# Patient Record
Sex: Male | Born: 1945
Health system: Southern US, Community
[De-identification: ages and names within clinical notes are randomized; demographics above are authoritative.]

## PROBLEM LIST (undated history)

## (undated) DIAGNOSIS — Z9889 Other specified postprocedural states: Secondary | ICD-10-CM

## (undated) DIAGNOSIS — Z860100 Personal history of colon polyps, unspecified: Secondary | ICD-10-CM

## (undated) DIAGNOSIS — I10 Essential (primary) hypertension: Secondary | ICD-10-CM

## (undated) DIAGNOSIS — N400 Enlarged prostate without lower urinary tract symptoms: Secondary | ICD-10-CM

## (undated) DIAGNOSIS — E039 Hypothyroidism, unspecified: Secondary | ICD-10-CM

## (undated) DIAGNOSIS — F419 Anxiety disorder, unspecified: Secondary | ICD-10-CM

## (undated) DIAGNOSIS — E786 Lipoprotein deficiency: Secondary | ICD-10-CM

## (undated) DIAGNOSIS — I4891 Unspecified atrial fibrillation: Secondary | ICD-10-CM

## (undated) DIAGNOSIS — K645 Perianal venous thrombosis: Secondary | ICD-10-CM

## (undated) DIAGNOSIS — N200 Calculus of kidney: Secondary | ICD-10-CM

## (undated) HISTORY — DX: Perianal venous thrombosis: K64.5

## (undated) HISTORY — DX: Unspecified atrial fibrillation: I48.91

## (undated) HISTORY — DX: Anxiety disorder, unspecified: F41.9

## (undated) HISTORY — DX: Benign prostatic hyperplasia without lower urinary tract symptoms: N40.0

## (undated) HISTORY — DX: Personal history of colonic polyps: Z86.010

## (undated) HISTORY — DX: Hypothyroidism, unspecified: E03.9

## (undated) HISTORY — DX: Lipoprotein deficiency: E78.6

## (undated) HISTORY — DX: Calculus of kidney: N20.0

## (undated) HISTORY — DX: Other specified postprocedural states: Z98.890

## (undated) HISTORY — DX: Personal history of colon polyps, unspecified: Z86.0100

## (undated) HISTORY — DX: Essential (primary) hypertension: I10

## (undated) HISTORY — PX: APPENDECTOMY: SHX54

---
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
## 1988-09-07 HISTORY — PX: OTHER SURGICAL HISTORY: SHX169

## 1988-09-07 HISTORY — PX: THYROIDECTOMY, PARTIAL: SHX18

## 1988-09-07 HISTORY — PX: INGUINAL HERNIA REPAIR: SHX194

## 2008-02-03 ENCOUNTER — Encounter: Payer: Self-pay | Admitting: Cardiology

## 2008-02-24 ENCOUNTER — Encounter: Payer: Self-pay | Admitting: Cardiology

## 2008-03-02 ENCOUNTER — Encounter: Payer: Self-pay | Admitting: Cardiology

## 2008-03-08 ENCOUNTER — Encounter: Payer: Self-pay | Admitting: Cardiology

## 2009-05-17 ENCOUNTER — Ambulatory Visit: Payer: Self-pay | Admitting: Internal Medicine

## 2009-05-17 DIAGNOSIS — Z860100 Personal history of colon polyps, unspecified: Secondary | ICD-10-CM | POA: Insufficient documentation

## 2009-05-17 DIAGNOSIS — E039 Hypothyroidism, unspecified: Secondary | ICD-10-CM | POA: Insufficient documentation

## 2009-05-17 DIAGNOSIS — I10 Essential (primary) hypertension: Secondary | ICD-10-CM | POA: Insufficient documentation

## 2009-05-17 DIAGNOSIS — N400 Enlarged prostate without lower urinary tract symptoms: Secondary | ICD-10-CM | POA: Insufficient documentation

## 2009-05-17 DIAGNOSIS — F40243 Fear of flying: Secondary | ICD-10-CM | POA: Insufficient documentation

## 2009-05-17 LAB — CONVERTED CEMR LAB
ALT: 18 units/L (ref 0–53)
Albumin: 3.9 g/dL (ref 3.5–5.2)
Alkaline Phosphatase: 62 units/L (ref 39–117)
BUN: 17 mg/dL (ref 6–23)
Basophils Relative: 0.5 % (ref 0.0–3.0)
CO2: 32 meq/L (ref 19–32)
Calcium: 9.5 mg/dL (ref 8.4–10.5)
GFR calc non Af Amer: 79.96 mL/min (ref >60)
HDL: 29.6 mg/dL — ABNORMAL LOW (ref >39.00)
Hemoglobin: 16.9 g/dL (ref 13.0–17.0)
Lymphocytes Relative: 24.2 % (ref 12.0–46.0)
Monocytes Relative: 10.1 % (ref 3.0–12.0)
Neutrophils Relative %: 61.5 % (ref 43.0–77.0)
PSA: 1.9 ng/mL (ref 0.10–4.00)
RDW: 11.8 % (ref 11.5–14.6)
Specific Gravity, Urine: 1.015 (ref 1.000–1.030)
TSH: 1.33 microintl units/mL (ref 0.35–5.50)
Total CHOL/HDL Ratio: 5
Total Protein: 7.1 g/dL (ref 6.0–8.3)
Triglycerides: 135 mg/dL (ref 0.0–149.0)
pH: 7 (ref 5.0–8.0)

## 2009-05-20 ENCOUNTER — Encounter: Payer: Self-pay | Admitting: Internal Medicine

## 2009-05-27 ENCOUNTER — Ambulatory Visit: Payer: Self-pay | Admitting: Internal Medicine

## 2009-07-27 ENCOUNTER — Encounter: Payer: Self-pay | Admitting: Internal Medicine

## 2009-08-06 ENCOUNTER — Ambulatory Visit: Payer: Self-pay | Admitting: Internal Medicine

## 2009-08-06 DIAGNOSIS — K645 Perianal venous thrombosis: Secondary | ICD-10-CM | POA: Insufficient documentation

## 2009-09-12 ENCOUNTER — Encounter (INDEPENDENT_AMBULATORY_CARE_PROVIDER_SITE_OTHER): Payer: Self-pay | Admitting: *Deleted

## 2009-09-13 ENCOUNTER — Ambulatory Visit: Payer: Self-pay | Admitting: Gastroenterology

## 2009-10-10 ENCOUNTER — Telehealth: Payer: Self-pay | Admitting: Gastroenterology

## 2009-10-22 ENCOUNTER — Ambulatory Visit: Payer: Self-pay | Admitting: Internal Medicine

## 2009-10-25 ENCOUNTER — Ambulatory Visit: Payer: Self-pay

## 2009-10-25 ENCOUNTER — Encounter: Payer: Self-pay | Admitting: Internal Medicine

## 2009-11-04 ENCOUNTER — Telehealth (INDEPENDENT_AMBULATORY_CARE_PROVIDER_SITE_OTHER): Payer: Self-pay | Admitting: *Deleted

## 2009-11-05 ENCOUNTER — Ambulatory Visit: Payer: Self-pay | Admitting: Gastroenterology

## 2009-11-05 LAB — HM COLONOSCOPY

## 2009-11-08 ENCOUNTER — Encounter: Payer: Self-pay | Admitting: Gastroenterology

## 2009-12-02 ENCOUNTER — Telehealth (INDEPENDENT_AMBULATORY_CARE_PROVIDER_SITE_OTHER): Payer: Self-pay | Admitting: *Deleted

## 2009-12-03 ENCOUNTER — Ambulatory Visit: Payer: Self-pay | Admitting: Internal Medicine

## 2009-12-03 DIAGNOSIS — I480 Paroxysmal atrial fibrillation: Secondary | ICD-10-CM | POA: Insufficient documentation

## 2009-12-03 LAB — CONVERTED CEMR LAB
Specific Gravity, Urine: 1.02 (ref 1.000–1.030)
TSH: 1.56 microintl units/mL (ref 0.35–5.50)
Urobilinogen, UA: 0.2 (ref 0.0–1.0)
pH: 7 (ref 5.0–8.0)

## 2009-12-31 ENCOUNTER — Ambulatory Visit: Payer: Self-pay | Admitting: Cardiology

## 2009-12-31 ENCOUNTER — Telehealth (INDEPENDENT_AMBULATORY_CARE_PROVIDER_SITE_OTHER): Payer: Self-pay | Admitting: *Deleted

## 2009-12-31 DIAGNOSIS — F17200 Nicotine dependence, unspecified, uncomplicated: Secondary | ICD-10-CM | POA: Insufficient documentation

## 2010-01-01 ENCOUNTER — Telehealth (INDEPENDENT_AMBULATORY_CARE_PROVIDER_SITE_OTHER): Payer: Self-pay | Admitting: *Deleted

## 2010-01-09 ENCOUNTER — Telehealth (INDEPENDENT_AMBULATORY_CARE_PROVIDER_SITE_OTHER): Payer: Self-pay | Admitting: *Deleted

## 2010-01-13 ENCOUNTER — Telehealth: Payer: Self-pay | Admitting: Cardiology

## 2010-01-16 ENCOUNTER — Ambulatory Visit: Payer: Self-pay

## 2010-01-16 ENCOUNTER — Ambulatory Visit (HOSPITAL_COMMUNITY): Admission: RE | Admit: 2010-01-16 | Discharge: 2010-01-16 | Payer: Self-pay | Admitting: Cardiology

## 2010-01-16 ENCOUNTER — Encounter: Payer: Self-pay | Admitting: Cardiology

## 2010-01-16 ENCOUNTER — Ambulatory Visit: Payer: Self-pay | Admitting: Internal Medicine

## 2010-01-17 ENCOUNTER — Ambulatory Visit: Payer: Self-pay | Admitting: Cardiology

## 2010-02-06 ENCOUNTER — Ambulatory Visit: Payer: Self-pay | Admitting: Internal Medicine

## 2010-02-06 LAB — CONVERTED CEMR LAB
Calcium: 9.1 mg/dL (ref 8.4–10.5)
Chloride: 107 meq/L (ref 96–112)
Eosinophils Relative: 4.9 % (ref 0.0–5.0)
GFR calc non Af Amer: 93.69 mL/min (ref >60)
Lymphs Abs: 1.9 10*3/uL (ref 0.7–4.0)
MCHC: 35.2 g/dL (ref 30.0–36.0)
Monocytes Relative: 8.6 % (ref 3.0–12.0)
Neutro Abs: 4 10*3/uL (ref 1.4–7.7)
Neutrophils Relative %: 58.4 % (ref 43.0–77.0)
Platelets: 195 10*3/uL (ref 150.0–400.0)
Potassium: 3.9 meq/L (ref 3.5–5.1)
TSH: 1.51 microintl units/mL (ref 0.35–5.50)
WBC: 6.9 10*3/uL (ref 4.5–10.5)

## 2010-02-12 ENCOUNTER — Ambulatory Visit (HOSPITAL_COMMUNITY): Admission: RE | Admit: 2010-02-12 | Discharge: 2010-02-12 | Payer: Self-pay | Admitting: Cardiology

## 2010-02-12 ENCOUNTER — Ambulatory Visit: Payer: Self-pay | Admitting: Cardiovascular Disease

## 2010-02-12 ENCOUNTER — Ambulatory Visit: Payer: Self-pay

## 2010-02-12 ENCOUNTER — Encounter: Payer: Self-pay | Admitting: Cardiology

## 2010-02-25 ENCOUNTER — Telehealth: Payer: Self-pay | Admitting: Cardiology

## 2010-04-14 ENCOUNTER — Ambulatory Visit: Payer: Self-pay | Admitting: Cardiology

## 2010-04-14 DIAGNOSIS — I498 Other specified cardiac arrhythmias: Secondary | ICD-10-CM | POA: Insufficient documentation

## 2010-04-21 ENCOUNTER — Ambulatory Visit: Payer: Self-pay | Admitting: Cardiology

## 2010-04-22 LAB — CONVERTED CEMR LAB
BUN: 17 mg/dL (ref 6–23)
Chloride: 103 meq/L (ref 96–112)
Creatinine, Ser: 1 mg/dL (ref 0.4–1.5)
Potassium: 3.8 meq/L (ref 3.5–5.1)

## 2010-06-24 ENCOUNTER — Telehealth: Payer: Self-pay | Admitting: Internal Medicine

## 2010-07-11 ENCOUNTER — Ambulatory Visit: Payer: Self-pay | Admitting: Internal Medicine

## 2010-07-11 DIAGNOSIS — R7303 Prediabetes: Secondary | ICD-10-CM | POA: Insufficient documentation

## 2010-07-11 LAB — CONVERTED CEMR LAB
ALT: 20 units/L (ref 0–53)
AST: 23 units/L (ref 0–37)
Alkaline Phosphatase: 67 units/L (ref 39–117)
Basophils Absolute: 0 10*3/uL (ref 0.0–0.1)
Bilirubin, Direct: 0.1 mg/dL (ref 0.0–0.3)
CO2: 31 meq/L (ref 19–32)
Calcium: 9.2 mg/dL (ref 8.4–10.5)
Glucose, Bld: 123 mg/dL — ABNORMAL HIGH (ref 70–99)
HCT: 50.5 % (ref 39.0–52.0)
Hemoglobin: 17.6 g/dL — ABNORMAL HIGH (ref 13.0–17.0)
Lymphocytes Relative: 21.7 % (ref 12.0–46.0)
Lymphs Abs: 1.8 10*3/uL (ref 0.7–4.0)
MCV: 91.9 fL (ref 78.0–100.0)
Monocytes Relative: 10.2 % (ref 3.0–12.0)
Neutro Abs: 5.4 10*3/uL (ref 1.4–7.7)
RBC: 5.49 M/uL (ref 4.22–5.81)
Sodium: 140 meq/L (ref 135–145)
Total Bilirubin: 1.3 mg/dL — ABNORMAL HIGH (ref 0.3–1.2)

## 2010-07-14 ENCOUNTER — Telehealth: Payer: Self-pay | Admitting: Internal Medicine

## 2010-07-14 ENCOUNTER — Ambulatory Visit: Payer: Self-pay | Admitting: Internal Medicine

## 2010-07-16 ENCOUNTER — Ambulatory Visit: Payer: Self-pay | Admitting: Internal Medicine

## 2010-08-11 ENCOUNTER — Ambulatory Visit: Payer: Self-pay | Admitting: Internal Medicine

## 2010-08-22 ENCOUNTER — Encounter: Payer: Self-pay | Admitting: Internal Medicine

## 2010-08-22 LAB — CONVERTED CEMR LAB
BUN: 18 mg/dL (ref 6–23)
Chloride: 107 meq/L (ref 96–112)
Cholesterol: 146 mg/dL (ref 0–200)
LDL Cholesterol: 93 mg/dL (ref 0–99)
Potassium: 4.6 meq/L (ref 3.5–5.1)
Sodium: 141 meq/L (ref 135–145)
Total CHOL/HDL Ratio: 5

## 2010-10-07 NOTE — Letter (Signed)
Summary: Results Follow-up Letter  [facility]  [address] [place], [place] [address]   Phone: [phone]  Fax: [phone]    07/11/2010  [address] [place], [place]  [address]  Dear Mr. SERVISS,   The following are the results of your recent test(s):  Test     Result     Blood sugars   early type II diabetes Liver/kidney   normal Thyroid     normal CBC       normal   _________________________________________________________  Please call for an appointment soon _________________________________________________________ _________________________________________________________ _________________________________________________________  Sincerely,  Sanda Linger MD [facility]

## 2010-10-07 NOTE — Progress Notes (Signed)
  Phone Note Other Incoming   Caller: Merita Norton, [facility] [address] Request: Send information Summary of Call: Received a call from [place], [facility] inquiring about our process for record retrieval from other facilities. I advised her of the process after which she asked if I could get the records. If I couldn't she would get Selena Batten to do it. I advised her that I would take care of it. I called the Doctor's office; however, the medical record department closed at 4.  I have spoken to both [place] and [facility] about this.      Appended Document:  I called the [facility] at [phone] and left a message this morning for a return call.  Appended Document:  Received a return call from [facility]. She stated that after checking the patient's EMR, she does not show that the release was ever received so it would not have been processed. I have refaxed the release form to [phone] per the request of Joann. Chyrl Civatte states that she can only send the last 3 ov's and labs.

## 2010-10-07 NOTE — Letter (Signed)
Summary: Patient Notice- Polyp Results  [facility]  [address] [place], [place] [address]   Phone: [phone]  Fax: [phone]        November 08, 2009 MRN: [id number]    Kevin Bradley [address] [place], [place]  [address]    Dear [name],  I am pleased to inform you that the colon polyp(s) removed during your recent colonoscopy was (were) found to be benign (no cancer detected) upon pathologic examination.  I recommend you have a repeat colonoscopy examination in 5_ years to look for recurrent polyps, as having colon polyps increases your risk for having recurrent polyps or even colon cancer in the future.  Should you develop new or worsening symptoms of abdominal pain, bowel habit changes or bleeding from the rectum or bowels, please schedule an evaluation with either your primary care physician or with me.  Additional information/recommendations:  __ No further action with gastroenterology is needed at this time. Please      follow-up with your primary care physician for your other healthcare      needs.  __ Please call [phone] to schedule a return visit to review your      situation.  __ Please keep your follow-up visit as already scheduled.  _x_ Continue treatment plan as outlined the day of your exam.  Please call us if you are having persistent problems or have questions about your condition that have not been fully answered at this time.  Sincerely,  Louis Meckel MD  This letter has been electronically signed by your physician.  Appended Document: Patient Notice- Polyp Results Letter mailed 3.7.11

## 2010-10-07 NOTE — Progress Notes (Signed)
Summary: Records received from Dr. Remus Blake  Records received from Dr. Carney Bern, [facility]. Forwarded to Dr. Yetta Barre for review. Kevin Bradley  January 01, 2010 12:29 PM

## 2010-10-07 NOTE — Letter (Signed)
Summary: Primary Physician Clearance/[facility]  Primary Physician Clearance/[facility]   Imported By: Sherian Rein 07/17/2010 10:46:32  _____________________________________________________________________  External Attachment:    Type:   Image     Comment:   External Document

## 2010-10-07 NOTE — Progress Notes (Signed)
  Faxed all Cardiac over to Joycelyn Schmid w/ [employer] to fax [phone] [facility]  December 02, 2009 9:40 AM

## 2010-10-07 NOTE — Assessment & Plan Note (Signed)
Summary: f/u app per wife/cd   Vital Signs:  Patient profile:   64 year old male Height:      72 inches Weight:      199 pounds BMI:     27.09 O2 Sat:      96 % on Room air Temp:     97.0 degrees F oral Pulse rate:   48 / minute Pulse rhythm:   regular Resp:     16 per minute BP sitting:   140 / 86  (left arm) Cuff size:   large  Vitals Entered By: Rock Nephew CMA (December 03, 2009 8:23 AM)  Nutrition Counseling: Patient's BMI is greater than 25 and therefore counseled on weight management options.  O2 Flow:  Room air CC: follow-up visit// discuss test results Is Patient Diabetic? No Pain Assessment Patient in pain? no        Primary Care Provider:  Etta Grandchild MD  CC:  follow-up visit// discuss test results.  History of Present Illness: 1. he returns for f/up on palpitations, he had an event monitor read on 10/25/09 that showed some A. fib., this report was not copied to me, he still has "skipped and irregular beats" but no CP, SOB, DOE, near-syncope, or edema. His palpitations have not recently worsened.  2. he has a 3 week hx. of urinary frequency  Preventive Screening-Counseling & Management  Alcohol-Tobacco     Alcohol drinks/day: 0     Smoking Status: current     Smoking Cessation Counseling: yes     Cigars/week: 14  Hep-HIV-STD-Contraception     Hepatitis Risk: no risk noted     HIV Risk: no risk noted     STD Risk: no risk noted      Sexual History:  currently monogamous.        Drug Use:  never.        Blood Transfusions:  no.    Clinical Review Panels:  Prevention   Last Colonoscopy:  DONE (11/05/2009)   Last PSA:  1.90 (05/17/2009)   Medications Prior to Update: 1)  Tiazac 180 Mg Xr24h-Cap (Diltiazem Hcl Er Beads) .... Take 1 Tablet By Mouth Once A Day 2)  Atenolol 50 Mg Tabs (Atenolol) .... Take 1/2  Tablet By Mouth Two Times A Day 3)  Synthroid 50 Mcg Tabs (Levothyroxine Sodium) .... Take 1 Tablet By Mouth Once A Day 4)  Fish  Oil 5)  Vitamin D .... 2 Qd 6)  Asa 81mg  .... Take 1 Tablet By Mouth Once A Day 7)  Tussionex Pennkinetic Er 8-10 Mg/35ml Lqcr (Chlorpheniramine-Hydrocodone) .... 5 Ml By Mouth Two Times A Day As Needed For Cough  Current Medications (verified): 1)  Tiazac 180 Mg Xr24h-Cap (Diltiazem Hcl Er Beads) .... Take 1 Tablet By Mouth Once A Day 2)  Atenolol 50 Mg Tabs (Atenolol) .... Take 1/2  Tablet By Mouth Two Times A Day 3)  Synthroid 50 Mcg Tabs (Levothyroxine Sodium) .... Take 1 Tablet By Mouth Once A Day 4)  Fish Oil 5)  Vitamin D .... 2 Qd 6)  Asa 81mg  .... Take 1 Tablet By Mouth Once A Day 7)  Ciprofloxacin Hcl 500 Mg Tabs (Ciprofloxacin Hcl) .... One By Mouth Two Times A Day For 30 Days  Allergies (verified): 1)  ! Penicillin  Past History:  Past Medical History: Reviewed history from 10/22/2009 and no changes required. Irregular heart rate with a normal ETT one year ago in [place] Anxiety Hypothyroidism Benign prostatic hypertrophy Hypertension  Colonic polyps, hx of  Past Surgical History: Reviewed history from 05/17/2009 and no changes required. Inguinal herniorrhaphy Tonsillectomy  Family History: Reviewed history from 05/17/2009 and no changes required. Family History of Alcoholism/Addiction Family History Breast cancer 1st degree relative <50  Social History: Reviewed history from 05/17/2009 and no changes required. Occupation: truck Hospital doctor Married Current Smoker Alcohol use-yes Drug use-no Regular exercise-yes Hepatitis Risk:  no risk noted HIV Risk:  no risk noted STD Risk:  no risk noted Sexual History:  currently monogamous Drug Use:  never Blood Transfusions:  no  Review of Systems  The patient denies anorexia, fever, weight loss, weight gain, chest pain, peripheral edema, prolonged cough, headaches, hemoptysis, abdominal pain, hematuria, incontinence, genital sores, suspicious skin lesions, depression, enlarged lymph nodes, and testicular masses.    CV:  Complains of palpitations; denies chest pain or discomfort, difficulty breathing at night, fainting, fatigue, leg cramps with exertion, near fainting, shortness of breath with exertion, swelling of feet, swelling of hands, and weight gain. GU:  Complains of nocturia and urinary frequency; denies dysuria, erectile dysfunction, genital sores, hematuria, incontinence, and urinary hesitancy.  Physical Exam  General:  alert, well-developed, well-nourished, well-hydrated, appropriate dress, normal appearance, healthy-appearing, and cooperative to examination.   Mouth:  Oral mucosa and oropharynx without lesions or exudates.  Teeth in good repair. Neck:  supple, full ROM, no masses, no thyromegaly, normal carotid upstroke, and no carotid bruits.   Lungs:  Normal respiratory effort, chest expands symmetrically. Lungs are clear to auscultation, no crackles or wheezes. Heart:  regular rhythm, no murmur, no gallop, no rub, no JVD, and bradycardia.   Abdomen:  Bowel sounds positive,abdomen soft and non-tender without masses, organomegaly or hernias noted. abdominal scar(s).   Rectal:  external hemorrhoid,  right lateral area with thrombosis.  normal sphincter tone, no masses, no tenderness, no fissures, no fistulae, and external hemorrhoid(s).  heme negative stool. Genitalia:  circumcised, no hydrocele, no varicocele, no scrotal masses, no testicular masses or atrophy, no cutaneous lesions, and no urethral discharge.   Prostate:  no nodules, no asymmetry, no induration, boggy, and 1+ enlarged.   Msk:  normal ROM, no joint tenderness, no joint swelling, no joint warmth, no redness over joints, and no joint deformities.   Pulses:  R and L carotid,radial,femoral,dorsalis pedis and posterior tibial pulses are full and equal bilaterally Extremities:  No clubbing, cyanosis, edema, or deformity noted with normal full range of motion of all joints.   Neurologic:  alert & oriented X3, cranial nerves II-XII  intact, strength normal in all extremities, sensation intact to light touch, sensation intact to pinprick, gait normal, DTRs symmetrical and normal, and finger-to-nose normal.   Skin:  Intact without suspicious lesions or rashes Cervical Nodes:  no anterior cervical adenopathy and no posterior cervical adenopathy.   Axillary Nodes:  no R axillary adenopathy and no L axillary adenopathy.   Inguinal Nodes:  no R inguinal adenopathy and no L inguinal adenopathy.   Psych:  Cognition and judgment appear intact. Alert and cooperative with normal attention span and concentration. No apparent delusions, illusions, hallucinations Additional Exam:  EKG shows a HR=46 with 1st degree AV block and a flat T in I.   Impression & Recommendations:  Problem # 1:  PROSTATITIS, ACUTE (ICD-601.0) Assessment New start Cipro  Problem # 2:  FREQUENCY, URINARY (ICD-788.41) Assessment: New will look for RBC's, WBC's, protein, etc. Orders: TLB-Udip w/ Micro (81001-URINE)  Problem # 3:  ATRIAL FIBRILLATION (ICD-427.31) Assessment: New  His updated  medication list for this problem includes:    Tiazac 180 Mg Xr24h-cap (Diltiazem hcl er beads) .Marland Kitchen... Take 1 tablet by mouth once a day    Atenolol 50 Mg Tabs (Atenolol) .Marland Kitchen... Take 1/2  tablet by mouth two times a day  Orders: Cardiology Referral (Cardiology) EKG w/ Interpretation (93000)  Problem # 4:  HYPOTHYROIDISM (ICD-244.9) Assessment: Unchanged  His updated medication list for this problem includes:    Synthroid 50 Mcg Tabs (Levothyroxine sodium) .Marland Kitchen... Take 1 tablet by mouth once a day  Orders: Venipuncture ([address]) TLB-TSH (Thyroid Stimulating Hormone) (84443-TSH)  Labs Reviewed: TSH: 1.33 (05/17/2009)    Chol: 155 (05/17/2009)   HDL: 29.60 (05/17/2009)   LDL: 98 (05/17/2009)   TG: 135.0 (05/17/2009)  Problem # 5:  HYPERTENSION (ICD-401.9) Assessment: Unchanged  His updated medication list for this problem includes:    Tiazac 180 Mg Xr24h-cap  (Diltiazem hcl er beads) .Marland Kitchen... Take 1 tablet by mouth once a day    Atenolol 50 Mg Tabs (Atenolol) .Marland Kitchen... Take 1/2  tablet by mouth two times a day  BP today: 140/86 Prior BP: 138/80 (10/22/2009)  Labs Reviewed: K+: 4.3 (05/17/2009) Creat: : 1.0 (05/17/2009)   Chol: 155 (05/17/2009)   HDL: 29.60 (05/17/2009)   LDL: 98 (05/17/2009)   TG: 135.0 (05/17/2009)  Complete Medication List: 1)  Tiazac 180 Mg Xr24h-cap (Diltiazem hcl er beads) .... Take 1 tablet by mouth once a day 2)  Atenolol 50 Mg Tabs (Atenolol) .... Take 1/2  tablet by mouth two times a day 3)  Synthroid 50 Mcg Tabs (Levothyroxine sodium) .... Take 1 tablet by mouth once a day 4)  Fish Oil  5)  Vitamin D  .... 2 qd 6)  Asa 81mg   .... Take 1 tablet by mouth once a day 7)  Ciprofloxacin Hcl 500 Mg Tabs (Ciprofloxacin hcl) .... One by mouth two times a day for 30 days  Patient Instructions: 1)  Please schedule a follow-up appointment in 1 month. 2)  Take your antibiotic as prescribed until ALL of it is gone, but stop if you develop a rash or swelling and contact our office as soon as possible. Prescriptions: CIPROFLOXACIN HCL 500 MG TABS (CIPROFLOXACIN HCL) One by mouth two times a day for 30 days  #60 x 1   Entered and Authorized by:   Etta Grandchild MD   Signed by:   Etta Grandchild MD on 12/03/2009   Method used:   Electronically to        [facility] [address] [phone]* (retail)       [address]       [place], [place]  [address]       Ph: [phone] or [phone]       Fax: [phone]   RxID:   [id number]    Immunization History:  Influenza Immunization History:    Influenza:  historical (06/07/2009)

## 2010-10-07 NOTE — Assessment & Plan Note (Signed)
Summary: dot wants bp monitored and clearance signed-lb   Vital Signs:  Patient profile:   65 year old male Height:      72 inches Weight:      202 pounds BMI:     27.50 O2 Sat:      95 % on Room air Temp:     98.5 degrees F oral Pulse rate:   55 / minute Pulse rhythm:   regular Resp:     16 per minute BP sitting:   156 / 92  (left arm) Cuff size:   large  Vitals Entered By: Rock Nephew CMA (July 11, 2010 3:40 PM)  Nutrition Counseling: Patient's BMI is greater than 25 and therefore counseled on weight management options.  O2 Flow:  Room air CC: follow-up visit//discuss BP Is Patient Diabetic? No Pain Assessment Patient in pain? no       Does patient need assistance? Functional Status Self care Ambulation Normal   Primary Care Provider:  Etta Grandchild MD  CC:  follow-up visit//discuss BP.  History of Present Illness:  Hypertension Follow-Up      This is a 65 year old man who presents for Hypertension follow-up.  The patient denies lightheadedness, urinary frequency, headaches, and edema.  The patient denies the following associated symptoms: chest pain, chest pressure, exercise intolerance, dyspnea, palpitations, syncope, leg edema, and pedal edema.  Compliance with medications (by patient report) has been near 100%.  The patient reports that dietary compliance has been fair.  The patient reports no exercise.  Adjunctive measures currently used by the patient include salt restriction and relaxation.    Preventive Screening-Counseling & Management  Alcohol-Tobacco     Alcohol drinks/day: 0     Alcohol Counseling: not indicated; patient does not drink     Smoking Status: current     Smoking Cessation Counseling: yes     Smoke Cessation Stage: precontemplative     Packs/Day: 0.75     Cigars/week: 14     Tobacco Counseling: to quit use of tobacco products  Hep-HIV-STD-Contraception     Hepatitis Risk: no risk noted     HIV Risk: no risk noted     STD  Risk: no risk noted      Sexual History:  currently monogamous.        Drug Use:  never.        Blood Transfusions:  no.    Clinical Review Panels:  Prevention   Last Colonoscopy:  DONE (11/05/2009)   Last PSA:  1.90 (05/17/2009)  Immunizations   Last Flu Vaccine:  Historical (06/07/2009)  Lipid Management   Cholesterol:  155 (05/17/2009)   LDL (bad choesterol):  98 (05/17/2009)   HDL (good cholesterol):  29.60 (05/17/2009)  Diabetes Management   Creatinine:  1.0 (04/21/2010)   Last Flu Vaccine:  Historical (06/07/2009)  CBC   WBC:  6.9 (02/06/2010)   RBC:  5.29 (02/06/2010)   Hgb:  17.0 (02/06/2010)   Hct:  48.2 (02/06/2010)   Platelets:  195.0 (02/06/2010)   MCV  91.1 (02/06/2010)   MCHC  35.2 (02/06/2010)   RDW  12.7 (02/06/2010)   PMN:  58.4 (02/06/2010)   Lymphs:  27.5 (02/06/2010)   Monos:  8.6 (02/06/2010)   Eosinophils:  4.9 (02/06/2010)   Basophil:  0.6 (02/06/2010)  Complete Metabolic Panel   Glucose:  112 (04/21/2010)   Sodium:  141 (04/21/2010)   Potassium:  3.8 (04/21/2010)   Chloride:  103 (04/21/2010)   CO2:  27 (04/21/2010)   BUN:  17 (04/21/2010)   Creatinine:  1.0 (04/21/2010)   Albumin:  3.9 (05/17/2009)   Total Protein:  7.1 (05/17/2009)   Calcium:  9.2 (04/21/2010)   Total Bili:  1.2 (05/17/2009)   Alk Phos:  62 (05/17/2009)   SGPT (ALT):  18 (05/17/2009)   SGOT (AST):  17 (05/17/2009)   Medications Prior to Update: 1)  Tiazac 180 Mg Xr24h-Cap (Diltiazem Hcl Er Beads) .... Take 1 Tablet By Mouth Once A Day 2)  Atenolol 50 Mg Tabs (Atenolol) .... Take 1 Tablet By Mouth Once Daily 3)  Synthroid 50 Mcg Tabs (Levothyroxine Sodium) .... Take 1 Tablet By Mouth Once A Day 4)  Fish Oil   Oil (Fish Oil) .Marland Kitchen.. 1  Tab By Mouth Once Daily 5)  Vitamin D .... 2 Qd 6)  Aspirin Ec 325 Mg Tbec (Aspirin) .... Take One Tablet By Mouth Daily 7)  Tambocor 50 Mg Tabs (Flecainide Acetate) .... 2 Tab By Mouth Once Daily 8)  Lisinopril 10 Mg Tabs  (Lisinopril) .... Take One Tablet By Mouth Daily  Current Medications (verified): 1)  Tiazac 180 Mg Xr24h-Cap (Diltiazem Hcl Er Beads) .... Take 1 Tablet By Mouth Once A Day 2)  Synthroid 50 Mcg Tabs (Levothyroxine Sodium) .... Take 1 Tablet By Mouth Once A Day 3)  Fish Oil   Oil (Fish Oil) .Marland Kitchen.. 1  Tab By Mouth Once Daily 4)  Vitamin D .... 2 Qd 5)  Aspirin Ec 325 Mg Tbec (Aspirin) .... Take One Tablet By Mouth Daily 6)  Tambocor 50 Mg Tabs (Flecainide Acetate) .... 2 Tab By Mouth Once Daily 7)  Bystolic 10 Mg Tabs (Nebivolol Hcl) .... One By Mouth Once Daily For High Blood Pressure 8)  Diovan 160 Mg Tabs (Valsartan) .... One By Mouth Once Daily For High Blood Pressure  Allergies (verified): 1)  ! Penicillin  Past History:  Past Medical History: Last updated: 12/31/2009 ATRIAL FIBRILLATION (ICD-427.31) HYPERTENSION (ICD-401.9) EXTERNAL THROMBOSED HEMORRHOIDS (ICD-455.4) COLONIC POLYPS, HX OF (ICD-V12.72) BENIGN PROSTATIC HYPERTROPHY (ICD-600.00) HYPOTHYROIDISM (ICD-244.9) ANXIETY (ICD-300.00) Low HDL Nephrolithiasis  Past Surgical History: Last updated: 12/31/2009 Inguinal herniorrhaphy Tonsillectomy Partial thyroidectomy Appendectomy  Family History: Last updated: 12/31/2009 Family History of Alcoholism/Addiction Family History Breast cancer 1st degree relative <50 Father died of AAA; MI at age 48  Social History: Last updated: 12/31/2009 Occupation: truck driver Married Current Smoker - 2 cigars per day Alcohol use-none Drug use-no Regular exercise-yes  Risk Factors: Alcohol Use: 0 (07/11/2010) Exercise: yes (05/17/2009)  Risk Factors: Smoking Status: current (07/11/2010) Packs/Day: 0.75 (07/11/2010) Cigars/wk: 14 (07/11/2010)  Family History: Reviewed history from 12/31/2009 and no changes required. Family History of Alcoholism/Addiction Family History Breast cancer 1st degree relative <50 Father died of AAA; MI at age 72  Social History: Reviewed  history from 12/31/2009 and no changes required. Occupation: truck Hospital doctor Married Current Smoker - 2 cigars per day Alcohol use-none Drug use-no Regular exercise-yes  Review of Systems       The patient complains of weight gain.  The patient denies anorexia, fever, weight loss, chest pain, syncope, dyspnea on exertion, peripheral edema, prolonged cough, headaches, hemoptysis, abdominal pain, hematuria, suspicious skin lesions, difficulty walking, and depression.   CV:  Denies chest pain or discomfort, fainting, fatigue, leg cramps with exertion, lightheadness, near fainting, palpitations, shortness of breath with exertion, and swelling of feet. Endo:  Denies cold intolerance, excessive hunger, excessive thirst, excessive urination, heat intolerance, polyuria, and weight change.  Physical Exam  General:  alert, well-developed, well-nourished, well-hydrated, appropriate dress, normal appearance, healthy-appearing, cooperative to examination, and good hygiene.   Mouth:  Oral mucosa and oropharynx without lesions or exudates.  Teeth in good repair. Neck:  Over his right lateral, lower neck there is a discreet sub-q mass that measures about 3 cm that feels like a lymph node. It feels fixed but is non-tender, non-fluctuant, and non-indurated. the overlying skin is normal. Lungs:  normal respiratory effort, no intercostal retractions, no accessory muscle use, normal breath sounds, no dullness, no fremitus, no crackles, and no wheezes.   Heart:  normal rate, regular rhythm, no murmur, no gallop, no rub, and no JVD.   Abdomen:  soft, non-tender, normal bowel sounds, no distention, no masses, no guarding, no rigidity, no rebound tenderness, no abdominal hernia, no inguinal hernia, no hepatomegaly, and no splenomegaly.   Msk:  normal ROM, no joint tenderness, no joint swelling, no joint warmth, no redness over joints, and no joint deformities.   Pulses:  R and L carotid,radial,femoral,dorsalis pedis and  posterior tibial pulses are full and equal bilaterally Extremities:  No clubbing, cyanosis, edema, or deformity noted with normal full range of motion of all joints.   Neurologic:  alert & oriented X3, cranial nerves II-XII intact, strength normal in all extremities, sensation intact to light touch, sensation intact to pinprick, gait normal, DTRs symmetrical and normal, and finger-to-nose normal.   Skin:  turgor normal, no rashes, no suspicious lesions, no ecchymoses, no petechiae, no purpura, no ulcerations, no edema, excessive tan, solar damage, and seborrheic keratosis.   Cervical Nodes:  no anterior cervical adenopathy and no posterior cervical adenopathy.   Psych:  Oriented X3, memory intact for recent and remote, normally interactive, good eye contact, not anxious appearing, not depressed appearing, not agitated, and not suicidal.     Impression & Recommendations:  Problem # 1:  HYPERGLYCEMIA (ICD-790.29) Assessment New  Orders: Venipuncture (91478) TLB-BMP (Basic Metabolic Panel-BMET) (80048-METABOL) TLB-CBC Platelet - w/Differential (85025-CBCD) TLB-Hepatic/Liver Function Pnl (80076-HEPATIC) TLB-TSH (Thyroid Stimulating Hormone) (84443-TSH) TLB-A1C / Hgb A1C (Glycohemoglobin) (83036-A1C)  Problem # 2:  BRADYCARDIA (ICD-427.89) Assessment: Unchanged  The following medications were removed from the medication list:    Atenolol 50 Mg Tabs (Atenolol) .Marland Kitchen... Take 1/2 tablet by mouth once daily His updated medication list for this problem includes:    Aspirin Ec 325 Mg Tbec (Aspirin) .Marland Kitchen... Take one tablet by mouth daily    Tambocor 50 Mg Tabs (Flecainide acetate) .Marland Kitchen... 2 tab by mouth once daily    Bystolic 10 Mg Tabs (Nebivolol hcl) ..... One by mouth once daily for high blood pressure  Problem # 3:  ATRIAL FIBRILLATION (ICD-427.31) Assessment: Improved  The following medications were removed from the medication list:    Atenolol 50 Mg Tabs (Atenolol) .Marland Kitchen... Take 1/2 tablet by  mouth once daily His updated medication list for this problem includes:    Tiazac 180 Mg Xr24h-cap (Diltiazem hcl er beads) .Marland Kitchen... Take 1 tablet by mouth once a day    Aspirin Ec 325 Mg Tbec (Aspirin) .Marland Kitchen... Take one tablet by mouth daily    Tambocor 50 Mg Tabs (Flecainide acetate) .Marland Kitchen... 2 tab by mouth once daily    Bystolic 10 Mg Tabs (Nebivolol hcl) ..... One by mouth once daily for high blood pressure  Problem # 4:  HYPERTENSION (ICD-401.9) Assessment: Deteriorated  The following medications were removed from the medication list:    Atenolol 50 Mg Tabs (Atenolol) .Marland Kitchen... Take 1/2 tablet by mouth once daily  Lisinopril 10 Mg Tabs (Lisinopril) .Marland Kitchen... Take one tablet by mouth daily His updated medication list for this problem includes:    Tiazac 180 Mg Xr24h-cap (Diltiazem hcl er beads) .Marland Kitchen... Take 1 tablet by mouth once a day    Bystolic 10 Mg Tabs (Nebivolol hcl) ..... One by mouth once daily for high blood pressure    Diovan 160 Mg Tabs (Valsartan) ..... One by mouth once daily for high blood pressure  Orders: Venipuncture (04540) TLB-BMP (Basic Metabolic Panel-BMET) (80048-METABOL) TLB-CBC Platelet - w/Differential (85025-CBCD) TLB-Hepatic/Liver Function Pnl (80076-HEPATIC) TLB-TSH (Thyroid Stimulating Hormone) (84443-TSH) TLB-A1C / Hgb A1C (Glycohemoglobin) (83036-A1C)  BP today: 156/92 Prior BP: 151/83 (04/14/2010)  Prior 10 Yr Risk Heart Disease: 18 % (02/06/2010)  Labs Reviewed: K+: 3.8 (04/21/2010) Creat: : 1.0 (04/21/2010)   Chol: 155 (05/17/2009)   HDL: 29.60 (05/17/2009)   LDL: 98 (05/17/2009)   TG: 135.0 (05/17/2009)  Problem # 5:  HYPOTHYROIDISM (ICD-244.9) Assessment: Unchanged  His updated medication list for this problem includes:    Synthroid 50 Mcg Tabs (Levothyroxine sodium) .Marland Kitchen... Take 1 tablet by mouth once a day  Orders: Venipuncture (98119) TLB-BMP (Basic Metabolic Panel-BMET) (80048-METABOL) TLB-CBC Platelet - w/Differential  (85025-CBCD) TLB-Hepatic/Liver Function Pnl (80076-HEPATIC) TLB-TSH (Thyroid Stimulating Hormone) (84443-TSH) TLB-A1C / Hgb A1C (Glycohemoglobin) (83036-A1C)  Labs Reviewed: TSH: 1.51 (02/06/2010)    Chol: 155 (05/17/2009)   HDL: 29.60 (05/17/2009)   LDL: 98 (05/17/2009)   TG: 135.0 (05/17/2009)  Complete Medication List: 1)  Tiazac 180 Mg Xr24h-cap (Diltiazem hcl er beads) .... Take 1 tablet by mouth once a day 2)  Synthroid 50 Mcg Tabs (Levothyroxine sodium) .... Take 1 tablet by mouth once a day 3)  Fish Oil Oil (Fish oil) .Marland Kitchen.. 1  tab by mouth once daily 4)  Vitamin D  .... 2 qd 5)  Aspirin Ec 325 Mg Tbec (Aspirin) .... Take one tablet by mouth daily 6)  Tambocor 50 Mg Tabs (Flecainide acetate) .... 2 tab by mouth once daily 7)  Bystolic 10 Mg Tabs (Nebivolol hcl) .... One by mouth once daily for high blood pressure 8)  Diovan 160 Mg Tabs (Valsartan) .... One by mouth once daily for high blood pressure  Patient Instructions: 1)  Please schedule a follow-up appointment in 1 month. 2)  It is important that you exercise regularly at least 20 minutes 5 times a week. If you develop chest pain, have severe difficulty breathing, or feel very tired , stop exercising immediately and seek medical attention. 3)  You need to lose weight. Consider a lower calorie diet and regular exercise.  4)  Check your Blood Pressure regularly. If it is above 130/80: you should make an appointment. Prescriptions: DIOVAN 160 MG TABS (VALSARTAN) One by mouth once daily for high blood pressure  #112 x 0   Entered and Authorized by:   Etta Grandchild MD   Signed by:   Etta Grandchild MD on 07/11/2010   Method used:   Samples Given   RxID:   [id number] BYSTOLIC 10 MG TABS (NEBIVOLOL HCL) One by mouth once daily for high blood pressure  #70 x 0   Entered and Authorized by:   Etta Grandchild MD   Signed by:   Etta Grandchild MD on 07/11/2010   Method used:   Samples Given   RxID:    [id number]    Orders Added: 1)  Venipuncture [36415] 2)  TLB-BMP (Basic Metabolic Panel-BMET) [80048-METABOL] 3)  TLB-CBC Platelet - w/Differential [85025-CBCD] 4)  TLB-Hepatic/Liver  Function Pnl [80076-HEPATIC] 5)  TLB-TSH (Thyroid Stimulating Hormone) [84443-TSH] 6)  TLB-A1C / Hgb A1C (Glycohemoglobin) [83036-A1C] 7)  Est. Patient Level V [78469]

## 2010-10-07 NOTE — Assessment & Plan Note (Signed)
Summary: PT HAS HAD A HISTORY OF IRREGULAR HEART BEAT, PT HAVING PROBL...   Vital Signs:  Patient profile:   65 year old male Height:      72 inches Weight:      204 pounds BMI:     27.77 O2 Sat:      97 % on Room air Temp:     97.4 degrees F oral Pulse rate:   53 / minute Pulse rhythm:   regular Resp:     16 per minute BP sitting:   138 / 80  (left arm) Cuff size:   large  Vitals Entered By: Rock Nephew CMA (October 22, 2009 8:47 AM)  Nutrition Counseling: Patient's BMI is greater than 25 and therefore counseled on weight management options.  O2 Flow:  Room air  Primary Care Provider:  Etta Grandchild MD  CC:  URI symptoms and palpitations.  History of Present Illness:  URI Symptoms      This is a 65 year old man who presents with URI symptoms.  The symptoms began 2 weeks ago.  The severity is described as mild.  The patient reports productive cough, but denies sore throat, dry cough, earache, and sick contacts.  Associated symptoms include fever.  The patient denies stiff neck, dyspnea, wheezing, rash, vomiting, diarrhea, use of an antipyretic, and response to antipyretic.  The patient denies headache, muscle aches, and severe fatigue.  Risk factors for Strep sinusitis include poor response to decongestant.  The patient denies the following risk factors for Strep sinusitis: unilateral facial pain, unilateral nasal discharge, double sickening, tooth pain, Strep exposure, tender adenopathy, and absence of cough.         The patient also complains of palpitations.  The symptoms began 4-8 weeks ago.  The severity is described as moderate.  The patient denies [employer]-6`palpitations.  Palpitations described as skipping of the heart.  The symptoms occur daily and with exercise.  Palpitations appear worse with exercise.  Patient has been treated with beta-blockers: and calcium channel blockers:.  Previous evaluation to date includes: stress test.    Preventive Screening-Counseling &  Management  Alcohol-Tobacco     Smoking Cessation Counseling: yes  Current Medications (verified): 1)  Tiazac 180 Mg Xr24h-Cap (Diltiazem Hcl Er Beads) .... Take 1 Tablet By Mouth Once A Day 2)  Atenolol 50 Mg Tabs (Atenolol) .... Take 1 Tablet By Mouth Two Times A Day 3)  Synthroid 50 Mcg Tabs (Levothyroxine Sodium) .... Take 1 Tablet By Mouth Once A Day 4)  Fish Oil 5)  Vitamin D .... 2 Qd 6)  Asa 81mg  .... Take 1 Tablet By Mouth Once A Day  Allergies (verified): 1)  ! Penicillin  Past History:  Past Medical History: Irregular heart rate with a normal ETT one year ago in [place] Anxiety Hypothyroidism Benign prostatic hypertrophy Hypertension Colonic polyps, hx of  Past Surgical History: Reviewed history from 05/17/2009 and no changes required. Inguinal herniorrhaphy Tonsillectomy  Family History: Reviewed history from 05/17/2009 and no changes required. Family History of Alcoholism/Addiction Family History Breast cancer 1st degree relative <50  Social History: Reviewed history from 05/17/2009 and no changes required. Occupation: truck Hospital doctor Married Current Smoker Alcohol use-yes Drug use-no Regular exercise-yes  Review of Systems  The patient denies hemoptysis, abdominal pain, hematuria, suspicious skin lesions, and difficulty walking.   CV:  Complains of palpitations; denies chest pain or discomfort, fainting, fatigue, leg cramps with exertion, lightheadness, near fainting, shortness of breath with exertion, swelling of feet,  and weight gain.  Physical Exam  General:  alert, well-developed, well-nourished, well-hydrated, appropriate dress, normal appearance, healthy-appearing, and cooperative to examination.   Mouth:  Oral mucosa and oropharynx without lesions or exudates.  Teeth in good repair. Neck:  supple, full ROM, no masses, no thyromegaly, normal carotid upstroke, and no carotid bruits.   Lungs:  Normal respiratory effort, chest expands  symmetrically. Lungs are clear to auscultation, no crackles or wheezes. Heart:  Normal rate and regular rhythm. S1 and S2 normal without gallop, murmur, click, rub or other extra sounds. Abdomen:  Bowel sounds positive,abdomen soft and non-tender without masses, organomegaly or hernias noted. Msk:  normal ROM, no joint tenderness, no joint swelling, no joint warmth, no redness over joints, and no joint deformities.   Pulses:  R and L carotid,radial,femoral,dorsalis pedis and posterior tibial pulses are full and equal bilaterally Extremities:  No clubbing, cyanosis, edema, or deformity noted with normal full range of motion of all joints.   Neurologic:  alert & oriented X3, cranial nerves II-XII intact, strength normal in all extremities, sensation intact to light touch, sensation intact to pinprick, gait normal, DTRs symmetrical and normal, and finger-to-nose normal.   Skin:  Intact without suspicious lesions or rashes Psych:  Cognition and judgment appear intact. Alert and cooperative with normal attention span and concentration. No apparent delusions, illusions, hallucinations Additional Exam:  EKG shows sinus brady with 1st degree av block but normal st/t segments   Impression & Recommendations:  Problem # 1:  PALPITATIONS (ICD-785.1) Assessment New will lower the dose of atenolol due to low hr and 1o av block, also will order an event monitor His updated medication list for this problem includes:    Atenolol 50 Mg Tabs (Atenolol) .Marland Kitchen... Take 1/2  tablet by mouth two times a day  Orders: Cardiology Referral (Cardiology) EKG w/ Interpretation (93000)  Problem # 2:  BRONCHITIS-ACUTE (ICD-466.0) Assessment: New  His updated medication list for this problem includes:    Zithromax Tri-pak 500 Mg Tab (Azithromycin) .Marland Kitchen... Take one by mouth once daily for 3 days    Tussionex Pennkinetic Er 8-10 Mg/51ml Lqcr (Chlorpheniramine-hydrocodone) .Marland KitchenMarland KitchenMarland KitchenMarland Kitchen 5 ml by mouth two times a day as needed for  cough  Take antibiotics and other medications as directed. Encouraged to push clear liquids, get enough rest, and take acetaminophen as needed. To be seen in 5-7 days if no improvement, sooner if worse.  Orders: EKG w/ Interpretation (93000)  Problem # 3:  COUGH (ICD-786.2) Assessment: New  Orders: T-2 View CXR (71020TC) EKG w/ Interpretation (93000)  Complete Medication List: 1)  Tiazac 180 Mg Xr24h-cap (Diltiazem hcl er beads) .... Take 1 tablet by mouth once a day 2)  Atenolol 50 Mg Tabs (Atenolol) .... Take 1/2  tablet by mouth two times a day 3)  Synthroid 50 Mcg Tabs (Levothyroxine sodium) .... Take 1 tablet by mouth once a day 4)  Fish Oil  5)  Vitamin D  .... 2 qd 6)  Asa 81mg   .... Take 1 tablet by mouth once a day 7)  Zithromax Tri-pak 500 Mg Tab (Azithromycin) .... Take one by mouth once daily for 3 days 8)  Tussionex Pennkinetic Er 8-10 Mg/52ml Lqcr (Chlorpheniramine-hydrocodone) .... 5 ml by mouth two times a day as needed for cough  Patient Instructions: 1)  Please schedule a follow-up appointment in 1 month. 2)  Tobacco is very bad for your health and your loved ones! You Should stop smoking!. 3)  Stop Smoking Tips: Choose a Quit  date. Cut down before the Quit date. decide what you will do as a substitute when you feel the urge to smoke(gum,toothpick,exercise). 4)  Take your antibiotic as prescribed until ALL of it is gone, but stop if you develop a rash or swelling and contact our office as soon as possible. 5)  Acute bronchitis symptoms for less than 10 days are not helped by antibiotics. take over the counter cough medications. call if no improvment in  5-7 days, sooner if increasing cough, fever, or new symptoms( shortness of breath, chest pain). Prescriptions: TUSSIONEX PENNKINETIC ER 8-10 MG/5ML LQCR (CHLORPHENIRAMINE-HYDROCODONE) 5 ml by mouth two times a day as needed for cough  #4 ounces x 0   Entered and Authorized by:   Etta Grandchild MD   Signed by:    Etta Grandchild MD on 10/22/2009   Method used:   Print then Give to Patient   RxID:   [id number] ZITHROMAX TRI-PAK 500 MG TAB (AZITHROMYCIN) Take one by mouth once daily for 3 days  #3 x 0   Entered and Authorized by:   Etta Grandchild MD   Signed by:   Etta Grandchild MD on 10/22/2009   Method used:   Print then Give to Patient   RxID:   [id number]

## 2010-10-07 NOTE — Assessment & Plan Note (Signed)
Summary: NP6/A-FIB   Primary Provider:  Etta Grandchild MD  CC:  no complaints.  History of Present Illness: 65 year old male for evaluation of atrial fibrillation. Note TSH in March of 2011 was normal at 1.56. A CardioNet monitor in February revealed sinus rhythm with occasional bursts of atrial fibrillation. Because of the above we were asked to further evaluate. The patient states he has had paroxysmal atrial fibrillation for 40 years. He had an episode of chest pain approximately 9 years ago and had a cardiac catheterization that was normal by his report. He previously lived in [place]. He had an exercise stress test approximately one year ago that was normal. He typically does not have dyspnea on exertion, orthopnea, PND, pedal edema, chest pain or syncope. He has had intermittent palpitations for 4 years. Some of these are a "skip". Others last for approximately 2 hours and are described as his heart racing. There is associated weakness but there is no chest pain, shortness of breath or syncope. They resolve spontaneously. He had a monitor placed recently which showed atrial fibrillation associated with these symptoms. Because of the above we were asked to further evaluate.  Current Medications (verified): 1)  Tiazac 180 Mg Xr24h-Cap (Diltiazem Hcl Er Beads) .... Take 1 Tablet By Mouth Once A Day 2)  Atenolol 50 Mg Tabs (Atenolol) .... Take 1/2  Tablet By Mouth Two Times A Day 3)  Synthroid 50 Mcg Tabs (Levothyroxine Sodium) .... Take 1 Tablet By Mouth Once A Day 4)  Fish Oil   Oil (Fish Oil) .Marland Kitchen.. 1  Tab By Mouth Once Daily 5)  Vitamin D .... 2 Qd 6)  Aspirin 81 Mg  Tabs (Aspirin) .Marland Kitchen.. 1 Tab By Mouth Once Daily 7)  Ciprofloxacin Hcl 500 Mg Tabs (Ciprofloxacin Hcl) .... One By Mouth Two Times A Day For 30 Days  Allergies: 1)  ! Penicillin  Past History:  Past Medical History: ATRIAL FIBRILLATION (ICD-427.31) HYPERTENSION (ICD-401.9) EXTERNAL THROMBOSED HEMORRHOIDS (ICD-455.4) COLONIC  POLYPS, HX OF (ICD-V12.72) BENIGN PROSTATIC HYPERTROPHY (ICD-600.00) HYPOTHYROIDISM (ICD-244.9) ANXIETY (ICD-300.00) Low HDL Nephrolithiasis  Past Surgical History: Inguinal herniorrhaphy Tonsillectomy Partial thyroidectomy Appendectomy  Family History: Reviewed history from 05/17/2009 and no changes required. Family History of Alcoholism/Addiction Family History Breast cancer 1st degree relative <50 Father died of AAA; MI at age 29  Social History: Reviewed history from 05/17/2009 and no changes required. Occupation: truck Hospital doctor Married Current Smoker - 2 cigars per day Alcohol use-none Drug use-no Regular exercise-yes  Review of Systems       Urinary frequency but no fevers or chills, productive cough, hemoptysis, dysphasia, odynophagia, melena, hematochezia, dysuria, hematuria, rash, seizure activity, orthopnea, PND, pedal edema, claudication. Remaining systems are negative.   Vital Signs:  Patient profile:   65 year old male Height:      72 inches Weight:      199 pounds BMI:     27.09 Pulse rate:   57 / minute Resp:     14 per minute BP sitting:   140 / 72  (left arm)  Vitals Entered By: Kem Parkinson (December 31, 2009 2:56 PM)  Physical Exam  General:  Well developed/well nourished in NAD Skin warm/dry Patient not depressed No peripheral clubbing Back-normal HEENT-normal/normal eyelids Neck supple/normal carotid upstroke bilaterally; no bruits; no JVD; no thyromegaly chest - CTA/ normal expansion CV - RRR/normal S1 and S2; no  rubs or gallops;  PMI nondisplaced, 1/6 systolic murmur at the apex. Abdomen -NT/ND, no HSM, no mass, + bowel sounds,  no bruit 2+ femoral pulses, no bruits Ext-no edema, chords, 2+ DP Neuro-grossly nonfocal     EKG  Procedure date:  12/31/2009  Findings:      Sinus bradycardia at a rate of 58. First degree AV block. No significant ST changes.  Impression & Recommendations:  Problem # 1:  ATRIAL FIBRILLATION  (ICD-427.31) The patient has documented paroxysmal atrial fibrillation. His symptoms are becoming more frequent. A recent TSH was normal. Schedule echocardiogram to quantify LV function. Note he had previous catheterization approximately 8-9 years ago that was normal by his report and a normal stress test approximately one year ago. Those records are not available. However there is no indication of coronary disease. I will continue with his beta blocker and calcium blocker. I will add flecainide 50 mg p.o. b.i.d. 5 days later we will schedule a stress echocardiogram both to exclude exercise-induced ventricular tachycardia and to screen for coronary disease. If there was indication of either then the flecainide would need to be discontinued. If he has continuing symptoms despite flecainide I will refer him for consideration of atrial fibrillation ablation. His only embolic risk factor is hypertension. I've asked him to increase his enteric-coated aspirin 325 mg p.o. daily. I do not think he requires Coumadin at this point. His updated medication list for this problem includes:    Atenolol 50 Mg Tabs (Atenolol) .Marland Kitchen... Take 1/2  tablet by mouth two times a day    Aspirin 81 Mg Tabs (Aspirin) .Marland Kitchen... 1 tab by mouth once daily  Orders: EKG w/ Interpretation (93000) Echocardiogram (Echo)  Problem # 2:  HYPERTENSION (ICD-401.9) Blood pressure reasonably controlled on present medications. Will continue. His updated medication list for this problem includes:    Tiazac 180 Mg Xr24h-cap (Diltiazem hcl er beads) .Marland Kitchen... Take 1 tablet by mouth once a day    Atenolol 50 Mg Tabs (Atenolol) .Marland Kitchen... Take 1/2  tablet by mouth two times a day    Aspirin Ec 325 Mg Tbec (Aspirin) .Marland Kitchen... Take one tablet by mouth daily  His updated medication list for this problem includes:    Tiazac 180 Mg Xr24h-cap (Diltiazem hcl er beads) .Marland Kitchen... Take 1 tablet by mouth once a day    Atenolol 50 Mg Tabs (Atenolol) .Marland Kitchen... Take 1/2  tablet by  mouth two times a day    Aspirin 81 Mg Tabs (Aspirin) .Marland Kitchen... 1 tab by mouth once daily  Problem # 3:  PROSTATITIS, ACUTE (ICD-601.0) Management per primary care.  Problem # 4:  HYPOTHYROIDISM (ICD-244.9)  His updated medication list for this problem includes:    Synthroid 50 Mcg Tabs (Levothyroxine sodium) .Marland Kitchen... Take 1 tablet by mouth once a day  His updated medication list for this problem includes:    Synthroid 50 Mcg Tabs (Levothyroxine sodium) .Marland Kitchen... Take 1 tablet by mouth once a day  Problem # 5:  TOBACCO ABUSE (ICD-305.1) Patient counseled on discontinuing.  Other Orders: Stress Echo (Stress Echo)  Patient Instructions: 1)  Your physician recommends that you schedule a follow-up appointment in: 8 weeks 2)  Your physician has requested that you have an echocardiogram.  Echocardiography is a painless test that uses sound waves to create images of your heart. It provides your doctor with information about the size and shape of your heart and how well your heart's chambers and valves are working.  This procedure takes approximately one hour. There are no restrictions for this procedure. 3)  Your physician has requested that you have a stress echocardiogram. For further  information please visit [email or web address]  Please follow instruction sheet as given. 4)  Start Flecainide 50 mg twice a day 5 days prior Stress echo. 5)  Your physician has recommended you make the following change in your medication: Asprin 325 mg take one tablet by mouth daily. Prescriptions: TAMBOCOR 50 MG TABS (FLECAINIDE ACETATE) take on tablet by mouth twice a day start 5 days prior Stress echo  #60 x 6   Entered by:   Ollen Gross, RN, BSN   Authorized by:   Ferman Hamming, MD, [facility]   Signed by:   Ollen Gross, RN, BSN on 12/31/2009   Method used:   Electronically to        [facility] [address] [phone]* (retail)       [address]       [place], [place]  [address]        Ph: [phone] or [phone]       Fax: [phone]   RxID:   [id number]

## 2010-10-07 NOTE — Miscellaneous (Signed)
Summary: [facility] Previsit/prep  Clinical Lists Changes  Medications: Added new medication of MOVIPREP 100 GM  SOLR (PEG-KCL-NACL-NASULF-NA ASC-C) As per prep instructions. - Signed Rx of MOVIPREP 100 GM  SOLR (PEG-KCL-NACL-NASULF-NA ASC-C) As per prep instructions.;  #1 x 0;  Signed;  Entered by: Wyona Almas RN;  Authorized by: Louis Meckel MD;  Method used: Electronically to [facility] [address] [phone]*, [address] [place], [place], [place]  [address], Ph: [phone] or [phone], Fax: [phone] Observations: Added new observation of ALLERGY REV: Done (09/13/2009 7:45)    Prescriptions: MOVIPREP 100 GM  SOLR (PEG-KCL-NACL-NASULF-NA ASC-C) As per prep instructions.  #1 x 0   Entered by:   Wyona Almas RN   Authorized by:   Louis Meckel MD   Signed by:   Wyona Almas RN on 09/13/2009   Method used:   Electronically to        [facility]. [address] [phone]* (retail)       [address]       [place], [place]  [address]       Ph: [phone] or [phone]       Fax: [phone]   RxID:   [id number]

## 2010-10-07 NOTE — Assessment & Plan Note (Signed)
Summary: 3 month rov   Primary Provider:  Etta Grandchild MD  CC:  follow up echo.  History of Present Illness: Pleasant gentleman with PMH atrial fibrillation for f/iu. Note TSH in March of 2011 was normal at 1.56. A CardioNet monitor in February revealed sinus rhythm with occasional bursts of atrial fibrillation.  The patient states he has had paroxysmal atrial fibrillation for 40 years. He had an episode of chest pain approximately 9 years ago and had a cardiac catheterization that was normal by his report. He previously lived in [place]. He had an exercise stress test approximately one year ago that was normal. An echocardiogram in May of 2011 revealed normal LV function, trivial aortic insufficiency and mild left atrial enlargement. I last saw him in May of 2011. We initiated flecainide. A followup stress echocardiogram showed no ischemia and normal LV function. There was a question of septal hypokinesis both at rest and with stress. There was no exercise induced arrhythmias. Since then the patient denies any dyspnea on exertion, orthopnea, PND, pedal edema,  syncope or chest pain. He has had no bouts of atrial fibrillation although he occasionally feels a "skip". His palpitations are much better.   Current Medications (verified): 1)  Tiazac 180 Mg Xr24h-Cap (Diltiazem Hcl Er Beads) .... Take 1 Tablet By Mouth Once A Day 2)  Atenolol 50 Mg Tabs (Atenolol) .... Take 1/2  Tablet By Mouth Two Times A Day 3)  Synthroid 50 Mcg Tabs (Levothyroxine Sodium) .... Take 1 Tablet By Mouth Once A Day 4)  Fish Oil   Oil (Fish Oil) .Marland Kitchen.. 1  Tab By Mouth Once Daily 5)  Vitamin D .... 2 Qd 6)  Aspirin Ec 325 Mg Tbec (Aspirin) .... Take One Tablet By Mouth Daily 7)  Tambocor 50 Mg Tabs (Flecainide Acetate) .... 2 Tab By Mouth Once Daily  Allergies: 1)  ! Penicillin  Past History:  Past Medical History: Reviewed history from 12/31/2009 and no changes required. ATRIAL FIBRILLATION  (ICD-427.31) HYPERTENSION (ICD-401.9) EXTERNAL THROMBOSED HEMORRHOIDS (ICD-455.4) COLONIC POLYPS, HX OF (ICD-V12.72) BENIGN PROSTATIC HYPERTROPHY (ICD-600.00) HYPOTHYROIDISM (ICD-244.9) ANXIETY (ICD-300.00) Low HDL Nephrolithiasis  Past Surgical History: Reviewed history from 12/31/2009 and no changes required. Inguinal herniorrhaphy Tonsillectomy Partial thyroidectomy Appendectomy  Social History: Reviewed history from 12/31/2009 and no changes required. Occupation: truck Hospital doctor Married Current Smoker - 2 cigars per day Alcohol use-none Drug use-no Regular exercise-yes  Review of Systems       no fevers or chills, productive cough, hemoptysis, dysphasia, odynophagia, melena, hematochezia, dysuria, hematuria, rash, seizure activity, orthopnea, PND, pedal edema, claudication. Remaining systems are negative.   Vital Signs:  Patient profile:   65 year old male Height:      72 inches Weight:      206 pounds BMI:     28.04 Pulse rate:   48 / minute Resp:     14 per minute BP sitting:   151 / 83  (left arm)  Vitals Entered By: Kem Parkinson (April 14, 2010 8:04 AM)  Physical Exam  General:  Well-developed well-nourished in no acute distress.  Skin is warm and dry.  HEENT is normal.  Neck is supple. No thyromegaly.  Chest is clear to auscultation with normal expansion.  Cardiovascular exam is bradycardic Abdominal exam nontender or distended. No masses palpated. Extremities show no edema. neuro grossly intact    EKG  Procedure date:  04/14/2010  Findings:      Marked sinus bradycardia at a rate of 48. First  degree AV block. No ST changes.  Impression & Recommendations:  Problem # 1:  ATRIAL FIBRILLATION (ICD-427.31) Pt remains in sinus rhythm. Continue flecainide, beta blocker and Cardizem. Continue aspirin. Hypertension is his only embolic risk factor. His updated medication list for this problem includes:    Atenolol 50 Mg Tabs (Atenolol) .Marland Kitchen... Take  1 tablet by mouth once daily    Aspirin Ec 325 Mg Tbec (Aspirin) .Marland Kitchen... Take one tablet by mouth daily    Tambocor 50 Mg Tabs (Flecainide acetate) .Marland Kitchen... 2 tab by mouth once daily  Problem # 2:  HYPERTENSION (ICD-401.9) Blood pressure is elevated. I will decrease his atenolol to 25 mg p.o. daily given his bradycardia. He is describing mild fatigue and hopefully this will help his symptoms. I will add lisinopril 10 mg p.o. daily. Check potassium and renal function in one week. His updated medication list for this problem includes:    Tiazac 180 Mg Xr24h-cap (Diltiazem hcl er beads) .Marland Kitchen... Take 1 tablet by mouth once a day    Atenolol 50 Mg Tabs (Atenolol) .Marland Kitchen... Take 1 tablet by mouth once daily    Aspirin Ec 325 Mg Tbec (Aspirin) .Marland Kitchen... Take one tablet by mouth daily    Lisinopril 10 Mg Tabs (Lisinopril) .Marland Kitchen... Take one tablet by mouth daily  Problem # 3:  HYPOTHYROIDISM (ICD-244.9)  His updated medication list for this problem includes:    Synthroid 50 Mcg Tabs (Levothyroxine sodium) .Marland Kitchen... Take 1 tablet by mouth once a day  Problem # 4:  BRADYCARDIA (ICD-427.89) Decrease atenolol to 25 mg p.o. daily. His updated medication list for this problem includes:    Tiazac 180 Mg Xr24h-cap (Diltiazem hcl er beads) .Marland Kitchen... Take 1 tablet by mouth once a day    Atenolol 50 Mg Tabs (Atenolol) .Marland Kitchen... Take 1 tablet by mouth once daily    Aspirin Ec 325 Mg Tbec (Aspirin) .Marland Kitchen... Take one tablet by mouth daily    Tambocor 50 Mg Tabs (Flecainide acetate) .Marland Kitchen... 2 tab by mouth once daily    Lisinopril 10 Mg Tabs (Lisinopril) .Marland Kitchen... Take one tablet by mouth daily  Patient Instructions: 1)  Your physician recommends that you schedule a follow-up appointment in: 6 MONTHS 2)  Your physician recommends that you return for lab work in:ONE WEEK 3)  Your physician has recommended you make the following change in your medication: DECREASE ATENOLOL TO 25MG  ONCE DAILY 4)  START LISINOPRIL 10MG  ONCE  DAILY Prescriptions: LISINOPRIL 10 MG TABS (LISINOPRIL) Take one tablet by mouth daily  #30 x 12   Entered by:   [name], RN   Authorized by:   Ferman Hamming, MD, [facility]   Signed by:   [name], RN on 04/14/2010   Method used:   Electronically to        [facility] [address] [phone]* (retail)       [address]       [place], [place]  [address]       Ph: [phone] or [phone]       Fax: [phone]   RxID:   [id number]

## 2010-10-07 NOTE — Letter (Signed)
Summary: [facility] Instructions  [facility]  [address] [place], [place] [address]   Phone: [phone]  Fax: [phone]       Kevin Bradley    1946/05/28    MRN: [id number]        Procedure Day Dorna Bloom:  Farrell Ours  10/11/09     Arrival Time:  10:00AM      Procedure Time:  11:00AM     Location of Procedure:                    [facility] Pares _  [facility] (4th Floor)                        PREPARATION FOR COLONOSCOPY WITH MOVIPREP   Starting 5 days prior to your procedure 10/06/09 do not eat nuts, seeds, popcorn, corn, beans, peas,  salads, or any raw vegetables.  Do not take any fiber supplements (e.g. Metamucil, Citrucel, and Benefiber).  THE DAY BEFORE YOUR PROCEDURE         DATE:10/10/09  DAY: THURSDAY  1.  Drink clear liquids the entire day-NO SOLID FOOD  2.  Do not drink anything colored red or purple.  Avoid juices with pulp.  No orange juice.  3.  Drink at least 64 oz. (8 glasses) of fluid/clear liquids during the day to prevent dehydration and help the prep work efficiently.  CLEAR LIQUIDS INCLUDE: Water Jello Ice Popsicles Tea (sugar ok, no milk/cream) Powdered fruit flavored drinks Coffee (sugar ok, no milk/cream) Gatorade Juice: apple, white grape, white cranberry  Lemonade Clear bullion, consomm, broth Carbonated beverages (any kind) Strained chicken noodle soup Hard Candy                             4.  In the morning, mix first dose of MoviPrep solution:    Empty 1 Pouch A and 1 Pouch B into the disposable container    Add lukewarm drinking water to the top line of the container. Mix to dissolve    Refrigerate (mixed solution should be used within 24 hrs)  5.  Begin drinking the prep at 5:00 p.m. The MoviPrep container is divided by 4 marks.   Every 15 minutes drink the solution down to the next mark (approximately 8 oz) until the full liter is complete.   6.  Follow completed prep with 16 oz of clear liquid of your choice  (Nothing red or purple).  Continue to drink clear liquids until bedtime.  7.  Before going to bed, mix second dose of MoviPrep solution:    Empty 1 Pouch A and 1 Pouch B into the disposable container    Add lukewarm drinking water to the top line of the container. Mix to dissolve    Refrigerate  THE DAY OF YOUR PROCEDURE      DATE: 10/11/09 DAY: FRIDAY  Beginning at 6:00AM (5 hours before procedure):         1. Every 15 minutes, drink the solution down to the next mark (approx 8 oz) until the full liter is complete.  2. Follow completed prep with 16 oz. of clear liquid of your choice.    3. You may drink clear liquids until 9:00AM (2 HOURS BEFORE PROCEDURE).   MEDICATION INSTRUCTIONS  Unless otherwise instructed, you should take regular prescription medications with a small sip of water   as early as possible the morning of  your procedure.         OTHER INSTRUCTIONS  You will need a responsible adult at least 66 years of age to accompany you and drive you home.   This person must remain in the waiting room during your procedure.  Wear loose fitting clothing that is easily removed.  Leave jewelry and other valuables at home.  However, you may wish to bring a book to read or  an iPod/MP3 player to listen to music as you wait for your procedure to start.  Remove all body piercing jewelry and leave at home.  Total time from sign-in until discharge is approximately 2-3 hours.  You should go home directly after your procedure and rest.  You can resume normal activities the  day after your procedure.  The day of your procedure you should not:   Drive   Make legal decisions   Operate machinery   Drink alcohol   Return to work  You will receive specific instructions about eating, activities and medications before you leave.    The above instructions have been reviewed and explained to me by   Wyona Almas RN  [birth year] 8:29 AM     I fully understand  and can verbalize these instructions _____________________________ Date _________

## 2010-10-07 NOTE — Assessment & Plan Note (Signed)
Summary: PNEUMONIA AND FLU SHOT/PN  Nurse Visit   Vital Signs:  Patient profile:   65 year old male BP sitting:   140 / 86  (left arm)  Allergies: 1)  ! Penicillin  Immunizations Administered:  Pneumonia Vaccine:    Vaccine Type: Pneumovax    Site: right deltoid    Mfr: Merck    Dose: 0.5 ml    Route: IM    Given by: Ami Bullins CMA    Exp. Date: 01/02/2012    Lot #: [id number]    VIS given: 08/12/09 version given July 14, 2010.  Orders Added: 1)  Pneumococcal Vaccine [90732] 2)  Admin 1st Vaccine [90471] 3)  Admin 1st Vaccine [90471] 4)  Flu Vaccine 21yrs + [04540]      Flu Vaccine Consent Questions     Do you have a history of severe allergic reactions to this vaccine? no    Any prior history of allergic reactions to egg and/or gelatin? no    Do you have a sensitivity to the preservative Thimersol? no    Do you have a past history of Guillan-Barre Syndrome? no    Do you currently have an acute febrile illness? no    Have you ever had a severe reaction to latex? no    Vaccine information given and explained to patient? yes    Are you currently pregnant? no    Lot Number:[id number]   Exp Date:03/07/2011   Site Given  Left Deltoid IMu1

## 2010-10-07 NOTE — Assessment & Plan Note (Signed)
Summary: bump on the base of his neck-lb   Vital Signs:  Patient profile:   65 year old male Height:      72 inches Weight:      200 pounds BMI:     27.22 O2 Sat:      98 % on Room air Temp:     98.2 degrees F oral Pulse rate:   44 / minute Pulse rhythm:   regular Resp:     16 per minute BP sitting:   138 / 76  (left arm)  Vitals Entered By: Rock Nephew CMA (February 06, 2010 8:12 AM)  O2 Flow:  Room air CC: knot on R side of neck/ no complaint of pain, Hypertension Management Is Patient Diabetic? No Pain Assessment Patient in pain? no        Primary Care Provider:  Etta Grandchild MD  CC:  knot on R side of neck/ no complaint of pain and Hypertension Management.  History of Present Illness: He returns c/o a painful lump on his right lower neck for about 5 days. Otherwise he has felt well.  Hypertension History:      He denies headache, chest pain, palpitations, dyspnea with exertion, orthopnea, PND, peripheral edema, visual symptoms, neurologic problems, syncope, and side effects from treatment.  He notes no problems with any antihypertensive medication side effects.        Positive major cardiovascular risk factors include male age 79 years old or older, hyperlipidemia, hypertension, and current tobacco user.  Negative major cardiovascular risk factors include no history of diabetes and negative family history for ischemic heart disease.        Further assessment for target organ damage reveals no history of ASHD, cardiac end-organ damage (CHF/LVH), stroke/TIA, peripheral vascular disease, renal insufficiency, or hypertensive retinopathy.     Preventive Screening-Counseling & Management  Alcohol-Tobacco     Alcohol drinks/day: 0     Smoking Status: current     Smoking Cessation Counseling: yes     Smoke Cessation Stage: precontemplative     Packs/Day: 0.75     Cigars/week: 14     Tobacco Counseling: to quit use of tobacco products  Hep-HIV-STD-Contraception  Hepatitis Risk: no risk noted     HIV Risk: no risk noted     STD Risk: no risk noted      Sexual History:  currently monogamous.        Drug Use:  never.        Blood Transfusions:  no.    Clinical Review Panels:  Lipid Management   Cholesterol:  155 (05/17/2009)   LDL (bad choesterol):  98 (05/17/2009)   HDL (good cholesterol):  29.60 (05/17/2009)  Diabetes Management   Creatinine:  1.0 (05/17/2009)   Last Flu Vaccine:  Historical (06/07/2009)  CBC   WBC:  6.6 (05/17/2009)   RBC:  5.46 (05/17/2009)   Hgb:  16.9 (05/17/2009)   Hct:  50.5 (05/17/2009)   Platelets:  208.0 (05/17/2009)   MCV  92.4 (05/17/2009)   MCHC  33.5 (05/17/2009)   RDW  11.8 (05/17/2009)   PMN:  61.5 (05/17/2009)   Lymphs:  24.2 (05/17/2009)   Monos:  10.1 (05/17/2009)   Eosinophils:  3.7 (05/17/2009)   Basophil:  0.5 (05/17/2009)  Complete Metabolic Panel   Glucose:  101 (05/17/2009)   Sodium:  142 (05/17/2009)   Potassium:  4.3 (05/17/2009)   Chloride:  106 (05/17/2009)   CO2:  32 (05/17/2009)   BUN:  17 (05/17/2009)  Creatinine:  1.0 (05/17/2009)   Albumin:  3.9 (05/17/2009)   Total Protein:  7.1 (05/17/2009)   Calcium:  9.5 (05/17/2009)   Total Bili:  1.2 (05/17/2009)   Alk Phos:  62 (05/17/2009)   SGPT (ALT):  18 (05/17/2009)   SGOT (AST):  17 (05/17/2009)   Medications Prior to Update: 1)  Tiazac 180 Mg Xr24h-Cap (Diltiazem Hcl Er Beads) .... Take 1 Tablet By Mouth Once A Day 2)  Atenolol 50 Mg Tabs (Atenolol) .... Take 1/2  Tablet By Mouth Two Times A Day 3)  Synthroid 50 Mcg Tabs (Levothyroxine Sodium) .... Take 1 Tablet By Mouth Once A Day 4)  Fish Oil   Oil (Fish Oil) .Marland Kitchen.. 1  Tab By Mouth Once Daily 5)  Vitamin D .... 2 Qd 6)  Aspirin Ec 325 Mg Tbec (Aspirin) .... Take One Tablet By Mouth Daily 7)  Tambocor 50 Mg Tabs (Flecainide Acetate) .... Hold Has Not Taken Yet Take On Tablet By Mouth Twice A Day Start 5 Days Prior Stress Echo  Current Medications (verified): 1)   Tiazac 180 Mg Xr24h-Cap (Diltiazem Hcl Er Beads) .... Take 1 Tablet By Mouth Once A Day 2)  Atenolol 50 Mg Tabs (Atenolol) .... Take 1/2  Tablet By Mouth Two Times A Day 3)  Synthroid 50 Mcg Tabs (Levothyroxine Sodium) .... Take 1 Tablet By Mouth Once A Day 4)  Fish Oil   Oil (Fish Oil) .Marland Kitchen.. 1  Tab By Mouth Once Daily 5)  Vitamin D .... 2 Qd 6)  Aspirin Ec 325 Mg Tbec (Aspirin) .... Take One Tablet By Mouth Daily 7)  Tambocor 50 Mg Tabs (Flecainide Acetate) .... Hold Has Not Taken Yet Take On Tablet By Mouth Twice A Day Start 5 Days Prior Stress Echo  Allergies (verified): 1)  ! Penicillin  Past History:  Past Medical History: Last updated: 12/31/2009 ATRIAL FIBRILLATION (ICD-427.31) HYPERTENSION (ICD-401.9) EXTERNAL THROMBOSED HEMORRHOIDS (ICD-455.4) COLONIC POLYPS, HX OF (ICD-V12.72) BENIGN PROSTATIC HYPERTROPHY (ICD-600.00) HYPOTHYROIDISM (ICD-244.9) ANXIETY (ICD-300.00) Low HDL Nephrolithiasis  Past Surgical History: Last updated: 12/31/2009 Inguinal herniorrhaphy Tonsillectomy Partial thyroidectomy Appendectomy  Family History: Last updated: 12/31/2009 Family History of Alcoholism/Addiction Family History Breast cancer 1st degree relative <50 Father died of AAA; MI at age 63  Social History: Last updated: 12/31/2009 Occupation: truck driver Married Current Smoker - 2 cigars per day Alcohol use-none Drug use-no Regular exercise-yes  Risk Factors: Alcohol Use: 0 (02/06/2010) Exercise: yes (05/17/2009)  Risk Factors: Smoking Status: current (02/06/2010) Packs/Day: 0.75 (02/06/2010) Cigars/wk: 14 (02/06/2010)  Family History: Reviewed history from 12/31/2009 and no changes required. Family History of Alcoholism/Addiction Family History Breast cancer 1st degree relative <50 Father died of AAA; MI at age 38  Social History: Reviewed history from 12/31/2009 and no changes required. Occupation: truck Hospital doctor Married Current Smoker - 2 cigars per  day Alcohol use-none Drug use-no Regular exercise-yes Packs/Day:  0.75  Review of Systems       The patient complains of enlarged lymph nodes.  The patient denies anorexia, fever, weight loss, weight gain, chest pain, dyspnea on exertion, prolonged cough, headaches, hemoptysis, abdominal pain, hematuria, suspicious skin lesions, and angioedema.   General:  Denies chills, fatigue, fever, loss of appetite, malaise, sleep disorder, sweats, weakness, and weight loss. Endo:  Denies cold intolerance, excessive hunger, excessive thirst, excessive urination, heat intolerance, polyuria, and weight change. Heme:  Complains of enlarge lymph nodes; denies abnormal bruising, bleeding, fevers, pallor, and skin discoloration.  Physical Exam  General:  alert, well-developed, well-nourished, well-hydrated,  appropriate dress, normal appearance, healthy-appearing, cooperative to examination, and good hygiene.   Head:  normocephalic, atraumatic, no abnormalities observed, and no abnormalities palpated.   Ears:  R ear normal and L ear normal.   Nose:  External nasal examination shows no deformity or inflammation. Nasal mucosa are pink and moist without lesions or exudates. Mouth:  Oral mucosa and oropharynx without lesions or exudates.  Teeth in good repair. Neck:  Over his right lateral, lower neck there is a discreet sub-q mass that measures about 3 cm that feels like a lymph node. It feels fixed but is non-tender, non-fluctuant, and non-indurated. the overlying skin is normal. Chest Wall:  no deformities, no tenderness, no masses, and no gynecomastia.   Lungs:  normal respiratory effort, no intercostal retractions, no accessory muscle use, normal breath sounds, no dullness, no fremitus, no crackles, and no wheezes.   Heart:  normal rate, regular rhythm, no murmur, no gallop, no rub, and no JVD.   Abdomen:  soft, non-tender, normal bowel sounds, no distention, no masses, no guarding, no rigidity, no rebound  tenderness, no abdominal hernia, no inguinal hernia, no hepatomegaly, and no splenomegaly.   Skin:  turgor normal, no rashes, no suspicious lesions, no ecchymoses, no petechiae, no purpura, no ulcerations, no edema, excessive tan, solar damage, and seborrheic keratosis.   Cervical Nodes:  no anterior cervical adenopathy and no posterior cervical adenopathy.   Axillary Nodes:  no R axillary adenopathy and no L axillary adenopathy.   Inguinal Nodes:  no R inguinal adenopathy and no L inguinal adenopathy.   Psych:  Oriented X3, memory intact for recent and remote, normally interactive, good eye contact, not depressed appearing, not agitated, and slightly anxious.     Impression & Recommendations:  Problem # 1:  NECK MASS (ICD-784.2) Assessment New I am concerned this may be a malignant lesion with his hx. of tobacco abuse so I will set him up for a CT scan Orders: Venipuncture (16109) TLB-BMP (Basic Metabolic Panel-BMET) (80048-METABOL) TLB-TSH (Thyroid Stimulating Hormone) (84443-TSH) Tobacco use cessation intermediate 3-10 minutes (60454) Radiology Referral (Radiology) TLB-CBC Platelet - w/Differential (85025-CBCD)  Problem # 2:  TOBACCO ABUSE (ICD-305.1) Assessment: Unchanged  Orders: Tobacco use cessation intermediate 3-10 minutes (09811) Radiology Referral (Radiology)  Encouraged smoking cessation and discussed different methods for smoking cessation.   Problem # 3:  ATRIAL FIBRILLATION (ICD-427.31) Assessment: Improved  His updated medication list for this problem includes:    Tiazac 180 Mg Xr24h-cap (Diltiazem hcl er beads) .Marland Kitchen... Take 1 tablet by mouth once a day    Atenolol 50 Mg Tabs (Atenolol) .Marland Kitchen... Take 1/2  tablet by mouth two times a day    Aspirin Ec 325 Mg Tbec (Aspirin) .Marland Kitchen... Take one tablet by mouth daily    Tambocor 50 Mg Tabs (Flecainide acetate) ..... Hold has not taken yet take on tablet by mouth twice a day start 5 days prior stress echo  Problem # 4:   HYPERTENSION (ICD-401.9) Assessment: Improved  His updated medication list for this problem includes:    Tiazac 180 Mg Xr24h-cap (Diltiazem hcl er beads) .Marland Kitchen... Take 1 tablet by mouth once a day    Atenolol 50 Mg Tabs (Atenolol) .Marland Kitchen... Take 1/2  tablet by mouth two times a day  Orders: Venipuncture (91478) TLB-BMP (Basic Metabolic Panel-BMET) (80048-METABOL) TLB-TSH (Thyroid Stimulating Hormone) (84443-TSH) Tobacco use cessation intermediate 3-10 minutes (29562) TLB-CBC Platelet - w/Differential (85025-CBCD)  BP today: 138/76 Prior BP: 132/80 (01/17/2010)  10 Yr Risk Heart Disease: 18 %  Labs Reviewed: K+: 4.3 (05/17/2009) Creat: : 1.0 (05/17/2009)   Chol: 155 (05/17/2009)   HDL: 29.60 (05/17/2009)   LDL: 98 (05/17/2009)   TG: 135.0 (05/17/2009)  Problem # 5:  HYPOTHYROIDISM (ICD-244.9) Assessment: Unchanged  His updated medication list for this problem includes:    Synthroid 50 Mcg Tabs (Levothyroxine sodium) .Marland Kitchen... Take 1 tablet by mouth once a day  Orders: Venipuncture (11914) TLB-BMP (Basic Metabolic Panel-BMET) (80048-METABOL) TLB-TSH (Thyroid Stimulating Hormone) (84443-TSH) TLB-CBC Platelet - w/Differential (85025-CBCD)  Labs Reviewed: TSH: 1.56 (12/03/2009)    Chol: 155 (05/17/2009)   HDL: 29.60 (05/17/2009)   LDL: 98 (05/17/2009)   TG: 135.0 (05/17/2009)  Complete Medication List: 1)  Tiazac 180 Mg Xr24h-cap (Diltiazem hcl er beads) .... Take 1 tablet by mouth once a day 2)  Atenolol 50 Mg Tabs (Atenolol) .... Take 1/2  tablet by mouth two times a day 3)  Synthroid 50 Mcg Tabs (Levothyroxine sodium) .... Take 1 tablet by mouth once a day 4)  Fish Oil Oil (Fish oil) .Marland Kitchen.. 1  tab by mouth once daily 5)  Vitamin D  .... 2 qd 6)  Aspirin Ec 325 Mg Tbec (Aspirin) .... Take one tablet by mouth daily 7)  Tambocor 50 Mg Tabs (Flecainide acetate) .... Hold has not taken yet take on tablet by mouth twice a day start 5 days prior stress echo  Hypertension Assessment/Plan:       The patient's hypertensive risk group is category B: At least one risk factor (excluding diabetes) with no target organ damage.  His calculated 10 year risk of coronary heart disease is 18 %.  Today's blood pressure is 138/76.  His blood pressure goal is < 140/90.  Patient Instructions: 1)  Please schedule a follow-up appointment in 2 weeks. 2)  Tobacco is very bad for your health and your loved ones! You Should stop smoking!. 3)  Stop Smoking Tips: Choose a Quit date. Cut down before the Quit date. decide what you will do as a substitute when you feel the urge to smoke(gum,toothpick,exercise). 4)  Check your Blood Pressure regularly. If it is above 130/80: you should make an appointment.

## 2010-10-07 NOTE — Progress Notes (Signed)
  Records received from Dr. Ardelia Mems, [place].Records faxed to [facility]. She will have the records scanned into EMR for the patient's appointment. Dena Chavis  [name] 09, 2010 4:12 PM

## 2010-10-07 NOTE — Progress Notes (Signed)
  Phone Note Other Incoming   Caller: pt  Details for Reason: Call pt on cell [phone] Summary of Call: Pt is here this am for a flu and pneumonia vaccine. He states he took his BP this am and it was 149/88. FYI Initial call taken by: Ami Bullins CMA,  July 14, 2010 9:06 AM  Follow-up for Phone Call        ok Follow-up by: Etta Grandchild MD,  July 14, 2010 9:09 AM  Additional Follow-up for Phone Call Additional follow up Details #1::        spoke with pt and he states he was not fasting for the labs that he had done. He questions accurate reading of glucose due to the fact that he was not fasting. what do you advise Additional Follow-up by: Ami Bullins CMA,  July 14, 2010 4:43 PM    Additional Follow-up for Phone Call Additional follow up Details #2::    the blood sugar was an A1C so fasting makes no difference Follow-up by: Etta Grandchild MD,  July 14, 2010 2:54 PM  Additional Follow-up for Phone Call Additional follow up Details #3:: Details for Additional Follow-up Action Taken: informed pt and pt transfered to sch to set up appt/ ab Additional Follow-up by: Ami Bullins CMA,  July 15, 2010 10:18 AM

## 2010-10-07 NOTE — Progress Notes (Signed)
  Phone Note Refill Request Message from:  Fax from Pharmacy on June 24, 2010 12:47 PM  Refills Requested: Medication #1:  SYNTHROID 50 MCG TABS Take 1 tablet by mouth once a day   Dosage confirmed as above?Dosage Confirmed   Supply Requested: 1 month Initial call taken by: Rock Nephew CMA,  June 24, 2010 12:47 PM    Prescriptions: SYNTHROID 50 MCG TABS (LEVOTHYROXINE SODIUM) Take 1 tablet by mouth once a day  #90 Each x 1   Entered by:   Rock Nephew CMA   Authorized by:   Etta Grandchild MD   Signed by:   Rock Nephew CMA on 06/24/2010   Method used:   Electronically to        [facility] [address] [phone]* (retail)       [address]       [place], [place]  [address]       Ph: [phone] or [phone]       Fax: [phone]   RxID:   [id number]

## 2010-10-07 NOTE — Progress Notes (Signed)
Summary: question regarding meds  Phone Note Call from Patient Call back at Home Phone [phone] Call back at [phone]   Caller: Patient Reason for Call: Talk to Nurse Summary of Call: pt has question regarding new meds. flecainide 50 mg in am / pm.  Initial call taken by: Lorne Skeens,  [name] 13, 2010 10:00 AM  Follow-up for Phone Call        spoke with pt, he was supposed to have started flecainide on saturday and he did not start it because the side effects scared him. he is scheduled this week for an echo and stress echo and wanted to know if he still needed that testing. he is not interested in taking the flecainide unless the benefit of the med out ways the risk of side effects from the drug. will foward for dr Jens Som review [name], RN  [name] 13, 2010 4:49 PM   Additional Follow-up for Phone Call Additional follow up Details #1::        If he is not going to take flecanide, then cancel stress echo; still needs rest echo. Flecanide hopefully will decrease afib episodes. Ferman Hamming, MD, [facility]  [name] 13, 2010 5:24 PM  spoke with pt, he would like to see dr Jens Som and discuss the meds before starting the flecainide. stress echo canceled. the pt will have the echo and a follow up appt to discuss was made [name], RN  [name] 13, 2010 6:01 PM

## 2010-10-07 NOTE — Progress Notes (Signed)
Summary: ? re havin proc tom  Phone Note Call from Patient Call back at [phone]   Caller: Patient Call For: Arlyce Dice Reason for Call: Talk to Nurse Summary of Call: Patient has questions regarding heart monitor he has and if he should still have procedure tomorrow Initial call taken by: Tawni Levy,  November 04, 2009 9:04 AM  Follow-up for Phone Call        line busy when i returned called. will try again later. Follow-up by: Sherren Kerns RN,  November 04, 2009 9:26 AM    Additional Follow-up for Phone Call Additional follow up Details #2::    Talked with pt- he has a heart monitor that he has to wear for one month.  he wanted to know if it would interfere with his procedure.  He says that he can take it off at night if needed.  Told pt. that it would be okay for him to have procedure tomorrow. Follow-up by: Ezra Sites RN,  November 04, 2009 11:46 AM

## 2010-10-07 NOTE — Progress Notes (Signed)
Summary: returning call  Phone Note Call from Patient Call back at [phone]   Caller: Patient Reason for Call: Talk to Nurse Summary of Call: returning call Initial call taken by: Migdalia Dk,  February 25, 2010 8:39 AM  Follow-up for Phone Call        SPOKE WITH PT  INSTRUCTED EF WAS NORMAL  AND HEART GETTING GOOD BLOOD FLOW  INFORMED MD WANTED TO REVIEW EKG STRIPS  WILL CALL BACK WITH INFO ONCE  REVIEWED VERBALIZED UNDERSTANDING. Follow-up by: Scherrie Bateman, LPN,  February 25, 2010 8:45 AM  Additional Follow-up for Phone Call Additional follow up Details #1::        Rhythm strips ok Ferman Hamming, MD, [facility]  February 25, 2010 12:47 PM  pt aware [name], RN  February 26, 2010 12:02 PM

## 2010-10-07 NOTE — Assessment & Plan Note (Signed)
Summary: 1 mos f/u #/cd   Vital Signs:  Patient profile:   65 year old male Height:      72 inches Weight:      202.25 pounds BMI:     27.53 O2 Sat:      96 % on Room air Temp:     98.0 degrees F oral Pulse rate:   64 / minute Pulse rhythm:   regular Resp:     16 per minute BP sitting:   140 / 82  (left arm) Cuff size:   large  Vitals Entered By: Rock Nephew CMA (August 11, 2010 8:27 AM)  Nutrition Counseling: Patient's BMI is greater than 25 and therefore counseled on weight management options.  O2 Flow:  Room air CC: follow-up visit//discuss BP meds Is Patient Diabetic? No Pain Assessment Patient in pain? no       Does patient need assistance? Functional Status Self care Ambulation Normal   Primary Care Provider:  Etta Grandchild MD  CC:  follow-up visit//discuss BP meds.  History of Present Illness:  Hypertension Follow-Up      This is a 65 year old man who presents for Hypertension follow-up.  The patient denies lightheadedness, urinary frequency, headaches, edema, impotence, rash, and fatigue.  The patient denies the following associated symptoms: chest pain, chest pressure, exercise intolerance, dyspnea, palpitations, syncope, leg edema, and pedal edema.  Compliance with medications (by patient report) has been near 100%.  The patient reports that dietary compliance has been good.  The patient reports exercising 3-4X per week.  Adjunctive measures currently used by the patient include salt restriction and relaxation.    He wants an Rx for valium to help with his fear of flying. He is going to [employer] in [name]. 2012 to help his son move.  Preventive Screening-Counseling & Management  Alcohol-Tobacco     Alcohol drinks/day: 0     Alcohol Counseling: not indicated; patient does not drink     Smoking Status: current     Smoking Cessation Counseling: yes     Smoke Cessation Stage: precontemplative     Packs/Day: 0.75     Cigars/week: 14     Tobacco  Counseling: to quit use of tobacco products  Hep-HIV-STD-Contraception     Hepatitis Risk: no risk noted     HIV Risk: no risk noted     STD Risk: no risk noted      Sexual History:  currently monogamous.        Drug Use:  never.        Blood Transfusions:  no.    Clinical Review Panels:  Prevention   Last Colonoscopy:  DONE (11/05/2009)   Last PSA:  1.90 (05/17/2009)  Immunizations   Last Tetanus Booster:  Tdap (08/11/2010)   Last Flu Vaccine:  Fluvax 3+ (07/14/2010)   Last Pneumovax:  Pneumovax (07/14/2010)  Lipid Management   Cholesterol:  155 (05/17/2009)   LDL (bad choesterol):  98 (05/17/2009)   HDL (good cholesterol):  29.60 (05/17/2009)  Diabetes Management   HgBA1C:  6.1 (07/11/2010)   Creatinine:  1.1 (07/11/2010)   Last Flu Vaccine:  Fluvax 3+ (07/14/2010)   Last Pneumovax:  Pneumovax (07/14/2010)  CBC   WBC:  8.2 (07/11/2010)   RBC:  5.49 (07/11/2010)   Hgb:  17.6 (07/11/2010)   Hct:  50.5 (07/11/2010)   Platelets:  229.0 (07/11/2010)   MCV  91.9 (07/11/2010)   MCHC  34.9 (07/11/2010)   RDW  12.2 (07/11/2010)  PMN:  66.2 (07/11/2010)   Lymphs:  21.7 (07/11/2010)   Monos:  10.2 (07/11/2010)   Eosinophils:  1.6 (07/11/2010)   Basophil:  0.3 (07/11/2010)  Complete Metabolic Panel   Glucose:  123 (07/11/2010)   Sodium:  140 (07/11/2010)   Potassium:  4.1 (07/11/2010)   Chloride:  103 (07/11/2010)   CO2:  31 (07/11/2010)   BUN:  14 (07/11/2010)   Creatinine:  1.1 (07/11/2010)   Albumin:  3.9 (07/11/2010)   Total Protein:  6.9 (07/11/2010)   Calcium:  9.2 (07/11/2010)   Total Bili:  1.3 (07/11/2010)   Alk Phos:  67 (07/11/2010)   SGPT (ALT):  20 (07/11/2010)   SGOT (AST):  23 (07/11/2010)   Medications Prior to Update: 1)  Tiazac 180 Mg Xr24h-Cap (Diltiazem Hcl Er Beads) .... Take 1 Tablet By Mouth Once A Day 2)  Synthroid 50 Mcg Tabs (Levothyroxine Sodium) .... Take 1 Tablet By Mouth Once A Day 3)  Fish Oil   Oil (Fish Oil) .Marland Kitchen.. 1  Tab By  Mouth Once Daily 4)  Vitamin D .... 2 Qd 5)  Aspirin Ec 325 Mg Tbec (Aspirin) .... Take One Tablet By Mouth Daily 6)  Tambocor 50 Mg Tabs (Flecainide Acetate) .... 2 Tab By Mouth Once Daily 7)  Diovan 160 Mg Tabs (Valsartan) .... One By Mouth Once Daily For High Blood Pressure 8)  Bystolic 5 Mg Tabs (Nebivolol Hcl) .... One By Mouth Once Daily For High Blood Pressure  Current Medications (verified): 1)  Tiazac 180 Mg Xr24h-Cap (Diltiazem Hcl Er Beads) .... Take 1 Tablet By Mouth Once A Day 2)  Synthroid 50 Mcg Tabs (Levothyroxine Sodium) .... Take 1 Tablet By Mouth Once A Day 3)  Fish Oil   Oil (Fish Oil) .Marland Kitchen.. 1  Tab By Mouth Once Daily 4)  Vitamin D .... 2 Qd 5)  Aspirin Ec 325 Mg Tbec (Aspirin) .... Take One Tablet By Mouth Daily 6)  Tambocor 50 Mg Tabs (Flecainide Acetate) .... 2 Tab By Mouth Once Daily 7)  Diovan 160 Mg Tabs (Valsartan) .... One By Mouth Once Daily For High Blood Pressure 8)  Bystolic 5 Mg Tabs (Nebivolol Hcl) .... One By Mouth Once Daily For High Blood Pressure 9)  Diazepam 5 Mg Tabs (Diazepam) .... 1/2 -1 By Mouth Three Times A Day As Needed For Fear of Flying  Allergies (verified): 1)  ! Penicillin  Past History:  Past Medical History: Last updated: 12/31/2009 ATRIAL FIBRILLATION (ICD-427.31) HYPERTENSION (ICD-401.9) EXTERNAL THROMBOSED HEMORRHOIDS (ICD-455.4) COLONIC POLYPS, HX OF (ICD-V12.72) BENIGN PROSTATIC HYPERTROPHY (ICD-600.00) HYPOTHYROIDISM (ICD-244.9) ANXIETY (ICD-300.00) Low HDL Nephrolithiasis  Past Surgical History: Last updated: 12/31/2009 Inguinal herniorrhaphy Tonsillectomy Partial thyroidectomy Appendectomy  Family History: Last updated: 12/31/2009 Family History of Alcoholism/Addiction Family History Breast cancer 1st degree relative <50 Father died of AAA; MI at age 40  Social History: Last updated: 12/31/2009 Occupation: truck driver Married Current Smoker - 2 cigars per day Alcohol use-none Drug use-no Regular  exercise-yes  Risk Factors: Alcohol Use: 0 (08/11/2010) Exercise: yes (05/17/2009)  Risk Factors: Smoking Status: current (08/11/2010) Packs/Day: 0.75 (08/11/2010) Cigars/wk: 14 (08/11/2010)  Family History: Reviewed history from 12/31/2009 and no changes required. Family History of Alcoholism/Addiction Family History Breast cancer 1st degree relative <50 Father died of AAA; MI at age 81  Social History: Reviewed history from 12/31/2009 and no changes required. Occupation: truck Hospital doctor Married Current Smoker - 2 cigars per day Alcohol use-none Drug use-no Regular exercise-yes  Review of Systems  The patient denies anorexia,  fever, weight loss, weight gain, chest pain, syncope, peripheral edema, prolonged cough, headaches, hemoptysis, abdominal pain, hematuria, suspicious skin lesions, transient blindness, difficulty walking, and depression.   CV:  Denies chest pain or discomfort, difficulty breathing at night, fainting, fatigue, leg cramps with exertion, lightheadness, near fainting, palpitations, shortness of breath with exertion, and swelling of feet. Endo:  Denies cold intolerance, excessive hunger, excessive thirst, excessive urination, heat intolerance, polyuria, and weight change.  Physical Exam  General:  alert, well-developed, well-nourished, well-hydrated, appropriate dress, normal appearance, healthy-appearing, cooperative to examination, and good hygiene.   Head:  normocephalic, atraumatic, no abnormalities observed, and no abnormalities palpated.   Mouth:  Oral mucosa and oropharynx without lesions or exudates.  Teeth in good repair. Neck:  Over his right lateral, lower neck there is a discreet sub-q mass that measures about 3 cm that feels like a lymph node. It feels fixed but is non-tender, non-fluctuant, and non-indurated. the overlying skin is normal. Lungs:  normal respiratory effort, no intercostal retractions, no accessory muscle use, normal breath sounds, no  dullness, no fremitus, no crackles, and no wheezes.   Heart:  normal rate, regular rhythm, no murmur, no gallop, no rub, and no JVD.   Abdomen:  soft, non-tender, normal bowel sounds, no distention, no masses, no guarding, no rigidity, no rebound tenderness, no abdominal hernia, no inguinal hernia, no hepatomegaly, and no splenomegaly.   Msk:  normal ROM, no joint tenderness, no joint swelling, no joint warmth, no redness over joints, and no joint deformities.   Pulses:  R and L carotid,radial,femoral,dorsalis pedis and posterior tibial pulses are full and equal bilaterally Extremities:  No clubbing, cyanosis, edema, or deformity noted with normal full range of motion of all joints.   Neurologic:  alert & oriented X3, cranial nerves II-XII intact, strength normal in all extremities, sensation intact to light touch, sensation intact to pinprick, gait normal, DTRs symmetrical and normal, and finger-to-nose normal.   Skin:  turgor normal, no rashes, no suspicious lesions, no ecchymoses, no petechiae, no purpura, no ulcerations, no edema, excessive tan, solar damage, and seborrheic keratosis.   Cervical Nodes:  no anterior cervical adenopathy and no posterior cervical adenopathy.   Psych:  Oriented X3, memory intact for recent and remote, normally interactive, good eye contact, not anxious appearing, not depressed appearing, not agitated, and not suicidal.     Impression & Recommendations:  Problem # 1:  HYPERTENSION (ICD-401.9) Assessment Unchanged  His updated medication list for this problem includes:    Tiazac 180 Mg Xr24h-cap (Diltiazem hcl er beads) .Marland Kitchen... Take 1 tablet by mouth once a day    Diovan 160 Mg Tabs (Valsartan) ..... One by mouth once daily for high blood pressure    Bystolic 5 Mg Tabs (Nebivolol hcl) ..... One by mouth once daily for high blood pressure  Orders: Venipuncture (01751) TLB-Lipid Panel (80061-LIPID) TLB-BMP (Basic Metabolic Panel-BMET) (80048-METABOL)  BP today:  140/82 Prior BP: 122/64 (07/16/2010)  Prior 10 Yr Risk Heart Disease: 18 % (02/06/2010)  Labs Reviewed: K+: 4.1 (07/11/2010) Creat: : 1.1 (07/11/2010)   Chol: 155 (05/17/2009)   HDL: 29.60 (05/17/2009)   LDL: 98 (05/17/2009)   TG: 135.0 (05/17/2009)  Problem # 2:  ATRIAL FIBRILLATION (ICD-427.31) Assessment: Improved  His updated medication list for this problem includes:    Tiazac 180 Mg Xr24h-cap (Diltiazem hcl er beads) .Marland Kitchen... Take 1 tablet by mouth once a day    Aspirin Ec 325 Mg Tbec (Aspirin) .Marland Kitchen... Take one tablet by mouth daily  Tambocor 50 Mg Tabs (Flecainide acetate) .Marland Kitchen... 2 tab by mouth once daily    Bystolic 5 Mg Tabs (Nebivolol hcl) ..... One by mouth once daily for high blood pressure  Problem # 3:  HYPOTHYROIDISM (ICD-244.9) Assessment: Improved  His updated medication list for this problem includes:    Synthroid 50 Mcg Tabs (Levothyroxine sodium) .Marland Kitchen... Take 1 tablet by mouth once a day  Labs Reviewed: TSH: 1.81 (07/11/2010)    HgBA1c: 6.1 (07/11/2010) Chol: 155 (05/17/2009)   HDL: 29.60 (05/17/2009)   LDL: 98 (05/17/2009)   TG: 135.0 (05/17/2009)  Problem # 4:  ANXIETY (ICD-300.00) Assessment: Unchanged  His updated medication list for this problem includes:    Diazepam 5 Mg Tabs (Diazepam) .Marland Kitchen... 1/2 -1 by mouth three times a day as needed for fear of flying  Complete Medication List: 1)  Tiazac 180 Mg Xr24h-cap (Diltiazem hcl er beads) .... Take 1 tablet by mouth once a day 2)  Synthroid 50 Mcg Tabs (Levothyroxine sodium) .... Take 1 tablet by mouth once a day 3)  Fish Oil Oil (Fish oil) .Marland Kitchen.. 1  tab by mouth once daily 4)  Vitamin D  .... 2 qd 5)  Aspirin Ec 325 Mg Tbec (Aspirin) .... Take one tablet by mouth daily 6)  Tambocor 50 Mg Tabs (Flecainide acetate) .... 2 tab by mouth once daily 7)  Diovan 160 Mg Tabs (Valsartan) .... One by mouth once daily for high blood pressure 8)  Bystolic 5 Mg Tabs (Nebivolol hcl) .... One by mouth once daily for high blood  pressure 9)  Diazepam 5 Mg Tabs (Diazepam) .... 1/2 -1 by mouth three times a day as needed for fear of flying  Other Orders: Tdap => 29yrs IM (52841) Admin 1st Vaccine (32440)  Patient Instructions: 1)  Please schedule a follow-up appointment in 3 months. 2)  It is important that you exercise regularly at least 20 minutes 5 times a week. If you develop chest pain, have severe difficulty breathing, or feel very tired , stop exercising immediately and seek medical attention. 3)  You need to lose weight. Consider a lower calorie diet and regular exercise.  4)  Check your Blood Pressure regularly. If it is above: you should make an appointment. Prescriptions: DIAZEPAM 5 MG TABS (DIAZEPAM) 1/2 -1 by mouth three times a day as needed for fear of flying  #15 x 0   Entered and Authorized by:   Etta Grandchild MD   Signed by:   Etta Grandchild MD on 08/11/2010   Method used:   Print then Give to Patient   RxID:   [id number]    Orders Added: 1)  Tdap => 6yrs IM [25956] 2)  Admin 1st Vaccine [90471] 3)  Venipuncture [38756] 4)  TLB-Lipid Panel [80061-LIPID] 5)  TLB-BMP (Basic Metabolic Panel-BMET) [80048-METABOL] 6)  Est. Patient Level IV [43329]   Immunizations Administered:  Tetanus Vaccine:    Vaccine Type: Tdap    Site: left deltoid    Mfr: GlaxoSmithKline    Dose: 0.5 ml    Route: IM    Given by: Rock Nephew CMA    Exp. Date: 06/26/2012    Lot #: [id number]    VIS given: 07/25/08 version given August 11, 2010.   Immunizations Administered:  Tetanus Vaccine:    Vaccine Type: Tdap    Site: left deltoid    Mfr: GlaxoSmithKline    Dose: 0.5 ml    Route: IM    Given by: Rock Nephew  CMA    Exp. Date: 06/26/2012    Lot #: [id number]    VIS given: 07/25/08 version given August 11, 2010.

## 2010-10-07 NOTE — Procedures (Signed)
Summary: Holter  Holter   Imported By: Marylou Mccoy 11/04/2009 16:45:05  _____________________________________________________________________  External Attachment:    Type:   Image     Comment:   External Document

## 2010-10-07 NOTE — Letter (Signed)
Summary: [facility] Office Note  Diagnostic Clinic Office Note   Imported By: Roderic Ovens 01/29/2010 16:29:41  _____________________________________________________________________  External Attachment:    Type:   Image     Comment:   External Document

## 2010-10-07 NOTE — Procedures (Signed)
Summary: Colonoscopy  Patient: Kevin Bradley Note: All result statuses are Final unless otherwise noted.  Tests: (1) Colonoscopy (COL)   COL Colonoscopy           DONE     [facility]     [address]     [place], [place]  [address]           COLONOSCOPY PROCEDURE REPORT           PATIENT:  [name], [name]  MR#:  [id number]     BIRTHDATE:  September 27, 1945, 63 yrs. old  GENDER:  male           ENDOSCOPIST:  Barbette Hair. Arlyce Dice, MD     Referred by:  Etta Grandchild, M.D.           PROCEDURE DATE:  11/05/2009     PROCEDURE:  Colon with cold biopsy polypectomy     ASA CLASS:  Class II     INDICATIONS:  history of pre-cancerous (adenomatous) colon polyps                 MEDICATIONS:   Fentanyl 75 mcg IV, Versed 7 mg IV           DESCRIPTION OF PROCEDURE:   After the risks benefits and     alternatives of the procedure were thoroughly explained, informed     consent was obtained.  Digital rectal exam was performed and     revealed no abnormalities.   The LB [id number] endoscope     was introduced through the anus and advanced to the cecum, which     was identified by both the appendix and ileocecal valve, without     limitations.  The quality of the prep was excellent, using     MoviPrep.  The instrument was then slowly withdrawn as the colon     was fully examined.     <<PROCEDUREIMAGES>>           FINDINGS:  A sessile polyp was found in the cecum. It was 2 mm in     size. The polyp was removed using cold biopsy forceps (see     image2).  Scattered diverticula were found in the sigmoid colon     (see image1 and image6).  This was otherwise a normal examination     of the colon (see image3, image4, image5, image7, image9, image11,     image14, image18, and image19).   Retroflexed views in the rectum     revealed no abnormalities.    The scope was then withdrawn from     the patient and the procedure completed.           COMPLICATIONS:  None           ENDOSCOPIC  IMPRESSION:     1) 2 mm sessile polyp in the cecum     2) Diverticula, scattered in the sigmoid colon     3) Otherwise normal examination     RECOMMENDATIONS:     1) If the polyp(s) removed today are proven to be adenomatous     (pre-cancerous) polyps, you will need a repeat colonoscopy in 5     years. Otherwise you should   followup with colonoscopy in 7years.                 REPEAT EXAM:   You will receive a letter from Dr. Arlyce Dice in 1-2     weeks, after reviewing the  final pathology, with followup     recommendations.           ______________________________     Barbette Hair Arlyce Dice, MD           CC:           n.     eSIGNED:   Barbette Hair. Caeleb Batalla at 11/05/2009 11:56 AM           Eddie North, [id number]  Note: An exclamation mark (!) indicates a result that was not dispersed into the flowsheet. Document Creation Date: 11/05/2009 11:57 AM _______________________________________________________________________  (1) Order result status: Final Collection or observation date-time: 11/05/2009 11:48 Requested date-time:  Receipt date-time:  Reported date-time:  Referring Physician:   Ordering Physician: Melvia Heaps [id number]) Specimen Source:  Source: Launa Grill Order Number: [id number] Lab site:   Appended Document: Colonoscopy     Procedures Next Due Date:    Colonoscopy: 11/2014

## 2010-10-07 NOTE — Assessment & Plan Note (Signed)
Summary: rov/f/u atrial fib/dm   Primary Provider:  Etta Grandchild MD  CC:  no complaints.  History of Present Illness: 65 year old male I saw in April 2011 for evaluation of atrial fibrillation. Note TSH in March of 2011 was normal at 1.56. A CardioNet monitor in February revealed sinus rhythm with occasional bursts of atrial fibrillation.  The patient states he has had paroxysmal atrial fibrillation for 40 years. He had an episode of chest pain approximately 9 years ago and had a cardiac catheterization that was normal by his report. He previously lived in [place]. He had an exercise stress test approximately one year ago that was normal. He has had a monitor placed previously that revealed paroxysmal atrial fibrillation associated palpitations. We did plan to add flecainide previously but he decided not to pursue this. An echocardiogram in May of 2011 revealed normal LV function, trivial aortic insufficiency and mild left atrial enlargement. Since I saw him previously he denies dyspnea, chest pain, syncope or episodes of atrial fibrillation. He occasionally feels a brief skip.  Current Medications (verified): 1)  Tiazac 180 Mg Xr24h-Cap (Diltiazem Hcl Er Beads) .... Take 1 Tablet By Mouth Once A Day 2)  Atenolol 50 Mg Tabs (Atenolol) .... Take 1/2  Tablet By Mouth Two Times A Day 3)  Synthroid 50 Mcg Tabs (Levothyroxine Sodium) .... Take 1 Tablet By Mouth Once A Day 4)  Fish Oil   Oil (Fish Oil) .Marland Kitchen.. 1  Tab By Mouth Once Daily 5)  Vitamin D .... 2 Qd 6)  Aspirin Ec 325 Mg Tbec (Aspirin) .... Take One Tablet By Mouth Daily 7)  Tambocor 50 Mg Tabs (Flecainide Acetate) .... Hold Has Not Taken Yet Take On Tablet By Mouth Twice A Day Start 5 Days Prior Stress Echo  Allergies: 1)  ! Penicillin  Past History:  Past Medical History: Reviewed history from 12/31/2009 and no changes required. ATRIAL FIBRILLATION (ICD-427.31) HYPERTENSION (ICD-401.9) EXTERNAL THROMBOSED HEMORRHOIDS  (ICD-455.4) COLONIC POLYPS, HX OF (ICD-V12.72) BENIGN PROSTATIC HYPERTROPHY (ICD-600.00) HYPOTHYROIDISM (ICD-244.9) ANXIETY (ICD-300.00) Low HDL Nephrolithiasis  Past Surgical History: Reviewed history from 12/31/2009 and no changes required. Inguinal herniorrhaphy Tonsillectomy Partial thyroidectomy Appendectomy  Social History: Reviewed history from 12/31/2009 and no changes required. Occupation: truck Hospital doctor Married Current Smoker - 2 cigars per day Alcohol use-none Drug use-no Regular exercise-yes  Review of Systems       no fevers or chills, productive cough, hemoptysis, dysphasia, odynophagia, melena, hematochezia, dysuria, hematuria, rash, seizure activity, orthopnea, PND, pedal edema, claudication. Remaining systems are negative.   Vital Signs:  Patient profile:   65 year old male Height:      72 inches Weight:      201 pounds BMI:     27.36 Pulse rate:   60 / minute Resp:     14 per minute BP sitting:   132 / 80  (left arm)  Vitals Entered By: Kem Parkinson ([name] 17, 2010 3:58 PM)  Physical Exam  General:  Well-developed well-nourished in no acute distress.  Skin is warm and dry.  HEENT is normal.  Neck is supple. No thyromegaly.  Chest is clear to auscultation with normal expansion.  Cardiovascular exam is regular rate and rhythm.  Abdominal exam nontender or distended. No masses palpated. Extremities show no edema. neuro grossly intact    Impression & Recommendations:  Problem # 1:  ATRIAL FIBRILLATION (ICD-427.31) I reviewed a fibrillation again with the patient. He was concerned about the possibility of side effects . I explained that  the major concern was exercised induced arrhythmias. However these are very uncommon. He now is agreeable to try flecainide. I will begin 50 mg p.o. b.i.d. 5 days later he will have a stress echocardiogram both to exclude exercise-induced ventricular tachycardia and ischemia. He will continue on his beta blocker  and calcium blocker. He will continue on aspirin as his only embolic risk factor is hypertension. His updated medication list for this problem includes:    Atenolol 50 Mg Tabs (Atenolol) .Marland Kitchen... Take 1/2  tablet by mouth two times a day    Aspirin Ec 325 Mg Tbec (Aspirin) .Marland Kitchen... Take one tablet by mouth daily    Tambocor 50 Mg Tabs (Flecainide acetate) ..... Hold has not taken yet take on tablet by mouth twice a day start 5 days prior stress echo  Problem # 2:  HYPERTENSION (ICD-401.9) Blood pressure controlled on present medications. Will continue. His updated medication list for this problem includes:    Tiazac 180 Mg Xr24h-cap (Diltiazem hcl er beads) .Marland Kitchen... Take 1 tablet by mouth once a day    Atenolol 50 Mg Tabs (Atenolol) .Marland Kitchen... Take 1/2  tablet by mouth two times a day    Aspirin Ec 325 Mg Tbec (Aspirin) .Marland Kitchen... Take one tablet by mouth daily  Problem # 3:  TOBACCO ABUSE (ICD-305.1)  Problem # 4:  BENIGN PROSTATIC HYPERTROPHY (ICD-600.00)  Problem # 5:  HYPOTHYROIDISM (ICD-244.9)  His updated medication list for this problem includes:    Synthroid 50 Mcg Tabs (Levothyroxine sodium) .Marland Kitchen... Take 1 tablet by mouth once a day  Other Orders: Stress Echo (Stress Echo)  Patient Instructions: 1)  Your physician recommends that you schedule a follow-up appointment in: 3 months 2)  Your physician has recommended you make the following change in your medication: start flecainide 5 days prior to the stress test 3)  Your physician has requested that you have a stress echocardiogram. For further information please visit [email or web address]  Please follow instruction sheet as given.

## 2010-10-07 NOTE — Assessment & Plan Note (Signed)
Summary: discuss A1C/#/CD   Vital Signs:  Patient profile:   65 year old male Height:      72 inches Weight:      200 pounds BMI:     27.22 O2 Sat:      96 % Temp:     98.0 degrees F oral Pulse rate:   44 / minute Pulse rhythm:   regular Resp:     16 per minute BP sitting:   122 / 64  (left arm) Cuff size:   large  Vitals Entered By: Rock Nephew CMA (July 16, 2010 8:51 AM)  Nutrition Counseling: Patient's BMI is greater than 25 and therefore counseled on weight management options. CC: follow-up visit//lab results Is Patient Diabetic? Yes Did you bring your meter with you today? No Pain Assessment Patient in pain? no       Does patient need assistance? Functional Status Self care Ambulation Normal   Primary Care Provider:  Etta Grandchild MD  CC:  follow-up visit//lab results.  History of Present Illness:  Follow-Up Visit      This is a 65 year old man who presents for Follow-up visit.  The patient denies chest pain, palpitations, dizziness, syncope, low blood sugar symptoms, high blood sugar symptoms, edema, SOB, DOE, PND, and orthopnea.  Since the last visit the patient notes problems with medications.  The patient reports taking meds as prescribed, monitoring BP, and dietary noncompliance.  When questioned about possible medication side effects, the patient notes none.    Preventive Screening-Counseling & Management  Alcohol-Tobacco     Alcohol drinks/day: 0     Alcohol Counseling: not indicated; patient does not drink     Smoking Status: current     Smoking Cessation Counseling: yes     Smoke Cessation Stage: precontemplative     Packs/Day: 0.75     Cigars/week: 14     Tobacco Counseling: to quit use of tobacco products  Hep-HIV-STD-Contraception     Hepatitis Risk: no risk noted     HIV Risk: no risk noted     STD Risk: no risk noted      Sexual History:  currently monogamous.        Drug Use:  never.        Blood Transfusions:  no.     Clinical Review Panels:  Prevention   Last Colonoscopy:  DONE (11/05/2009)   Last PSA:  1.90 (05/17/2009)  Immunizations   Last Flu Vaccine:  Fluvax 3+ (07/14/2010)   Last Pneumovax:  Pneumovax (07/14/2010)  Lipid Management   Cholesterol:  155 (05/17/2009)   LDL (bad choesterol):  98 (05/17/2009)   HDL (good cholesterol):  29.60 (05/17/2009)  Diabetes Management   HgBA1C:  6.1 (07/11/2010)   Creatinine:  1.1 (07/11/2010)   Last Flu Vaccine:  Fluvax 3+ (07/14/2010)   Last Pneumovax:  Pneumovax (07/14/2010)  CBC   WBC:  8.2 (07/11/2010)   RBC:  5.49 (07/11/2010)   Hgb:  17.6 (07/11/2010)   Hct:  50.5 (07/11/2010)   Platelets:  229.0 (07/11/2010)   MCV  91.9 (07/11/2010)   MCHC  34.9 (07/11/2010)   RDW  12.2 (07/11/2010)   PMN:  66.2 (07/11/2010)   Lymphs:  21.7 (07/11/2010)   Monos:  10.2 (07/11/2010)   Eosinophils:  1.6 (07/11/2010)   Basophil:  0.3 (07/11/2010)  Complete Metabolic Panel   Glucose:  123 (07/11/2010)   Sodium:  140 (07/11/2010)   Potassium:  4.1 (07/11/2010)   Chloride:  103 (07/11/2010)  CO2:  31 (07/11/2010)   BUN:  14 (07/11/2010)   Creatinine:  1.1 (07/11/2010)   Albumin:  3.9 (07/11/2010)   Total Protein:  6.9 (07/11/2010)   Calcium:  9.2 (07/11/2010)   Total Bili:  1.3 (07/11/2010)   Alk Phos:  67 (07/11/2010)   SGPT (ALT):  20 (07/11/2010)   SGOT (AST):  23 (07/11/2010)   Medications Prior to Update: 1)  Tiazac 180 Mg Xr24h-Cap (Diltiazem Hcl Er Beads) .... Take 1 Tablet By Mouth Once A Day 2)  Synthroid 50 Mcg Tabs (Levothyroxine Sodium) .... Take 1 Tablet By Mouth Once A Day 3)  Fish Oil   Oil (Fish Oil) .Marland Kitchen.. 1  Tab By Mouth Once Daily 4)  Vitamin D .... 2 Qd 5)  Aspirin Ec 325 Mg Tbec (Aspirin) .... Take One Tablet By Mouth Daily 6)  Tambocor 50 Mg Tabs (Flecainide Acetate) .... 2 Tab By Mouth Once Daily 7)  Bystolic 10 Mg Tabs (Nebivolol Hcl) .... One By Mouth Once Daily For High Blood Pressure 8)  Diovan 160 Mg Tabs  (Valsartan) .... One By Mouth Once Daily For High Blood Pressure  Current Medications (verified): 1)  Tiazac 180 Mg Xr24h-Cap (Diltiazem Hcl Er Beads) .... Take 1 Tablet By Mouth Once A Day 2)  Synthroid 50 Mcg Tabs (Levothyroxine Sodium) .... Take 1 Tablet By Mouth Once A Day 3)  Fish Oil   Oil (Fish Oil) .Marland Kitchen.. 1  Tab By Mouth Once Daily 4)  Vitamin D .... 2 Qd 5)  Aspirin Ec 325 Mg Tbec (Aspirin) .... Take One Tablet By Mouth Daily 6)  Tambocor 50 Mg Tabs (Flecainide Acetate) .... 2 Tab By Mouth Once Daily 7)  Diovan 160 Mg Tabs (Valsartan) .... One By Mouth Once Daily For High Blood Pressure 8)  Bystolic 5 Mg Tabs (Nebivolol Hcl) .... One By Mouth Once Daily For High Blood Pressure  Allergies (verified): 1)  ! Penicillin  Past History:  Past Medical History: Last updated: 12/31/2009 ATRIAL FIBRILLATION (ICD-427.31) HYPERTENSION (ICD-401.9) EXTERNAL THROMBOSED HEMORRHOIDS (ICD-455.4) COLONIC POLYPS, HX OF (ICD-V12.72) BENIGN PROSTATIC HYPERTROPHY (ICD-600.00) HYPOTHYROIDISM (ICD-244.9) ANXIETY (ICD-300.00) Low HDL Nephrolithiasis  Past Surgical History: Last updated: 12/31/2009 Inguinal herniorrhaphy Tonsillectomy Partial thyroidectomy Appendectomy  Family History: Last updated: 12/31/2009 Family History of Alcoholism/Addiction Family History Breast cancer 1st degree relative <50 Father died of AAA; MI at age 66  Social History: Last updated: 12/31/2009 Occupation: truck driver Married Current Smoker - 2 cigars per day Alcohol use-none Drug use-no Regular exercise-yes  Risk Factors: Alcohol Use: 0 (07/16/2010) Exercise: yes (05/17/2009)  Risk Factors: Smoking Status: current (07/16/2010) Packs/Day: 0.75 (07/16/2010) Cigars/wk: 14 (07/16/2010)  Family History: Reviewed history from 12/31/2009 and no changes required. Family History of Alcoholism/Addiction Family History Breast cancer 1st degree relative <50 Father died of AAA; MI at age 37  Social  History: Reviewed history from 12/31/2009 and no changes required. Occupation: truck Hospital doctor Married Current Smoker - 2 cigars per day Alcohol use-none Drug use-no Regular exercise-yes  Review of Systems       The patient complains of weight gain.  The patient denies chest pain, syncope, dyspnea on exertion, peripheral edema, prolonged cough, headaches, hemoptysis, abdominal pain, and difficulty walking.   CV:  Denies chest pain or discomfort, fainting, fatigue, leg cramps with exertion, lightheadness, near fainting, palpitations, shortness of breath with exertion, and swelling of feet. Endo:  Denies cold intolerance, excessive hunger, excessive thirst, excessive urination, heat intolerance, polyuria, and weight change.  Physical Exam  General:  alert,  well-developed, well-nourished, well-hydrated, appropriate dress, normal appearance, healthy-appearing, cooperative to examination, and good hygiene.   Head:  normocephalic, atraumatic, no abnormalities observed, and no abnormalities palpated.   Mouth:  Oral mucosa and oropharynx without lesions or exudates.  Teeth in good repair. Neck:  Over his right lateral, lower neck there is a discreet sub-q mass that measures about 3 cm that feels like a lymph node. It feels fixed but is non-tender, non-fluctuant, and non-indurated. the overlying skin is normal. Lungs:  normal respiratory effort, no intercostal retractions, no accessory muscle use, normal breath sounds, no dullness, no fremitus, no crackles, and no wheezes.   Heart:  normal rate, regular rhythm, no murmur, no gallop, no rub, and no JVD.   Abdomen:  soft, non-tender, normal bowel sounds, no distention, no masses, no guarding, no rigidity, no rebound tenderness, no abdominal hernia, no inguinal hernia, no hepatomegaly, and no splenomegaly.   Msk:  normal ROM, no joint tenderness, no joint swelling, no joint warmth, no redness over joints, and no joint deformities.   Pulses:  R and L  carotid,radial,femoral,dorsalis pedis and posterior tibial pulses are full and equal bilaterally Extremities:  No clubbing, cyanosis, edema, or deformity noted with normal full range of motion of all joints.   Neurologic:  alert & oriented X3, cranial nerves II-XII intact, strength normal in all extremities, sensation intact to light touch, sensation intact to pinprick, gait normal, DTRs symmetrical and normal, and finger-to-nose normal.   Skin:  turgor normal, no rashes, no suspicious lesions, no ecchymoses, no petechiae, no purpura, no ulcerations, no edema, excessive tan, solar damage, and seborrheic keratosis.   Psych:  Oriented X3, memory intact for recent and remote, normally interactive, good eye contact, not anxious appearing, not depressed appearing, not agitated, and not suicidal.     Impression & Recommendations:  Problem # 1:  HYPERGLYCEMIA (ICD-790.29) Assessment Deteriorated he is an early onset type II diabetic, he will work on lifestyle modification with exercise and weight loss  Problem # 2:  TOBACCO ABUSE (ICD-305.1) Assessment: Unchanged  Encouraged smoking cessation and discussed different methods for smoking cessation.   Problem # 3:  ATRIAL FIBRILLATION (ICD-427.31) Assessment: Improved  The following medications were removed from the medication list:    Bystolic 10 Mg Tabs (Nebivolol hcl) ..... One by mouth once daily for high blood pressure His updated medication list for this problem includes:    Tiazac 180 Mg Xr24h-cap (Diltiazem hcl er beads) .Marland Kitchen... Take 1 tablet by mouth once a day    Aspirin Ec 325 Mg Tbec (Aspirin) .Marland Kitchen... Take one tablet by mouth daily    Tambocor 50 Mg Tabs (Flecainide acetate) .Marland Kitchen... 2 tab by mouth once daily    Bystolic 5 Mg Tabs (Nebivolol hcl) ..... One by mouth once daily for high blood pressure  Problem # 4:  HYPERTENSION (ICD-401.9) Assessment: Improved his heart rate is low so I will decrease the dose of Bystolic The following  medications were removed from the medication list:    Bystolic 10 Mg Tabs (Nebivolol hcl) ..... One by mouth once daily for high blood pressure His updated medication list for this problem includes:    Tiazac 180 Mg Xr24h-cap (Diltiazem hcl er beads) .Marland Kitchen... Take 1 tablet by mouth once a day    Diovan 160 Mg Tabs (Valsartan) ..... One by mouth once daily for high blood pressure    Bystolic 5 Mg Tabs (Nebivolol hcl) ..... One by mouth once daily for high blood pressure  Problem #  5:  HYPOTHYROIDISM (ICD-244.9) Assessment: Unchanged  His updated medication list for this problem includes:    Synthroid 50 Mcg Tabs (Levothyroxine sodium) .Marland Kitchen... Take 1 tablet by mouth once a day  Labs Reviewed: TSH: 1.81 (07/11/2010)    HgBA1c: 6.1 (07/11/2010) Chol: 155 (05/17/2009)   HDL: 29.60 (05/17/2009)   LDL: 98 (05/17/2009)   TG: 135.0 (05/17/2009)  Complete Medication List: 1)  Tiazac 180 Mg Xr24h-cap (Diltiazem hcl er beads) .... Take 1 tablet by mouth once a day 2)  Synthroid 50 Mcg Tabs (Levothyroxine sodium) .... Take 1 tablet by mouth once a day 3)  Fish Oil Oil (Fish oil) .Marland Kitchen.. 1  tab by mouth once daily 4)  Vitamin D  .... 2 qd 5)  Aspirin Ec 325 Mg Tbec (Aspirin) .... Take one tablet by mouth daily 6)  Tambocor 50 Mg Tabs (Flecainide acetate) .... 2 tab by mouth once daily 7)  Diovan 160 Mg Tabs (Valsartan) .... One by mouth once daily for high blood pressure 8)  Bystolic 5 Mg Tabs (Nebivolol hcl) .... One by mouth once daily for high blood pressure  Patient Instructions: 1)  Please schedule a follow-up appointment in 1 month. 2)  Tobacco is very bad for your health and your loved ones! You Should stop smoking!. 3)  Stop Smoking Tips: Choose a Quit date. Cut down before the Quit date. decide what you will do as a substitute when you feel the urge to smoke(gum,toothpick,exercise). 4)  It is important that you exercise regularly at least 20 minutes 5 times a week. If you develop chest pain, have  severe difficulty breathing, or feel very tired , stop exercising immediately and seek medical attention. 5)  You need to lose weight. Consider a lower calorie diet and regular exercise.  6)  Check your blood sugars regularly. If your readings are usually above 200 or below 70 you should contact our office. 7)  It is important that your Diabetic A1c level is checked every 3 months. 8)  See your eye doctor yearly to check for diabetic eye damage. 9)  Check your feet each night for sore areas, calluses or signs of infection. 10)  Check your Blood Pressure regularly. If it is above 130/80: you should make an appointment. Prescriptions: BYSTOLIC 5 MG TABS (NEBIVOLOL HCL) One by mouth once daily for high blood pressure  #70 x 0   Entered and Authorized by:   Etta Grandchild MD   Signed by:   Etta Grandchild MD on 07/16/2010   Method used:   Samples Given   RxID:   [id number]    Orders Added: 1)  Est. Patient Level IV [96295]    Prevention & Chronic Care Immunizations   Influenza vaccine: Fluvax 3+  (07/14/2010)    Tetanus booster: Not documented    Pneumococcal vaccine: Pneumovax  (07/14/2010)    H. zoster vaccine: Not documented   H. zoster vaccine deferral: Refused  (07/16/2010)  Colorectal Screening   Hemoccult: Not documented    Colonoscopy: DONE  (11/05/2009)   Colonoscopy due: 11/2014  Other Screening   PSA: 1.90  (05/17/2009)   Smoking status: current  (07/16/2010)   Smoking cessation counseling: yes  (07/16/2010)  Lipids   Total Cholesterol: 155  (05/17/2009)   LDL: 98  (05/17/2009)   LDL Direct: Not documented   HDL: 29.60  (05/17/2009)   Triglycerides: 135.0  (05/17/2009)  Hypertension   Last Blood Pressure: 122 / 64  (07/16/2010)   Serum creatinine: 1.1  (  07/11/2010)   Serum potassium 4.1  (07/11/2010)  Self-Management Support :    Hypertension self-management support: Not documented   Nursing Instructions: Give tetanus booster today

## 2010-10-07 NOTE — Progress Notes (Signed)
Summary: cx fee?  Phone Note Call from Patient Call back at Home Phone [phone]   Caller: wife, [name] Call For: Dr. Arlyce Dice Summary of Call: pt's wife called to cancel and reschedule COL sch'ed for tomorrow... [place] due to [place]... resch'ed for 11/05/2009...  Dr. Arlyce Dice, do you wish to charge this pt? Initial call taken by: Vallarie Mare,  October 10, 2009 1:34 PM  Follow-up for Phone Call        no Follow-up by: Louis Meckel MD,  October 11, 2009 9:03 AM

## 2010-10-09 NOTE — Letter (Signed)
Summary: Lipid Letter  [facility]  [address] [place], [place] [address]   Phone: [phone]  Fax: [phone]    08/22/2010  Kevin Bradley [address] [place], [place]  [address]  Dear Kevin Bradley:  We have carefully reviewed your last lipid profile from 08/22/2010 and the results are noted below with a summary of recommendations for lipid management.    Cholesterol:       146     Goal: <200   HDL "good" Cholesterol:   13.24     Goal: >40   LDL "bad" Cholesterol:   93     Goal: <130   Triglycerides:       120.0     Goal: <150        TLC Diet (Therapeutic Lifestyle Change): Saturated Fats & Transfatty acids should be kept < 7% of total calories ***Reduce Saturated Fats Polyunstaurated Fat can be up to 10% of total calories Monounsaturated Fat Fat can be up to 20% of total calories Total Fat should be no greater than 25-35% of total calories Carbohydrates should be 50-60% of total calories Protein should be approximately 15% of total calories Fiber should be at least 20-30 grams a day ***Increased fiber may help lower LDL Total Cholesterol should be < 200mg /day Consider adding plant stanol/sterols to diet (example: Benacol spread) ***A higher intake of unsaturated fat may reduce Triglycerides and Increase HDL    Adjunctive Measures (may lower LIPIDS and reduce risk of Heart Attack) include: Aerobic Exercise (20-30 minutes 3-4 times a week) Limit Alcohol Consumption Weight Reduction Aspirin 75-81 mg a day by mouth (if not allergic or contraindicated) Dietary Fiber 20-30 grams a day by mouth     Current Medications: 1)    Tiazac 180 Mg Xr24h-cap (Diltiazem hcl er beads) .... Take 1 tablet by mouth once a day 2)    Synthroid 50 Mcg Tabs (Levothyroxine sodium) .... Take 1 tablet by mouth once a day 3)    Fish Oil   Oil (Fish oil) .Marland Kitchen.. 1  tab by mouth once daily 4)    Vitamin D  .... 2 qd 5)    Aspirin Ec 325 Mg Tbec (Aspirin) .... Take one tablet by mouth  daily 6)    Tambocor 50 Mg Tabs (Flecainide acetate) .... 2 tab by mouth once daily 7)    Diovan 160 Mg Tabs (Valsartan) .... One by mouth once daily for high blood pressure 8)    Bystolic 5 Mg Tabs (Nebivolol hcl) .... One by mouth once daily for high blood pressure 9)    Diazepam 5 Mg Tabs (Diazepam) .... 1/2 -1 by mouth three times a day as needed for fear of flying  If you have any questions, please call. We appreciate being able to work with you.   Sincerely,    [facility] Etta Grandchild MD

## 2010-10-20 ENCOUNTER — Encounter: Payer: Self-pay | Admitting: Cardiology

## 2010-10-20 ENCOUNTER — Ambulatory Visit (INDEPENDENT_AMBULATORY_CARE_PROVIDER_SITE_OTHER): Payer: BC Managed Care – PPO | Admitting: Cardiology

## 2010-10-20 DIAGNOSIS — I4891 Unspecified atrial fibrillation: Secondary | ICD-10-CM

## 2010-10-20 DIAGNOSIS — I10 Essential (primary) hypertension: Secondary | ICD-10-CM

## 2010-10-27 ENCOUNTER — Other Ambulatory Visit (INDEPENDENT_AMBULATORY_CARE_PROVIDER_SITE_OTHER): Payer: BC Managed Care – PPO

## 2010-10-27 ENCOUNTER — Other Ambulatory Visit: Payer: Self-pay | Admitting: Cardiology

## 2010-10-27 ENCOUNTER — Encounter: Payer: Self-pay | Admitting: Cardiology

## 2010-10-27 ENCOUNTER — Telehealth: Payer: Self-pay | Admitting: Cardiology

## 2010-10-27 DIAGNOSIS — I10 Essential (primary) hypertension: Secondary | ICD-10-CM | POA: Insufficient documentation

## 2010-10-27 DIAGNOSIS — R0989 Other specified symptoms and signs involving the circulatory and respiratory systems: Secondary | ICD-10-CM

## 2010-10-27 LAB — BASIC METABOLIC PANEL
CO2: 29 mEq/L (ref 19–32)
Chloride: 104 mEq/L (ref 96–112)
Glucose, Bld: 96 mg/dL (ref 70–99)
Potassium: 3.8 mEq/L (ref 3.5–5.1)
Sodium: 142 mEq/L (ref 135–145)

## 2010-10-29 NOTE — Assessment & Plan Note (Signed)
Summary: f7m/dm-per pt call-mj/d.miller   Primary Provider:  Etta Grandchild MD  CC:  check up.  History of Present Illness: Pleasant gentleman with PMH atrial fibrillation for f/iu. Note TSH in March of 2011 was normal at 1.56. A CardioNet monitor in February revealed sinus rhythm with occasional bursts of atrial fibrillation.  The patient states he has had paroxysmal atrial fibrillation for 40 years. He had an episode of chest pain approximately 9 years ago and had a cardiac catheterization that was normal by his report. He previously lived in [place]. He had an exercise stress test approximately one year ago that was normal. An echocardiogram in May of 2011 revealed normal LV function, trivial aortic insufficiency and mild left atrial enlargement. Patient placed on flecanide previously. A followup stress echocardiogram showed no ischemia and normal LV function. There was a question of septal hypokinesis both at rest and with stress. There was no exercise induced arrhythmias. I last saw him in August of 2011. Since then he denies dyspnea on exertion, orthopnea, PND, pedal edema or syncope. He has had some problems with fatigue. He occasionally feels a brief skip but no sustained palpitations.  Current Medications (verified): 1)  Tiazac 180 Mg Xr24h-Cap (Diltiazem Hcl Er Beads) .... Take 1 Tablet By Mouth Once A Day 2)  Synthroid 50 Mcg Tabs (Levothyroxine Sodium) .... Take 1 Tablet By Mouth Once A Day 3)  Fish Oil   Oil (Fish Oil) .Marland Kitchen.. 1  Tab By Mouth Once Daily 4)  Vitamin D .... 2 Qd 5)  Aspirin Ec 325 Mg Tbec (Aspirin) .... Take One Tablet By Mouth Daily 6)  Tambocor 50 Mg Tabs (Flecainide Acetate) .... 2 Tab By Mouth Once Daily 7)  Diovan 160 Mg Tabs (Valsartan) .... One By Mouth Once Daily For High Blood Pressure 8)  Bystolic 5 Mg Tabs (Nebivolol Hcl) .... One By Mouth Once Daily For High Blood Pressure 9)  Diazepam 5 Mg Tabs (Diazepam) .... 1/2 -1 By Mouth Three Times A Day As Needed For  Fear of Flying  Allergies: 1)  ! Penicillin  Past History:  Past Medical History: Reviewed history from 12/31/2009 and no changes required. ATRIAL FIBRILLATION (ICD-427.31) HYPERTENSION (ICD-401.9) EXTERNAL THROMBOSED HEMORRHOIDS (ICD-455.4) COLONIC POLYPS, HX OF (ICD-V12.72) BENIGN PROSTATIC HYPERTROPHY (ICD-600.00) HYPOTHYROIDISM (ICD-244.9) ANXIETY (ICD-300.00) Low HDL Nephrolithiasis  Past Surgical History: Reviewed history from 12/31/2009 and no changes required. Inguinal herniorrhaphy Tonsillectomy Partial thyroidectomy Appendectomy  Social History: Reviewed history from 12/31/2009 and no changes required. Occupation: truck Hospital doctor Married Current Smoker - 2 cigars per day Alcohol use-none Drug use-no Regular exercise-yes  Review of Systems       no fevers or chills, productive cough, hemoptysis, dysphasia, odynophagia, melena, hematochezia, dysuria, hematuria, rash, seizure activity, orthopnea, PND, pedal edema, claudication. Remaining systems are negative.   Vital Signs:  Patient profile:   65 year old male Height:      72 inches Weight:      261 pounds BMI:     35.53 Pulse rate:   48 / minute Resp:     16 per minute BP sitting:   164 / 76  (left arm)  Vitals Entered By: Kem Parkinson (October 20, 2010 10:03 AM)  Physical Exam  General:  Well-developed well-nourished in no acute distress.  Skin is warm and dry.  HEENT is normal.  Neck is supple. No thyromegaly.  Chest is clear to auscultation with normal expansion.  Cardiovascular exam is regular rhythm; bradycardic Abdominal exam nontender or distended. No masses  palpated. Extremities show no edema. neuro grossly intact    EKG  Procedure date:  10/20/2010  Findings:      Marked sinus bradycardia with first degree AV block.  Impression & Recommendations:  Problem # 1:  TOBACCO USE (ICD-305.1) Patient counseled on discontinuing.  Problem # 2:  BRADYCARDIA (ICD-427.89) Decrease  bystolic to 2.5 mg by mouth daily for 2 days and then discontinue. Hopefully this will help with his fatigue. The following medications were removed from the medication list:    Bystolic 5 Mg Tabs (Nebivolol hcl) ..... One by mouth once daily for high blood pressure His updated medication list for this problem includes:    Tiazac 180 Mg Xr24h-cap (Diltiazem hcl er beads) .Marland Kitchen... Take 1 tablet by mouth once a day    Aspirin Ec 325 Mg Tbec (Aspirin) .Marland Kitchen... Take one tablet by mouth daily    Tambocor 50 Mg Tabs (Flecainide acetate) .Marland Kitchen... 2 tab by mouth once daily  Problem # 3:  ATRIAL FIBRILLATION (ICD-427.31) Pt remains in sinus rhythm. Continue flecanide, Cardizem and aspirin. Only one embolic risk factor which is hypertension. The following medications were removed from the medication list:    Bystolic 5 Mg Tabs (Nebivolol hcl) ..... One by mouth once daily for high blood pressure His updated medication list for this problem includes:    Aspirin Ec 325 Mg Tbec (Aspirin) .Marland Kitchen... Take one tablet by mouth daily    Tambocor 50 Mg Tabs (Flecainide acetate) .Marland Kitchen... 2 tab by mouth once daily  The following medications were removed from the medication list:    Bystolic 5 Mg Tabs (Nebivolol hcl) ..... One by mouth once daily for high blood pressure His updated medication list for this problem includes:    Aspirin Ec 325 Mg Tbec (Aspirin) .Marland Kitchen... Take one tablet by mouth daily    Tambocor 50 Mg Tabs (Flecainide acetate) .Marland Kitchen... 2 tab by mouth once daily  Problem # 4:  HYPERTENSION (ICD-401.9) Blood pressure elevated. We are also discontinuing beta blocker. Increase Diovan to 320 mg p.o. daily. Check potassium and renal function in one week. The following medications were removed from the medication list:    Bystolic 5 Mg Tabs (Nebivolol hcl) ..... One by mouth once daily for high blood pressure His updated medication list for this problem includes:    Tiazac 180 Mg Xr24h-cap (Diltiazem hcl er beads) .Marland Kitchen... Take  1 tablet by mouth once a day    Aspirin Ec 325 Mg Tbec (Aspirin) .Marland Kitchen... Take one tablet by mouth daily    Diovan 320 Mg Tabs (Valsartan) .Marland Kitchen... Take one tablet by mouth daily  The following medications were removed from the medication list:    Bystolic 5 Mg Tabs (Nebivolol hcl) ..... One by mouth once daily for high blood pressure His updated medication list for this problem includes:    Tiazac 180 Mg Xr24h-cap (Diltiazem hcl er beads) .Marland Kitchen... Take 1 tablet by mouth once a day    Aspirin Ec 325 Mg Tbec (Aspirin) .Marland Kitchen... Take one tablet by mouth daily    Diovan 320 Mg Tabs (Valsartan) .Marland Kitchen... Take one tablet by mouth daily  Problem # 5:  HYPOTHYROIDISM (ICD-244.9)  His updated medication list for this problem includes:    Synthroid 50 Mcg Tabs (Levothyroxine sodium) .Marland Kitchen... Take 1 tablet by mouth once a day  Patient Instructions: 1)  Your physician recommends that you return for lab work in:ONE WEEK 2)  Your physician wants you to follow-up in:6 MONTHS  You will receive a  reminder letter in the mail two months in advance. If you don't receive a letter, please call our office to schedule the follow-up appointment. 3)  Your physician has recommended you make the following change in your medication: TAKE BYSTOLIC 5MG  ONE HALF TABLET ONCE DAILY FOR 2 DAYS AND THEN STOP 4)  INCREASE DIOVAN 320MG  ONCE DAILY Prescriptions: DIOVAN 320 MG TABS (VALSARTAN) Take one tablet by mouth daily  #30 x 12   Entered by:   [name], RN   Authorized by:   Ferman Hamming, MD, [facility]   Signed by:   [name], RN on 10/20/2010   Method used:   Electronically to        [facility] [address] [phone]* (retail)       [address]       [place], [place]  [address]       Ph: [phone] or [phone]       Fax: [phone]   RxID:   [id number]

## 2010-11-04 NOTE — Progress Notes (Signed)
Summary: cost of blood pressure med is high- diovan  Phone Note Call from Patient Call back at Home Phone [phone]   Caller: Patient-cell phone [phone] Reason for Call: Talk to Nurse Summary of Call: pt here today for blood work. pt was seen last monday, pt take 3 blood pressure meds-  DIOVAN 320 MG is increase. the cost of this med is high. is their an gentric med he can take.  Initial call taken by: Lorne Skeens,  October 27, 2010 7:59 AM  Follow-up for Phone Call        Phone Call Completed PT ADVISED WILL FORWARD MESSAGE TO DR CRENSHAW FOR DIRECTION. PER PT DIOVAN WILL COST PT OUT OF POCKET $120.00 PER MONTH AFTER INSURANCE. Follow-up by: Scherrie Bateman, LPN,  October 27, 2010 8:40 AM  Additional Follow-up for Phone Call Additional follow up Details #1::        if no intolerance or allergy to acei, dc divan and begin lisinopril 40 daily and check bmet one week.  Ferman Hamming, MD, [facility]  October 27, 2010 4:55 PM  Left message to call back [name], RN  October 29, 2010 11:50 AM     Additional Follow-up for Phone Call Additional follow up Details #2::    pt aware of med change, he will finish the diovan he currently has and then will get the lisinopriol. he will call to schedule blood work after lisinopril started [name], RN  October 29, 2010 3:10 PM   New/Updated Medications: LISINOPRIL 40 MG TABS (LISINOPRIL) Take one tablet by mouth daily Prescriptions: LISINOPRIL 40 MG TABS (LISINOPRIL) Take one tablet by mouth daily  #30 x 12   Entered by:   [name], RN   Authorized by:   Ferman Hamming, MD, [facility]   Signed by:   [name], RN on 10/29/2010   Method used:   Electronically to        [facility] [address] [phone]* (retail)       [address]       [place], [place]  [address]       Ph: [phone] or [phone]       Fax: [phone]   RxID:   [id number]

## 2010-11-10 ENCOUNTER — Encounter: Payer: Self-pay | Admitting: Internal Medicine

## 2010-11-10 ENCOUNTER — Other Ambulatory Visit: Payer: BC Managed Care – PPO

## 2010-11-10 ENCOUNTER — Other Ambulatory Visit: Payer: Self-pay | Admitting: Internal Medicine

## 2010-11-10 ENCOUNTER — Ambulatory Visit (INDEPENDENT_AMBULATORY_CARE_PROVIDER_SITE_OTHER): Payer: BC Managed Care – PPO | Admitting: Internal Medicine

## 2010-11-10 DIAGNOSIS — I4891 Unspecified atrial fibrillation: Secondary | ICD-10-CM

## 2010-11-10 DIAGNOSIS — I10 Essential (primary) hypertension: Secondary | ICD-10-CM

## 2010-11-10 DIAGNOSIS — R7309 Other abnormal glucose: Secondary | ICD-10-CM

## 2010-11-10 DIAGNOSIS — Z79899 Other long term (current) drug therapy: Secondary | ICD-10-CM

## 2010-11-10 DIAGNOSIS — E039 Hypothyroidism, unspecified: Secondary | ICD-10-CM

## 2010-11-10 LAB — CBC WITH DIFFERENTIAL/PLATELET
Basophils Relative: 0.4 % (ref 0.0–3.0)
Eosinophils Relative: 1.9 % (ref 0.0–5.0)
Lymphocytes Relative: 19.1 % (ref 12.0–46.0)
MCV: 92.3 fl (ref 78.0–100.0)
Monocytes Absolute: 0.6 10*3/uL (ref 0.1–1.0)
Monocytes Relative: 7.2 % (ref 3.0–12.0)
Neutrophils Relative %: 71.4 % (ref 43.0–77.0)
Platelets: 203 10*3/uL (ref 150.0–400.0)
RBC: 5.78 Mil/uL (ref 4.22–5.81)
WBC: 8.1 10*3/uL (ref 4.5–10.5)

## 2010-11-10 LAB — BASIC METABOLIC PANEL
BUN: 18 mg/dL (ref 6–23)
Chloride: 102 mEq/L (ref 96–112)
Creatinine, Ser: 0.9 mg/dL (ref 0.4–1.5)
GFR: 89.88 mL/min (ref >60.00)

## 2010-11-10 LAB — HEPATIC FUNCTION PANEL
ALT: 15 U/L (ref 0–53)
AST: 18 U/L (ref 0–37)
Alkaline Phosphatase: 65 U/L (ref 39–117)
Bilirubin, Direct: 0.2 mg/dL (ref 0.0–0.3)
Total Bilirubin: 1 mg/dL (ref 0.3–1.2)
Total Protein: 6.8 g/dL (ref 6.0–8.3)

## 2010-11-10 LAB — HEMOGLOBIN A1C: Hgb A1c MFr Bld: 5.8 % (ref 4.6–6.5)

## 2010-11-18 NOTE — Assessment & Plan Note (Signed)
Summary: 3 MO FU Kevin Bradley Kevin Bradley   Vital Signs:  Patient profile:   65 year old male Height:      72 inches Weight:      203 pounds BMI:     27.63 O2 Sat:      97 % on Room air Temp:     98.1 degrees F oral Pulse rate:   50 / minute Pulse rhythm:   regular Resp:     16 per minute BP sitting:   146 / 80  (left arm) Cuff size:   large  Vitals Entered By: Rock Nephew CMA (November 10, 2010 8:15 AM)  Nutrition Counseling: Patient's BMI is greater than 25 and therefore counseled on weight management options.  O2 Flow:  Room air CC: follow-up visit, Hypertension Management Is Patient Diabetic? Yes Did you bring your meter with you today? No Pain Assessment Patient in pain? no        Primary Care Provider:  Etta Grandchild MD  CC:  follow-up visit and Hypertension Management.  History of Present Illness:  Follow-Up Visit      This is a 65 year old man who presents for Follow-up visit.  The patient denies chest pain, palpitations, dizziness, syncope, low blood sugar symptoms, high blood sugar symptoms, edema, SOB, DOE, PND, and orthopnea.  Since the last visit the patient notes no new problems or concerns.  The patient reports taking meds as prescribed, monitoring BP, monitoring blood sugars, and dietary compliance.  When questioned about possible medication side effects, the patient notes none.    Hypertension History:      He denies headache, chest pain, palpitations, dyspnea with exertion, orthopnea, PND, peripheral edema, visual symptoms, neurologic problems, syncope, and side effects from treatment.        Positive major cardiovascular risk factors include male age 66 years old or older, hyperlipidemia, hypertension, and current tobacco user.  Negative major cardiovascular risk factors include no history of diabetes and negative family history for ischemic heart disease.        Further assessment for target organ damage reveals no history of ASHD, cardiac end-organ damage (CHF/LVH),  stroke/TIA, peripheral vascular disease, renal insufficiency, or hypertensive retinopathy.     Preventive Screening-Counseling & Management  Alcohol-Tobacco     Alcohol drinks/day: 0     Alcohol Counseling: not indicated; patient does not drink     Smoking Status: current     Smoking Cessation Counseling: yes     Smoke Cessation Stage: precontemplative     Packs/Day: 0.75     Cigars/week: 14     Tobacco Counseling: to quit use of tobacco products  Hep-HIV-STD-Contraception     Hepatitis Risk: no risk noted     HIV Risk: no risk noted     STD Risk: no risk noted      Sexual History:  currently monogamous.        Drug Use:  never.        Blood Transfusions:  no.    Clinical Review Panels:  Prevention   Last Colonoscopy:  DONE (11/05/2009)   Last PSA:  1.90 (05/17/2009)  Immunizations   Last Tetanus Booster:  Tdap (08/11/2010)   Last Flu Vaccine:  Fluvax 3+ (07/14/2010)   Last Pneumovax:  Pneumovax (07/14/2010)  Lipid Management   Cholesterol:  146 (08/22/2010)   LDL (bad choesterol):  93 (08/22/2010)   HDL (good cholesterol):  29.10 (08/22/2010)  Diabetes Management   HgBA1C:  6.1 (07/11/2010)   Creatinine:  0.9 (10/27/2010)   Last Flu Vaccine:  Fluvax 3+ (07/14/2010)   Last Pneumovax:  Pneumovax (07/14/2010)  CBC   WBC:  8.2 (07/11/2010)   RBC:  5.49 (07/11/2010)   Hgb:  17.6 (07/11/2010)   Hct:  50.5 (07/11/2010)   Platelets:  229.0 (07/11/2010)   MCV  91.9 (07/11/2010)   MCHC  34.9 (07/11/2010)   RDW  12.2 (07/11/2010)   PMN:  66.2 (07/11/2010)   Lymphs:  21.7 (07/11/2010)   Monos:  10.2 (07/11/2010)   Eosinophils:  1.6 (07/11/2010)   Basophil:  0.3 (07/11/2010)  Complete Metabolic Panel   Glucose:  96 (10/27/2010)   Sodium:  142 (10/27/2010)   Potassium:  3.8 (10/27/2010)   Chloride:  104 (10/27/2010)   CO2:  29 (10/27/2010)   BUN:  20 (10/27/2010)   Creatinine:  0.9 (10/27/2010)   Albumin:  3.9 (07/11/2010)   Total Protein:  6.9  (07/11/2010)   Calcium:  9.5 (10/27/2010)   Total Bili:  1.3 (07/11/2010)   Alk Phos:  67 (07/11/2010)   SGPT (ALT):  20 (07/11/2010)   SGOT (AST):  23 (07/11/2010)   Medications Prior to Update: 1)  Tiazac 180 Mg Xr24h-Cap (Diltiazem Hcl Er Beads) .... Take 1 Tablet By Mouth Once A Day 2)  Synthroid 50 Mcg Tabs (Levothyroxine Sodium) .... Take 1 Tablet By Mouth Once A Day 3)  Fish Oil   Oil (Fish Oil) .Marland Kitchen.. 1  Tab By Mouth Once Daily 4)  Vitamin D .... 2 Qd 5)  Aspirin Ec 325 Mg Tbec (Aspirin) .... Take One Tablet By Mouth Daily 6)  Tambocor 50 Mg Tabs (Flecainide Acetate) .... 2 Tab By Mouth Once Daily 7)  Lisinopril 40 Mg Tabs (Lisinopril) .... Take One Tablet By Mouth Daily 8)  Diazepam 5 Mg Tabs (Diazepam) .... 1/2 -1 By Mouth Three Times A Day As Needed For Fear of Flying  Current Medications (verified): 1)  Tiazac 180 Mg Xr24h-Cap (Diltiazem Hcl Er Beads) .... Take 1 Tablet By Mouth Once A Day 2)  Synthroid 50 Mcg Tabs (Levothyroxine Sodium) .... Take 1 Tablet By Mouth Once A Day 3)  Fish Oil   Oil (Fish Oil) .Marland Kitchen.. 1  Tab By Mouth Once Daily 4)  Vitamin D .... 2 Qd 5)  Aspirin Ec 325 Mg Tbec (Aspirin) .... Take One Tablet By Mouth Daily 6)  Tambocor 50 Mg Tabs (Flecainide Acetate) .... 2 Tab By Mouth Once Daily 7)  Lisinopril 40 Mg Tabs (Lisinopril) .... Take One Tablet By Mouth Daily 8)  Diazepam 5 Mg Tabs (Diazepam) .... 1/2 -1 By Mouth Three Times A Day As Needed For Fear of Flying  Allergies (verified): 1)  ! Penicillin  Past History:  Past Medical History: Last updated: 12/31/2009 ATRIAL FIBRILLATION (ICD-427.31) HYPERTENSION (ICD-401.9) EXTERNAL THROMBOSED HEMORRHOIDS (ICD-455.4) COLONIC POLYPS, HX OF (ICD-V12.72) BENIGN PROSTATIC HYPERTROPHY (ICD-600.00) HYPOTHYROIDISM (ICD-244.9) ANXIETY (ICD-300.00) Low HDL Nephrolithiasis  Past Surgical History: Last updated: 12/31/2009 Inguinal herniorrhaphy Tonsillectomy Partial thyroidectomy Appendectomy  Family  History: Last updated: 12/31/2009 Family History of Alcoholism/Addiction Family History Breast cancer 1st degree relative <50 Father died of AAA; MI at age 56  Social History: Last updated: 12/31/2009 Occupation: truck driver Married Current Smoker - 2 cigars per day Alcohol use-none Drug use-no Regular exercise-yes  Risk Factors: Alcohol Use: 0 (11/10/2010) Exercise: yes (05/17/2009)  Risk Factors: Smoking Status: current (11/10/2010) Packs/Day: 0.75 (11/10/2010) Cigars/wk: 14 (11/10/2010)  Family History: Reviewed history from 12/31/2009 and no changes required. Family History of Alcoholism/Addiction Family History Breast  cancer 1st degree relative <50 Father died of AAA; MI at age 19  Social History: Reviewed history from 12/31/2009 and no changes required. Occupation: truck Hospital doctor Married Current Smoker - 2 cigars per day Alcohol use-none Drug use-no Regular exercise-yes  Review of Systems  The patient denies anorexia, fever, weight loss, weight gain, chest pain, syncope, dyspnea on exertion, peripheral edema, prolonged cough, headaches, hemoptysis, abdominal pain, hematuria, suspicious skin lesions, difficulty walking, and depression.   Endo:  Denies cold intolerance, excessive hunger, excessive thirst, excessive urination, heat intolerance, polyuria, and weight change.  Physical Exam  General:  alert, well-developed, well-nourished, well-hydrated, appropriate dress, normal appearance, healthy-appearing, cooperative to examination, and good hygiene.   Head:  normocephalic, atraumatic, no abnormalities observed, and no abnormalities palpated.   Mouth:  Oral mucosa and oropharynx without lesions or exudates.  Teeth in good repair. Neck:  Over his right lateral, lower neck there is a discreet sub-q mass that measures about 3 cm that feels like a lymph node. It feels fixed but is non-tender, non-fluctuant, and non-indurated. the overlying skin is normal. Lungs:   normal respiratory effort, no intercostal retractions, no accessory muscle use, normal breath sounds, no dullness, no fremitus, no crackles, and no wheezes.   Heart:  normal rate, regular rhythm, no murmur, no gallop, no rub, and no JVD.   Abdomen:  soft, non-tender, normal bowel sounds, no distention, no masses, no guarding, no rigidity, no rebound tenderness, no abdominal hernia, no inguinal hernia, no hepatomegaly, and no splenomegaly.   Msk:  normal ROM, no joint tenderness, no joint swelling, no joint warmth, no redness over joints, and no joint deformities.   Pulses:  R and L carotid,radial,femoral,dorsalis pedis and posterior tibial pulses are full and equal bilaterally Extremities:  No clubbing, cyanosis, edema, or deformity noted with normal full range of motion of all joints.   Neurologic:  alert & oriented X3, cranial nerves II-XII intact, strength normal in all extremities, sensation intact to light touch, sensation intact to pinprick, gait normal, DTRs symmetrical and normal, and finger-to-nose normal.   Skin:  turgor normal, no rashes, no suspicious lesions, no ecchymoses, no petechiae, no purpura, no ulcerations, no edema, excessive tan, solar damage, and seborrheic keratosis.   Psych:  Oriented X3, memory intact for recent and remote, normally interactive, good eye contact, not anxious appearing, not depressed appearing, not agitated, and not suicidal.     Impression & Recommendations:  Problem # 1:  ESSENTIAL HYPERTENSION, BENIGN (ICD-401.1) Assessment Unchanged  His updated medication list for this problem includes:    Tiazac 180 Mg Xr24h-cap (Diltiazem hcl er beads) .Marland Kitchen... Take 1 tablet by mouth once a day    Lisinopril 40 Mg Tabs (Lisinopril) .Marland Kitchen... Take one tablet by mouth daily  Orders: Venipuncture (29528) TLB-BMP (Basic Metabolic Panel-BMET) (80048-METABOL) TLB-CBC Platelet - w/Differential (85025-CBCD) TLB-Hepatic/Liver Function Pnl (80076-HEPATIC) TLB-TSH (Thyroid  Stimulating Hormone) (84443-TSH) TLB-A1C / Hgb A1C (Glycohemoglobin) (83036-A1C)  BP today: 146/80 Prior BP: 164/76 (10/20/2010)  10 Yr Risk Heart Disease: 27 % Prior 10 Yr Risk Heart Disease: 18 % (02/06/2010)  Labs Reviewed: K+: 3.8 (10/27/2010) Creat: : 0.9 (10/27/2010)   Chol: 146 (08/22/2010)   HDL: 29.10 (08/22/2010)   LDL: 93 (08/22/2010)   TG: 120.0 (08/22/2010)  Problem # 2:  HYPERGLYCEMIA (ICD-790.29) Assessment: Improved  Orders: Venipuncture (41324) TLB-BMP (Basic Metabolic Panel-BMET) (80048-METABOL) TLB-CBC Platelet - w/Differential (85025-CBCD) TLB-Hepatic/Liver Function Pnl (80076-HEPATIC) TLB-TSH (Thyroid Stimulating Hormone) (84443-TSH) TLB-A1C / Hgb A1C (Glycohemoglobin) (83036-A1C)  Labs Reviewed: Creat: 0.9 (10/27/2010)  Problem # 3:  HYPOTHYROIDISM (ICD-244.9) Assessment: Unchanged  His updated medication list for this problem includes:    Synthroid 50 Mcg Tabs (Levothyroxine sodium) .Marland Kitchen... Take 1 tablet by mouth once a day  Orders: Venipuncture (04540) TLB-BMP (Basic Metabolic Panel-BMET) (80048-METABOL) TLB-CBC Platelet - w/Differential (85025-CBCD) TLB-Hepatic/Liver Function Pnl (80076-HEPATIC) TLB-TSH (Thyroid Stimulating Hormone) (84443-TSH) TLB-A1C / Hgb A1C (Glycohemoglobin) (83036-A1C)  Labs Reviewed: TSH: 1.81 (07/11/2010)    HgBA1c: 6.1 (07/11/2010) Chol: 146 (08/22/2010)   HDL: 29.10 (08/22/2010)   LDL: 93 (08/22/2010)   TG: 120.0 (08/22/2010)  Complete Medication List: 1)  Tiazac 180 Mg Xr24h-cap (Diltiazem hcl er beads) .... Take 1 tablet by mouth once a day 2)  Synthroid 50 Mcg Tabs (Levothyroxine sodium) .... Take 1 tablet by mouth once a day 3)  Fish Oil Oil (Fish oil) .Marland Kitchen.. 1  tab by mouth once daily 4)  Vitamin D  .... 2 qd 5)  Aspirin Ec 325 Mg Tbec (Aspirin) .... Take one tablet by mouth daily 6)  Tambocor 50 Mg Tabs (Flecainide acetate) .... 2 tab by mouth once daily 7)  Lisinopril 40 Mg Tabs (Lisinopril) .... Take one  tablet by mouth daily 8)  Diazepam 5 Mg Tabs (Diazepam) .... 1/2 -1 by mouth three times a day as needed for fear of flying  Hypertension Assessment/Plan:      The patient's hypertensive risk group is category B: At least one risk factor (excluding diabetes) with no target organ damage.  His calculated 10 year risk of coronary heart disease is 27 %.  Today's blood pressure is 146/80.  His blood pressure goal is < 140/90.  Patient Instructions: 1)  Please schedule a follow-up appointment in 4 months. 2)  It is important that you exercise regularly at least 20 minutes 5 times a week. If you develop chest pain, have severe difficulty breathing, or feel very tired , stop exercising immediately and seek medical attention. 3)  Check your blood sugars regularly. If your readings are usually above 200 or below 70 you should contact our office. 4)  It is important that your Diabetic A1c level is checked every 3 months. 5)  See your eye doctor yearly to check for diabetic eye damage. 6)  Check your feet each night for sore areas, calluses or signs of infection. 7)  Check your Blood Pressure regularly. If it is above 130/80: you should make an appointment.   Orders Added: 1)  Venipuncture [36415] 2)  TLB-BMP (Basic Metabolic Panel-BMET) [80048-METABOL] 3)  TLB-CBC Platelet - w/Differential [85025-CBCD] 4)  TLB-Hepatic/Liver Function Pnl [80076-HEPATIC] 5)  TLB-TSH (Thyroid Stimulating Hormone) [84443-TSH] 6)  TLB-A1C / Hgb A1C (Glycohemoglobin) [83036-A1C] 7)  Est. Patient Level III [98119]

## 2010-11-19 ENCOUNTER — Telehealth: Payer: Self-pay | Admitting: Internal Medicine

## 2010-11-24 ENCOUNTER — Encounter: Payer: Self-pay | Admitting: Internal Medicine

## 2010-11-24 ENCOUNTER — Ambulatory Visit (INDEPENDENT_AMBULATORY_CARE_PROVIDER_SITE_OTHER): Payer: BC Managed Care – PPO | Admitting: Internal Medicine

## 2010-11-24 DIAGNOSIS — S8010XA Contusion of unspecified lower leg, initial encounter: Secondary | ICD-10-CM | POA: Insufficient documentation

## 2010-11-24 DIAGNOSIS — I10 Essential (primary) hypertension: Secondary | ICD-10-CM

## 2010-11-25 NOTE — Progress Notes (Signed)
Summary: lab results  Phone Note Call from Patient Call back at Home Phone [phone]   Caller: Pt's wife, [name] [phone] Reason for Call: Lab or Test Results Summary of Call: Pt's wife, [name] (on HIPAA) called about getting results of labwork. Please advise. Initial call taken by: Burnard Leigh [facility]),  November 19, 2010 4:11 PM  Follow-up for Phone Call        labs look good Follow-up by: Etta Grandchild MD,  November 19, 2010 4:30 PM  Additional Follow-up for Phone Call Additional follow up Details #1::        Informed Pt of results. Additional Follow-up by: Burnard Leigh [facility]),  November 19, 2010 6:18 PM

## 2010-11-27 ENCOUNTER — Other Ambulatory Visit: Payer: Self-pay | Admitting: Internal Medicine

## 2010-11-27 DIAGNOSIS — I10 Essential (primary) hypertension: Secondary | ICD-10-CM

## 2010-11-28 ENCOUNTER — Other Ambulatory Visit: Payer: Self-pay | Admitting: Internal Medicine

## 2010-11-28 NOTE — Telephone Encounter (Signed)
Taztia XT 180mg  Capsules Sig: Take one capsule by mouth daily  #30 Last Refilled: 10/27/10   Rx already completed on 11/27/10 by Dr Jones/escript

## 2010-12-04 NOTE — Assessment & Plan Note (Signed)
Summary: Left leg bump,knot//cd   Vital Signs:  Patient profile:   65 year old male Height:      72 inches Weight:      200 pounds O2 Sat:      98 % on Room air Temp:     98.2 degrees F oral Pulse rate:   64 / minute Pulse rhythm:   regular Resp:     16 per minute BP sitting:   136 / 86  (left arm)  O2 Flow:  Room air  Primary Care Provider:  Etta Grandchild MD   History of Present Illness: He returns for f/up and is concerned about an area on his left lower leg. He noticed it 5 days ago when he was in his truck. It started as a sore red spot at first but has now turned into a yellow/green area. He denies any inujry or trauma. The area is not painful today and he can bear weight on his leg without difficulty.  Preventive Screening-Counseling & Management  Alcohol-Tobacco     Alcohol drinks/day: 0     Alcohol Counseling: not indicated; patient does not drink     Smoking Status: current     Smoking Cessation Counseling: yes     Smoke Cessation Stage: precontemplative     Packs/Day: 0.75     Cigars/week: 14     Tobacco Counseling: to quit use of tobacco products  Hep-HIV-STD-Contraception     Hepatitis Risk: no risk noted     HIV Risk: no risk noted     STD Risk: no risk noted      Sexual History:  currently monogamous.        Drug Use:  never.        Blood Transfusions:  no.    Clinical Review Panels:  Prevention   Last Colonoscopy:  DONE (11/05/2009)   Last PSA:  1.90 (05/17/2009)  Immunizations   Last Tetanus Booster:  Tdap (08/11/2010)   Last Flu Vaccine:  Fluvax 3+ (07/14/2010)   Last Pneumovax:  Pneumovax (07/14/2010)  Lipid Management   Cholesterol:  146 (08/22/2010)   LDL (bad choesterol):  93 (08/22/2010)   HDL (good cholesterol):  29.10 (08/22/2010)  Diabetes Management   HgBA1C:  5.8 (11/10/2010)   Creatinine:  0.9 (11/10/2010)   Last Flu Vaccine:  Fluvax 3+ (07/14/2010)   Last Pneumovax:  Pneumovax (07/14/2010)  CBC   WBC:  8.1  (11/10/2010)   RBC:  5.78 (11/10/2010)   Hgb:  18.6 (11/10/2010)   Hct:  53.3 (11/10/2010)   Platelets:  203.0 (11/10/2010)   MCV  92.3 (11/10/2010)   MCHC  35.0 (11/10/2010)   RDW  12.5 (11/10/2010)   PMN:  71.4 (11/10/2010)   Lymphs:  19.1 (11/10/2010)   Monos:  7.2 (11/10/2010)   Eosinophils:  1.9 (11/10/2010)   Basophil:  0.4 (11/10/2010)  Complete Metabolic Panel   Glucose:  93 (11/10/2010)   Sodium:  137 (11/10/2010)   Potassium:  4.2 (11/10/2010)   Chloride:  102 (11/10/2010)   CO2:  27 (11/10/2010)   BUN:  18 (11/10/2010)   Creatinine:  0.9 (11/10/2010)   Albumin:  3.9 (11/10/2010)   Total Protein:  6.8 (11/10/2010)   Calcium:  8.9 (11/10/2010)   Total Bili:  1.0 (11/10/2010)   Alk Phos:  65 (11/10/2010)   SGPT (ALT):  15 (11/10/2010)   SGOT (AST):  18 (11/10/2010)   Medications Prior to Update: 1)  Tiazac 180 Mg Xr24h-Cap (Diltiazem Hcl Er Beads) .Marland KitchenMarland KitchenMarland Kitchen  Take 1 Tablet By Mouth Once A Day 2)  Synthroid 50 Mcg Tabs (Levothyroxine Sodium) .... Take 1 Tablet By Mouth Once A Day 3)  Fish Oil   Oil (Fish Oil) .Marland Kitchen.. 1  Tab By Mouth Once Daily 4)  Vitamin D .... 2 Qd 5)  Aspirin Ec 325 Mg Tbec (Aspirin) .... Take One Tablet By Mouth Daily 6)  Tambocor 50 Mg Tabs (Flecainide Acetate) .... 2 Tab By Mouth Once Daily 7)  Lisinopril 40 Mg Tabs (Lisinopril) .... Take One Tablet By Mouth Daily 8)  Diazepam 5 Mg Tabs (Diazepam) .... 1/2 -1 By Mouth Three Times A Day As Needed For Fear of Flying  Current Medications (verified): 1)  Tiazac 180 Mg Xr24h-Cap (Diltiazem Hcl Er Beads) .... Take 1 Tablet By Mouth Once A Day 2)  Synthroid 50 Mcg Tabs (Levothyroxine Sodium) .... Take 1 Tablet By Mouth Once A Day 3)  Fish Oil   Oil (Fish Oil) .Marland Kitchen.. 1  Tab By Mouth Once Daily 4)  Vitamin D .... 2 Qd 5)  Aspirin Ec 325 Mg Tbec (Aspirin) .... Take One Tablet By Mouth Daily 6)  Tambocor 50 Mg Tabs (Flecainide Acetate) .... 2 Tab By Mouth Once Daily 7)  Lisinopril 40 Mg Tabs (Lisinopril) ....  Take One Tablet By Mouth Daily 8)  Diazepam 5 Mg Tabs (Diazepam) .... 1/2 -1 By Mouth Three Times A Day As Needed For Fear of Flying  Allergies (verified): 1)  ! Penicillin  Past History:  Past Medical History: Last updated: 12/31/2009 ATRIAL FIBRILLATION (ICD-427.31) HYPERTENSION (ICD-401.9) EXTERNAL THROMBOSED HEMORRHOIDS (ICD-455.4) COLONIC POLYPS, HX OF (ICD-V12.72) BENIGN PROSTATIC HYPERTROPHY (ICD-600.00) HYPOTHYROIDISM (ICD-244.9) ANXIETY (ICD-300.00) Low HDL Nephrolithiasis  Past Surgical History: Last updated: 12/31/2009 Inguinal herniorrhaphy Tonsillectomy Partial thyroidectomy Appendectomy  Family History: Last updated: 12/31/2009 Family History of Alcoholism/Addiction Family History Breast cancer 1st degree relative <50 Father died of AAA; MI at age 89  Social History: Last updated: 12/31/2009 Occupation: truck driver Married Current Smoker - 2 cigars per day Alcohol use-none Drug use-no Regular exercise-yes  Risk Factors: Alcohol Use: 0 (11/24/2010) Exercise: yes (05/17/2009)  Risk Factors: Smoking Status: current (11/24/2010) Packs/Day: 0.75 (11/24/2010) Cigars/wk: 14 (11/24/2010)  Family History: Reviewed history from 12/31/2009 and no changes required. Family History of Alcoholism/Addiction Family History Breast cancer 1st degree relative <50 Father died of AAA; MI at age 85  Social History: Reviewed history from 12/31/2009 and no changes required. Occupation: truck Hospital doctor Married Current Smoker - 2 cigars per day Alcohol use-none Drug use-no Regular exercise-yes  Review of Systems  The patient denies anorexia, fever, chest pain, dyspnea on exertion, peripheral edema, prolonged cough, headaches, hemoptysis, abdominal pain, muscle weakness, suspicious skin lesions, and enlarged lymph nodes.   MS:  Denies joint pain, joint redness, joint swelling, loss of strength, mid back pain, muscle aches, cramps, and stiffness.  Physical  Exam  General:  alert, well-developed, well-nourished, well-hydrated, appropriate dress, normal appearance, healthy-appearing, cooperative to examination, and good hygiene.   Mouth:  Oral mucosa and oropharynx without lesions or exudates.  Teeth in good repair. Neck:  Over his right lateral, lower neck there is a discreet sub-q mass that measures about 3 cm that feels like a lymph node. It feels fixed but is non-tender, non-fluctuant, and non-indurated. the overlying skin is normal. Lungs:  normal respiratory effort, no intercostal retractions, no accessory muscle use, normal breath sounds, no dullness, no fremitus, no crackles, and no wheezes.   Heart:  normal rate, regular rhythm, no murmur, no gallop,  no rub, and no JVD.   Abdomen:  soft, non-tender, normal bowel sounds, no distention, no masses, no guarding, no rigidity, no rebound tenderness, no abdominal hernia, no inguinal hernia, no hepatomegaly, and no splenomegaly.   Msk:  over the left lower leg, medial side and above the medial malleolus there is a very subtle area of yellow/green swelling with no ttp, erythema, warmth, induration, fluctuance, streaking, or disruption of the skin. Pulses:  R and L carotid,radial,femoral,dorsalis pedis and posterior tibial pulses are full and equal bilaterally Neurologic:  No cranial nerve deficits noted. Station and gait are normal. Plantar reflexes are down-going bilaterally. DTRs are symmetrical throughout. Sensory, motor and coordinative functions appear intact. Skin:  turgor normal, color normal, no rashes, no suspicious lesions, no ecchymoses, no petechiae, no purpura, no ulcerations, and no edema.   Psych:  Cognition and judgment appear intact. Alert and cooperative with normal attention span and concentration. No apparent delusions, illusions, hallucinations   Impression & Recommendations:  Problem # 1:  CONTUSION OF LOWER LEG (ICD-924.10) Assessment New there is no evidence of bone injury,  infection, or inflammation. It looks like he may have had an injury that he does not remember or a bug bite but there is no need for any medical intervention at this time  Problem # 2:  ESSENTIAL HYPERTENSION, BENIGN (ICD-401.1) Assessment: Improved  His updated medication list for this problem includes:    Tiazac 180 Mg Xr24h-cap (Diltiazem hcl er beads) .Marland Kitchen... Take 1 tablet by mouth once a day    Lisinopril 40 Mg Tabs (Lisinopril) .Marland Kitchen... Take one tablet by mouth daily  BP today: 136/86 Prior BP: 146/80 (11/10/2010)  Prior 10 Yr Risk Heart Disease: 27 % (11/10/2010)  Labs Reviewed: K+: 4.2 (11/10/2010) Creat: : 0.9 (11/10/2010)   Chol: 146 (08/22/2010)   HDL: 29.10 (08/22/2010)   LDL: 93 (08/22/2010)   TG: 120.0 (08/22/2010)  Complete Medication List: 1)  Tiazac 180 Mg Xr24h-cap (Diltiazem hcl er beads) .... Take 1 tablet by mouth once a day 2)  Synthroid 50 Mcg Tabs (Levothyroxine sodium) .... Take 1 tablet by mouth once a day 3)  Fish Oil Oil (Fish oil) .Marland Kitchen.. 1  tab by mouth once daily 4)  Vitamin D  .... 2 qd 5)  Aspirin Ec 325 Mg Tbec (Aspirin) .... Take one tablet by mouth daily 6)  Tambocor 50 Mg Tabs (Flecainide acetate) .... 2 tab by mouth once daily 7)  Lisinopril 40 Mg Tabs (Lisinopril) .... Take one tablet by mouth daily 8)  Diazepam 5 Mg Tabs (Diazepam) .... 1/2 -1 by mouth three times a day as needed for fear of flying  Patient Instructions: 1)  Please schedule a follow-up appointment in 2 weeks. 2)  Tobacco is very bad for your health and your loved ones! You Should stop smoking!. 3)  Stop Smoking Tips: Choose a Quit date. Cut down before the Quit date. decide what you will do as a substitute when you feel the urge to smoke(gum,toothpick,exercise). 4)  It is important that you exercise regularly at least 20 minutes 5 times a week. If you develop chest pain, have severe difficulty breathing, or feel very tired , stop exercising immediately and seek medical attention. 5)   You need to lose weight. Consider a lower calorie diet and regular exercise.  6)  Check your Blood Pressure regularly. If it is above 130/80: you should make an appointment.   Orders Added: 1)  Est. Patient Level III [16109]

## 2010-12-18 ENCOUNTER — Other Ambulatory Visit: Payer: Self-pay | Admitting: Internal Medicine

## 2011-01-21 ENCOUNTER — Inpatient Hospital Stay (INDEPENDENT_AMBULATORY_CARE_PROVIDER_SITE_OTHER)
Admission: RE | Admit: 2011-01-21 | Discharge: 2011-01-21 | Disposition: A | Discharge status: Home / Self Care | Payer: BC Managed Care – PPO | Source: Ambulatory Visit | Attending: Family Medicine | Admitting: Family Medicine

## 2011-01-21 DIAGNOSIS — L259 Unspecified contact dermatitis, unspecified cause: Secondary | ICD-10-CM

## 2011-03-04 ENCOUNTER — Encounter: Payer: Self-pay | Admitting: Cardiology

## 2011-03-18 ENCOUNTER — Other Ambulatory Visit: Payer: Self-pay | Admitting: Internal Medicine

## 2011-04-20 ENCOUNTER — Ambulatory Visit (INDEPENDENT_AMBULATORY_CARE_PROVIDER_SITE_OTHER): Payer: BC Managed Care – PPO | Admitting: Cardiology

## 2011-04-20 ENCOUNTER — Encounter: Payer: Self-pay | Admitting: Cardiology

## 2011-04-20 DIAGNOSIS — I10 Essential (primary) hypertension: Secondary | ICD-10-CM

## 2011-04-20 DIAGNOSIS — I4891 Unspecified atrial fibrillation: Secondary | ICD-10-CM

## 2011-04-20 MED ORDER — LOSARTAN POTASSIUM 50 MG PO TABS: 50.0000 mg | ORAL_TABLET | Freq: Every day | ORAL | Status: DC

## 2011-04-20 NOTE — Assessment & Plan Note (Signed)
Patient remained in sinus rhythm. Continue Cardizem and flecanide. Continue aspirin. His only embolic risk factor is hypertension. He is having palpitations. This may be recurrent atrial fibrillation but I am also concerned about ventricular tachycardia related to his flecanide as there is a possible exertional component. Scheduled CardioNet. If vt demonstrated would need to discontinue flecanide. If atrial fibrillation, may need to increase the dose.

## 2011-04-20 NOTE — Assessment & Plan Note (Signed)
Blood pressure elevated. He discontinued his lisinopril because of cough. Add Cozaar 50 mg daily. Check potassium and renal function in one week.

## 2011-04-20 NOTE — Patient Instructions (Signed)
Your physician has recommended you make the following change in your medication:  1) Start Cozaar (losartan) 50mg  one tablet by mouth once daily.  Your physician recommends that you return for lab work in: 1 week- bmp (427.31;401.1).  Your physician has recommended that you wear an event monitor (21-day Cardionet). Event monitors are medical devices that record the heart's electrical activity. Doctors most often [place] these monitors to diagnose arrhythmias. Arrhythmias are problems with the speed or rhythm of the heartbeat. The monitor is a small, portable device. You can wear one while you do your normal daily activities. This is usually used to diagnose what is causing palpitations/syncope (passing out).  Your physician recommends that you schedule a follow-up appointment in: 6-8 weeks.

## 2011-04-20 NOTE — Progress Notes (Signed)
BJY:NWGNFAOZ gentleman with PMH atrial fibrillation for f/iu. Note TSH in March of 2011 was normal at 1.56. A CardioNet monitor in February revealed sinus rhythm with occasional bursts of atrial fibrillation.  The patient states he has had paroxysmal atrial fibrillation for 40 years. He had an episode of chest pain approximately 9 years ago and had a cardiac catheterization that was normal by his report. He previously lived in [place]. He had an exercise stress test approximately one year ago that was normal. An echocardiogram in May of 2011 revealed normal LV function, trivial aortic insufficiency and mild left atrial enlargement. Patient placed on flecanide previously. A followup stress echocardiogram showed no ischemia and normal LV function. There was a question of septal hypokinesis both at rest and with stress. There was no exercise induced arrhythmias. I last saw him in Feb 2012. Since then he denies dyspnea on exertion, orthopnea, PND, pedal edema or syncope. He discontinued his lisinopril because of fatigue and cough. He he is also having occasional palpitations that are worse compared to previous. No syncope.  Current Outpatient Prescriptions  Medication Sig Dispense Refill  . aspirin EC 325 MG tablet Take 325 mg by mouth daily.        . Fish Oil OIL Take 1 tablet by mouth daily.        . flecainide (TAMBOCOR) 50 MG tablet Take 100 mg by mouth daily.        Marland Kitchen levothyroxine (SYNTHROID, LEVOTHROID) 50 MCG tablet TAKE ONE TABLET BY MOUTH ONCE DAILY  90 tablet  0  . lisinopril (PRINIVIL,ZESTRIL) 40 MG tablet Take 40 mg by mouth daily.        Marland Kitchen TAZTIA XT 180 MG 24 hr capsule TAKE ONE CAPSULE BY MOUTH DAILY  30 capsule  5  . VITAMIN D, CHOLECALCIFEROL, PO Take 2 tablets by mouth daily.           Past Medical History  Diagnosis Date  . Atrial fibrillation   . HTN (hypertension)   . External thrombosed hemorrhoids   . Hx of colonic polyps   . BPH (benign prostatic hypertrophy)   .  Hypothyroidism   . Anxiety   . Low HDL (under 40)   . Nephrolithiasis   . History of colonoscopy     Past Surgical History  Procedure Date  . Inguinal hernia repair   . Tonsillectomy   . Thyroidectomy, partial   . Appendectomy     History   Social History  . Marital Status: Married    Spouse Name: N/A    Number of Children: N/A  . 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 Smoking status: Former Smoker    Types: Cigars    Quit date: 10/08/2010  . Smokeless tobacco: Not on file  . Alcohol Use: No  . Drug Use: No  . Sexually Active: Not on file   Other Topics Concern  . Not on file   Social History Narrative  . No narrative on file    ROS: no fevers or chills, productive cough, hemoptysis, dysphasia, odynophagia, melena, hematochezia, dysuria, hematuria, rash, seizure activity, orthopnea, PND, pedal edema, claudication. Remaining systems are negative.  Physical Exam: Well-developed well-nourished in no acute distress.  Skin is warm and dry.  HEENT is normal.  Neck is supple. No thyromegaly.  Chest is clear to auscultation with normal expansion.  Cardiovascular exam is regular rate and rhythm.  Abdominal exam  nontender or distended. No masses palpated. Extremities show no edema. neuro grossly intact  ECG sinus bradycardia with first degree block. RV conduction delay. No ST changes.

## 2011-04-30 ENCOUNTER — Telehealth: Payer: Self-pay

## 2011-05-01 ENCOUNTER — Encounter (INDEPENDENT_AMBULATORY_CARE_PROVIDER_SITE_OTHER): Payer: BC Managed Care – PPO

## 2011-05-01 ENCOUNTER — Other Ambulatory Visit (INDEPENDENT_AMBULATORY_CARE_PROVIDER_SITE_OTHER): Payer: BC Managed Care – PPO | Admitting: *Deleted

## 2011-05-01 DIAGNOSIS — I4891 Unspecified atrial fibrillation: Secondary | ICD-10-CM

## 2011-05-01 DIAGNOSIS — I10 Essential (primary) hypertension: Secondary | ICD-10-CM

## 2011-05-01 LAB — BASIC METABOLIC PANEL
CO2: 28 mEq/L (ref 19–32)
Calcium: 9.2 mg/dL (ref 8.4–10.5)
Chloride: 104 mEq/L (ref 96–112)
Potassium: 3.7 mEq/L (ref 3.5–5.1)
Sodium: 141 mEq/L (ref 135–145)

## 2011-05-01 NOTE — Telephone Encounter (Signed)
Patient will have monitor today when he come in for Labs

## 2011-05-22 ENCOUNTER — Other Ambulatory Visit: Payer: Self-pay | Admitting: Internal Medicine

## 2011-06-01 ENCOUNTER — Ambulatory Visit: Payer: BC Managed Care – PPO | Admitting: Cardiology

## 2011-06-04 ENCOUNTER — Telehealth: Payer: Self-pay | Admitting: *Deleted

## 2011-06-04 NOTE — Telephone Encounter (Signed)
Spoke with pt, pt aware monitor reviewed by dr Jens Som shows sinus with brief run of PAT  Kevin Bradley

## 2011-06-14 ENCOUNTER — Other Ambulatory Visit: Payer: Self-pay | Admitting: Internal Medicine

## 2011-06-22 ENCOUNTER — Telehealth: Payer: Self-pay | Admitting: Cardiology

## 2011-06-22 DIAGNOSIS — I4891 Unspecified atrial fibrillation: Secondary | ICD-10-CM

## 2011-06-22 DIAGNOSIS — I10 Essential (primary) hypertension: Secondary | ICD-10-CM

## 2011-06-22 MED ORDER — LOSARTAN POTASSIUM 100 MG PO TABS: 100.0000 mg | ORAL_TABLET | Freq: Every day | ORAL | Status: DC

## 2011-06-22 NOTE — Telephone Encounter (Signed)
Spoke with pt, aware of med changes. Labs will be checked in one week [name]

## 2011-06-22 NOTE — Telephone Encounter (Signed)
Pt called he said his BP has been 166/100 this weekend please call

## 2011-06-22 NOTE — Telephone Encounter (Signed)
Spoke with pt, over the weekend his bp ranged from 146-170/85-109. He takes his tiazac in the am and his losartan in the pm. This am his bp 160/100 and he went ahead and took his losartan and now his bp is 133/77. He denies elevated heart rate or other problems. Will forward for dr Jens Som review [name]

## 2011-06-22 NOTE — Telephone Encounter (Signed)
Change cozaar to 100 mg daily; bmet one week Brian Crenshaw  

## 2011-06-25 ENCOUNTER — Ambulatory Visit (INDEPENDENT_AMBULATORY_CARE_PROVIDER_SITE_OTHER): Payer: BC Managed Care – PPO | Admitting: Cardiology

## 2011-06-25 ENCOUNTER — Encounter: Payer: Self-pay | Admitting: Cardiology

## 2011-06-25 DIAGNOSIS — D3500 Benign neoplasm of unspecified adrenal gland: Secondary | ICD-10-CM

## 2011-06-25 DIAGNOSIS — I4891 Unspecified atrial fibrillation: Secondary | ICD-10-CM

## 2011-06-25 DIAGNOSIS — I10 Essential (primary) hypertension: Secondary | ICD-10-CM

## 2011-06-25 DIAGNOSIS — E278 Other specified disorders of adrenal gland: Secondary | ICD-10-CM | POA: Insufficient documentation

## 2011-06-25 NOTE — Progress Notes (Signed)
ZOX:WRUEAVWU gentleman with PMH atrial fibrillation for f/iu. Note TSH in March of 2011 was normal at 1.56. A CardioNet monitor in February revealed sinus rhythm with occasional bursts of atrial fibrillation. The patient states he has had paroxysmal atrial fibrillation for 40 years. He had an episode of chest pain approximately 9 years ago and had a cardiac catheterization that was normal by his report. He previously lived in [place]. He had an exercise stress test approximately one year ago that was normal. An echocardiogram in May of 2011 revealed normal LV function, trivial aortic insufficiency and mild left atrial enlargement. Patient placed on flecanide previously. A followup stress echocardiogram showed no ischemia and normal LV function. There was a question of septal hypokinesis both at rest and with stress. There was no exercise induced arrhythmias. I last saw him in August of 2012 and he was complaining of palpitations; cardionet showed sinus with PAT but no VT. Since I last saw him, there is no dyspnea or chest pain. He continues to have occasional palpitations but improved.   Current Outpatient Prescriptions  Medication Sig Dispense Refill  . aspirin EC 325 MG tablet Take 325 mg by mouth daily.        . Fish Oil OIL Take 1 tablet by mouth daily.        . flecainide (TAMBOCOR) 50 MG tablet Take 100 mg by mouth daily.       Marland Kitchen losartan (COZAAR) 100 MG tablet Take 1 tablet (100 mg total) by mouth daily.  90 tablet  3  . SYNTHROID 50 MCG tablet TAKE ONE TABLET BY MOUTH ONCE DAILY  90 tablet  0  . TAZTIA XT 180 MG 24 hr capsule TAKE ONE CAPSULE BY MOUTH DAILY  30 capsule  5  . VITAMIN D, CHOLECALCIFEROL, PO Take 2 tablets by mouth daily.           Past Medical History  Diagnosis Date  . Atrial fibrillation   . HTN (hypertension)   . External thrombosed hemorrhoids   . Hx of colonic polyps   . BPH (benign prostatic hypertrophy)   . Hypothyroidism   . Anxiety   . Low HDL (under 40)   .  Nephrolithiasis   . History of colonoscopy     Past Surgical History  Procedure Date  . Inguinal hernia repair   . Tonsillectomy   . Thyroidectomy, partial   . Appendectomy     History   Social History  . Marital Status: Married    Spouse Name: N/A    Number of Children: N/A  . 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 Smoking status: Former Smoker    Types: Cigars    Quit date: 10/08/2010  . Smokeless tobacco: Not on file  . Alcohol Use: No  . Drug Use: No  . Sexually Active: Not on file   Other Topics Concern  . Not on file   Social History Narrative  . No narrative on file    ROS: no fevers or chills, productive cough, hemoptysis, dysphasia, odynophagia, melena, hematochezia, dysuria, hematuria, rash, seizure activity, orthopnea, PND, pedal edema, claudication. Remaining systems are negative.  Physical Exam: Well-developed well-nourished in no acute distress.  Skin is warm and dry.  HEENT is normal.  Neck is supple. No thyromegaly.  Chest is clear to auscultation with normal expansion.  Cardiovascular exam is regular rate and rhythm.  Abdominal exam nontender or distended.  No masses palpated. Extremities show no edema. neuro grossly intact

## 2011-06-25 NOTE — Assessment & Plan Note (Signed)
Patient remains in sinus rhythm on examination. Continue flecanide and cardizem. Patient has embolic risk factors of hypertension and age greater than 5. I recommended discontinuing aspirin and beginning either xeralto or pradaxa. Patient would like to consider this before deciding. He understands increased risk of CVA with ASA.

## 2011-06-25 NOTE — Assessment & Plan Note (Signed)
Blood pressure mildly elevated but his Cozaar was recently increased. We will follow and add additional medications as needed. Most likely HCTZ.

## 2011-06-25 NOTE — Assessment & Plan Note (Signed)
He tells me today that was noted to have an adrenal nodule on abdominal CT in 2005 in [place]. We will repeat that to reassess.

## 2011-06-25 NOTE — Patient Instructions (Signed)
Your physician wants you to follow-up in: 6 months You will receive a reminder letter in the mail two months in advance. If you don't receive a letter, please call our office to schedule the follow-up appointment.   Your physician recommends that you return for lab work in: AT [address] THE MORNING OF CT  CT OF THE ABDOMEN WITH AND WITHOUT CONTRAST=HX OF ADRENAL NODULE

## 2011-06-29 ENCOUNTER — Other Ambulatory Visit: Payer: BC Managed Care – PPO | Admitting: *Deleted

## 2011-07-07 ENCOUNTER — Encounter: Payer: Self-pay | Admitting: *Deleted

## 2011-07-09 ENCOUNTER — Ambulatory Visit (INDEPENDENT_AMBULATORY_CARE_PROVIDER_SITE_OTHER)
Admission: RE | Admit: 2011-07-09 | Discharge: 2011-07-09 | Disposition: A | Discharge status: Home / Self Care | Payer: BC Managed Care – PPO | Source: Ambulatory Visit | Attending: Cardiology | Admitting: Cardiology

## 2011-07-09 ENCOUNTER — Other Ambulatory Visit (INDEPENDENT_AMBULATORY_CARE_PROVIDER_SITE_OTHER): Payer: BC Managed Care – PPO

## 2011-07-09 ENCOUNTER — Other Ambulatory Visit: Payer: Self-pay | Admitting: Cardiology

## 2011-07-09 DIAGNOSIS — E279 Disorder of adrenal gland, unspecified: Secondary | ICD-10-CM

## 2011-07-09 DIAGNOSIS — I10 Essential (primary) hypertension: Secondary | ICD-10-CM

## 2011-07-09 DIAGNOSIS — I4891 Unspecified atrial fibrillation: Secondary | ICD-10-CM

## 2011-07-09 DIAGNOSIS — D3500 Benign neoplasm of unspecified adrenal gland: Secondary | ICD-10-CM

## 2011-07-09 DIAGNOSIS — E278 Other specified disorders of adrenal gland: Secondary | ICD-10-CM

## 2011-07-09 LAB — BASIC METABOLIC PANEL
BUN: 16 mg/dL (ref 6–23)
CO2: 26 mEq/L (ref 19–32)
Chloride: 106 mEq/L (ref 96–112)
Potassium: 3.6 mEq/L (ref 3.5–5.1)

## 2011-07-10 ENCOUNTER — Telehealth: Payer: Self-pay | Admitting: Cardiology

## 2011-07-10 DIAGNOSIS — I10 Essential (primary) hypertension: Secondary | ICD-10-CM

## 2011-07-10 DIAGNOSIS — I4891 Unspecified atrial fibrillation: Secondary | ICD-10-CM

## 2011-07-10 MED ORDER — LOSARTAN POTASSIUM 100 MG PO TABS: 100.0000 mg | ORAL_TABLET | Freq: Every day | ORAL | Status: DC

## 2011-07-10 NOTE — Telephone Encounter (Signed)
Pt was to have losartin 100 mg called into [facility]

## 2011-09-04 ENCOUNTER — Other Ambulatory Visit: Payer: Self-pay

## 2011-09-12 ENCOUNTER — Other Ambulatory Visit: Payer: Self-pay | Admitting: Internal Medicine

## 2011-09-16 ENCOUNTER — Other Ambulatory Visit: Payer: Self-pay

## 2011-09-18 ENCOUNTER — Encounter: Payer: Self-pay | Admitting: Internal Medicine

## 2011-09-18 ENCOUNTER — Ambulatory Visit (INDEPENDENT_AMBULATORY_CARE_PROVIDER_SITE_OTHER): Payer: BC Managed Care – PPO | Admitting: Internal Medicine

## 2011-09-18 ENCOUNTER — Other Ambulatory Visit (INDEPENDENT_AMBULATORY_CARE_PROVIDER_SITE_OTHER): Payer: BC Managed Care – PPO

## 2011-09-18 DIAGNOSIS — E039 Hypothyroidism, unspecified: Secondary | ICD-10-CM

## 2011-09-18 DIAGNOSIS — F411 Generalized anxiety disorder: Secondary | ICD-10-CM

## 2011-09-18 DIAGNOSIS — Z0000 Encounter for general adult medical examination without abnormal findings: Secondary | ICD-10-CM

## 2011-09-18 DIAGNOSIS — N400 Enlarged prostate without lower urinary tract symptoms: Secondary | ICD-10-CM

## 2011-09-18 DIAGNOSIS — I4891 Unspecified atrial fibrillation: Secondary | ICD-10-CM

## 2011-09-18 DIAGNOSIS — F17200 Nicotine dependence, unspecified, uncomplicated: Secondary | ICD-10-CM

## 2011-09-18 DIAGNOSIS — R7309 Other abnormal glucose: Secondary | ICD-10-CM

## 2011-09-18 DIAGNOSIS — I10 Essential (primary) hypertension: Secondary | ICD-10-CM

## 2011-09-18 LAB — CBC WITH DIFFERENTIAL/PLATELET
Basophils Relative: 0.5 % (ref 0.0–3.0)
Eosinophils Relative: 3 % (ref 0.0–5.0)
HCT: 51.2 % (ref 39.0–52.0)
MCV: 91.7 fl (ref 78.0–100.0)
Monocytes Absolute: 0.6 10*3/uL (ref 0.1–1.0)
Monocytes Relative: 9 % (ref 3.0–12.0)
Neutrophils Relative %: 59.6 % (ref 43.0–77.0)
Platelets: 217 10*3/uL (ref 150.0–400.0)
RBC: 5.58 Mil/uL (ref 4.22–5.81)
WBC: 6.9 10*3/uL (ref 4.5–10.5)

## 2011-09-18 LAB — COMPREHENSIVE METABOLIC PANEL
ALT: 18 U/L (ref 0–53)
Albumin: 4 g/dL (ref 3.5–5.2)
CO2: 26 mEq/L (ref 19–32)
Calcium: 8.9 mg/dL (ref 8.4–10.5)
Chloride: 105 mEq/L (ref 96–112)
GFR: 83.21 mL/min (ref >60.00)
Glucose, Bld: 103 mg/dL — ABNORMAL HIGH (ref 70–99)
Sodium: 140 mEq/L (ref 135–145)
Total Bilirubin: 1.3 mg/dL — ABNORMAL HIGH (ref 0.3–1.2)
Total Protein: 6.8 g/dL (ref 6.0–8.3)

## 2011-09-18 LAB — URINALYSIS, ROUTINE W REFLEX MICROSCOPIC
Bilirubin Urine: NEGATIVE
Ketones, ur: NEGATIVE
Leukocytes, UA: NEGATIVE
Urine Glucose: NEGATIVE
Urobilinogen, UA: 0.2 (ref 0.0–1.0)

## 2011-09-18 LAB — LIPID PANEL
HDL: 31.8 mg/dL — ABNORMAL LOW (ref >39.00)
Total CHOL/HDL Ratio: 5

## 2011-09-18 LAB — TSH: TSH: 1.69 u[IU]/mL (ref 0.35–5.50)

## 2011-09-18 LAB — HEMOGLOBIN A1C: Hgb A1c MFr Bld: 5.7 % (ref 4.6–6.5)

## 2011-09-18 MED ORDER — DILTIAZEM HCL ER BEADS 180 MG PO CP24: 180.0000 mg | ORAL_CAPSULE | Freq: Every day | ORAL | Status: DC

## 2011-09-18 MED ORDER — LOSARTAN POTASSIUM 100 MG PO TABS: 100.0000 mg | ORAL_TABLET | Freq: Every day | ORAL | Status: DC

## 2011-09-18 MED ORDER — LEVOTHYROXINE SODIUM 50 MCG PO TABS: 50.0000 ug | ORAL_TABLET | Freq: Every day | ORAL | Status: DC

## 2011-09-18 MED ORDER — DIAZEPAM 5 MG PO TABS: 5.0000 mg | ORAL_TABLET | Freq: Two times a day (BID) | ORAL | Status: DC | PRN

## 2011-09-18 NOTE — Progress Notes (Signed)
Subjective:    Patient ID: Kevin Bradley, male    DOB: [birth year], 66 y.o.   MRN: [id number]  Thyroid Problem Presents for follow-up visit. Patient reports no anxiety, cold intolerance, constipation, depressed mood, diaphoresis, diarrhea, dry skin, fatigue, hair loss, heat intolerance, hoarse voice, leg swelling, menstrual problem, nail problem, palpitations, tremors, visual change, weight gain or weight loss. The symptoms have been stable.  Hypertension This is a chronic problem. The current episode started more than 1 year ago. The problem has been gradually improving since onset. The problem is controlled. Pertinent negatives include no anxiety, blurred vision, chest pain, headaches, malaise/fatigue, neck pain, orthopnea, palpitations, peripheral edema, PND, shortness of breath or sweats. Past treatments include angiotensin blockers and calcium channel blockers. The current treatment provides significant improvement. Compliance problems include exercise and diet.  Hypertensive end-organ damage includes a thyroid problem.      Review of Systems  Constitutional: Negative for fever, chills, weight loss, weight gain, malaise/fatigue, diaphoresis, activity change, appetite change, fatigue and unexpected weight change.  HENT: Negative.  Negative for hoarse voice and neck pain.   Eyes: Negative.  Negative for blurred vision.  Respiratory: Negative for cough, chest tightness, shortness of breath, wheezing and stridor.   Cardiovascular: Negative for chest pain, palpitations, orthopnea, leg swelling and PND.  Gastrointestinal: Negative for nausea, vomiting, abdominal pain, diarrhea, constipation and blood in stool.  Genitourinary: Negative for dysuria, urgency, frequency, hematuria, flank pain, decreased urine volume, discharge, penile swelling, scrotal swelling, enuresis, difficulty urinating, genital sores, penile pain, testicular pain and menstrual problem.  Musculoskeletal: Negative for myalgias,  back pain, joint swelling, arthralgias and gait problem.  Skin: Negative for color change, pallor, rash and wound.  Neurological: Negative for dizziness, tremors, seizures, syncope, facial asymmetry, speech difficulty, weakness, light-headedness, numbness and headaches.  Hematological: Negative for cold intolerance, heat intolerance and adenopathy. Does not bruise/bleed easily.  Psychiatric/Behavioral: Negative.        Objective:   Physical Exam  Vitals reviewed. Constitutional: He is oriented to person, place, and time. He appears well-developed and well-nourished. No distress.  HENT:  Head: Normocephalic and atraumatic.  Mouth/Throat: Oropharynx is clear and moist. No oropharyngeal exudate.  Eyes: Conjunctivae are normal. Right eye exhibits no discharge. Left eye exhibits no discharge. No scleral icterus.  Neck: Normal range of motion. Neck supple. No JVD present. No tracheal deviation present. No thyromegaly present.  Cardiovascular: Normal rate, regular rhythm, normal heart sounds and intact distal pulses.  Exam reveals no gallop and no friction rub.   No murmur heard. Pulmonary/Chest: Effort normal and breath sounds normal. No stridor. No respiratory distress. He has no wheezes. He has no rales. He exhibits no tenderness.  Abdominal: Soft. Bowel sounds are normal. He exhibits no distension and no mass. There is no tenderness. There is no rebound and no guarding. Hernia confirmed negative in the right inguinal area and confirmed negative in the left inguinal area.  Genitourinary: Rectum normal, testes normal and penis normal. Rectal exam shows no external hemorrhoid, no internal hemorrhoid, no fissure, no mass, no tenderness and anal tone normal. Guaiac negative stool. Prostate is enlarged (1+ smooth bilateral BPH). Prostate is not tender. Right testis shows no mass, no swelling and no tenderness. Right testis is descended. Cremasteric reflex is not absent on the right side. Left testis  shows no mass, no swelling and no tenderness. Left testis is descended. Cremasteric reflex is not absent on the left side. Circumcised. No penile tenderness. No discharge found.  Musculoskeletal: Normal  range of motion. He exhibits no edema and no tenderness.  Lymphadenopathy:    He has no cervical adenopathy.       Right: No inguinal adenopathy present.       Left: No inguinal adenopathy present.  Neurological: He is oriented to person, place, and time.  Skin: Skin is warm and dry. No rash noted. He is not diaphoretic. No erythema. No pallor.  Psychiatric: He has a normal mood and affect. His behavior is normal. Judgment and thought content normal.     Lab Results  Component Value Date   WBC 8.1 11/10/2010   HGB 18.6* 11/10/2010   HCT 53.3* 11/10/2010   PLT 203.0 11/10/2010   GLUCOSE 124* 07/09/2011   CHOL 146 08/22/2010   TRIG 120.0 08/22/2010   HDL 29.10* 08/22/2010   LDLCALC 93 08/22/2010   ALT 15 11/10/2010   AST 18 11/10/2010   NA 142 07/09/2011   K 3.6 07/09/2011   CL 106 07/09/2011   CREATININE 0.9 07/09/2011   BUN 16 07/09/2011   CO2 26 07/09/2011   TSH 3.00 11/10/2010   PSA 1.90 05/17/2009   HGBA1C 5.8 11/10/2010       Assessment & Plan:

## 2011-09-18 NOTE — Patient Instructions (Signed)
Health Maintenance, Males A healthy lifestyle and preventative care can promote health and wellness.  Maintain regular health, dental, and eye exams.   Eat a healthy diet. Foods like vegetables, fruits, whole grains, low-fat dairy products, and lean protein foods contain the nutrients you need without too many calories. Decrease your intake of foods high in solid fats, added sugars, and salt. Get information about a proper diet from your caregiver, if necessary.   Regular physical exercise is one of the most important things you can do for your health. Most adults should get at least 150 minutes of moderate-intensity exercise (any activity that increases your heart rate and causes you to sweat) each week. In addition, most adults need muscle-strengthening exercises on 2 or more days a week.    Maintain a healthy weight. The body mass index (BMI) is a screening tool to identify possible weight problems. It provides an estimate of body fat based on height and weight. Your caregiver can help determine your BMI, and can help you achieve or maintain a healthy weight. For adults 20 years and older:   A BMI below 18.5 is considered underweight.   A BMI of 18.5 to 24.9 is normal.   A BMI of 25 to 29.9 is considered overweight.   A BMI of 30 and above is considered obese.   Maintain normal blood lipids and cholesterol by exercising and minimizing your intake of saturated fat. Eat a balanced diet with plenty of fruits and vegetables. Blood tests for lipids and cholesterol should begin at age 20 and be repeated every 5 years. If your lipid or cholesterol levels are high, you are over 50, or you are a high risk for heart disease, you may need your cholesterol levels checked more frequently.Ongoing high lipid and cholesterol levels should be treated with medicines, if diet and exercise are not effective.   If you smoke, find out from your caregiver how to quit. If you do not use tobacco, do not start.    If you choose to drink alcohol, do not exceed 2 drinks per day. One drink is considered to be 12 ounces (355 mL) of beer, 5 ounces (148 mL) of wine, or 1.5 ounces (44 mL) of liquor.   Avoid use of street drugs. Do not share needles with anyone. Ask for help if you need support or instructions about stopping the use of drugs.   High blood pressure causes heart disease and increases the risk of stroke. Blood pressure should be checked at least every 1 to 2 years. Ongoing high blood pressure should be treated with medicines if weight loss and exercise are not effective.   If you are 45 to 67 years old, ask your caregiver if you should take aspirin to prevent heart disease.   Diabetes screening involves taking a blood sample to check your fasting blood sugar level. This should be done once every 3 years, after age 45, if you are within normal weight and without risk factors for diabetes. Testing should be considered at a younger age or be carried out more frequently if you are overweight and have at least 1 risk factor for diabetes.   Colorectal cancer can be detected and often prevented. Most routine colorectal cancer screening begins at the age of 50 and continues through age 75. However, your caregiver may recommend screening at an earlier age if you have risk factors for colon cancer. On a yearly basis, your caregiver may provide home test kits to check for hidden   blood in the stool. Use of a small camera at the end of a tube, to directly examine the colon (sigmoidoscopy or colonoscopy), can detect the earliest forms of colorectal cancer. Talk to your caregiver about this at age 50, when routine screening begins. Direct examination of the colon should be repeated every 5 to 10 years through age 75, unless early forms of pre-cancerous polyps or small growths are found.   Healthy men should no longer receive prostate-specific antigen (PSA) blood tests as part of routine cancer screening. Consult with  your caregiver about prostate cancer screening.   Practice safe sex. Use condoms and avoid high-risk sexual practices to reduce the spread of sexually transmitted infections (STIs).   Use sunscreen with a sun protection factor (SPF) of 30 or greater. Apply sunscreen liberally and repeatedly throughout the day. You should seek shade when your shadow is shorter than you. Protect yourself by wearing long sleeves, pants, a wide-brimmed hat, and sunglasses year round, whenever you are outdoors.   Notify your caregiver of new moles or changes in moles, especially if there is a change in shape or color. Also notify your caregiver if a mole is larger than the size of a pencil eraser.   A one-time screening for abdominal aortic aneurysm (AAA) and surgical repair of large AAAs by sound wave imaging (ultrasonography) is recommended for ages 65 to 75 years who are current or former smokers.   Stay current with your immunizations.  Document Released: 02/20/2008 Document Revised: 05/06/2011 Document Reviewed: 01/19/2011 [employer] Patient Information 2012 [employer]. 

## 2011-09-19 DIAGNOSIS — Z0000 Encounter for general adult medical examination without abnormal findings: Secondary | ICD-10-CM | POA: Insufficient documentation

## 2011-09-19 NOTE — Assessment & Plan Note (Signed)
I asked him to quit smoking 

## 2011-09-19 NOTE — Assessment & Plan Note (Signed)
He is due for an a1c today 

## 2011-09-19 NOTE — Assessment & Plan Note (Signed)
I will check his TSH today 

## 2011-09-19 NOTE — Assessment & Plan Note (Signed)
Exam done, labs ordered, vaccines updated, pt ed material was given as well

## 2011-09-19 NOTE — Assessment & Plan Note (Signed)
He has good rate and rhythm control 

## 2011-09-19 NOTE — Assessment & Plan Note (Signed)
He has no symptoms, I will check his PSA today 

## 2011-09-19 NOTE — Assessment & Plan Note (Signed)
His BP is well controlled, I will check his lytes and renal function today 

## 2011-09-27 ENCOUNTER — Other Ambulatory Visit: Payer: Self-pay | Admitting: Cardiology

## 2011-09-30 ENCOUNTER — Other Ambulatory Visit: Payer: Self-pay | Admitting: Dermatology

## 2011-10-15 ENCOUNTER — Encounter: Payer: Self-pay | Admitting: Internal Medicine

## 2011-10-15 ENCOUNTER — Ambulatory Visit (INDEPENDENT_AMBULATORY_CARE_PROVIDER_SITE_OTHER): Payer: BC Managed Care – PPO | Admitting: Internal Medicine

## 2011-10-15 VITALS — BP 140/80 | HR 60 | Temp 97.6°F | Resp 16 | Wt 205.0 lb

## 2011-10-15 DIAGNOSIS — I4891 Unspecified atrial fibrillation: Secondary | ICD-10-CM

## 2011-10-15 DIAGNOSIS — J069 Acute upper respiratory infection, unspecified: Secondary | ICD-10-CM | POA: Insufficient documentation

## 2011-10-15 DIAGNOSIS — K645 Perianal venous thrombosis: Secondary | ICD-10-CM

## 2011-10-15 DIAGNOSIS — I10 Essential (primary) hypertension: Secondary | ICD-10-CM

## 2011-10-15 MED ORDER — HYDROCORTISONE ACE-PRAMOXINE 1-1 % RE FOAM: 1.0000 | Freq: Two times a day (BID) | RECTAL | Status: AC

## 2011-10-15 NOTE — Assessment & Plan Note (Signed)
I and D was performed, he tolerated it well, he'll use proctofoam as needed

## 2011-10-15 NOTE — Assessment & Plan Note (Signed)
His BP is well controlled 

## 2011-10-15 NOTE — Assessment & Plan Note (Signed)
This is a viral illness that is resolving, he is getting adequate symptom relief with otc products

## 2011-10-15 NOTE — Patient Instructions (Signed)
Hemorrhoids Hemorrhoids are enlarged (dilated) veins around the rectum. There are 2 types of hemorrhoids, and the type of hemorrhoid is determined by its location. Internal hemorrhoids occur in the veins just inside the rectum.They are usually not painful, but they may bleed.However, they may poke through to the outside and become irritated and painful. External hemorrhoids involve the veins outside the anus and can be felt as a painful swelling or hard lump near the anus.They are often itchy and may crack and bleed. Sometimes clots will form in the veins. This makes them swollen and painful. These are called thrombosed hemorrhoids. CAUSES Causes of hemorrhoids include:  Pregnancy. This increases the pressure in the hemorrhoidal veins.   Constipation.   Straining to have a bowel movement.   Obesity.   Heavy lifting or other activity that caused you to strain.  TREATMENT Most of the time hemorrhoids improve in 1 to 2 weeks. However, if symptoms do not seem to be getting better or if you have a lot of rectal bleeding, your caregiver may perform a procedure to help make the hemorrhoids get smaller or remove them completely.Possible treatments include:  Rubber band ligation. A rubber band is placed at the base of the hemorrhoid to cut off the circulation.   Sclerotherapy. A chemical is injected to shrink the hemorrhoid.   Infrared light therapy. Tools are used to burn the hemorrhoid.   Hemorrhoidectomy. This is surgical removal of the hemorrhoid.  HOME CARE INSTRUCTIONS   Increase fiber in your diet. Ask your caregiver about using fiber supplements.   Drink enough water and fluids to keep your urine clear or pale yellow.   Exercise regularly.   Go to the bathroom when you have the urge to have a bowel movement. Do not wait.   Avoid straining to have bowel movements.   Keep the anal area dry and clean.   Only take over-the-counter or prescription medicines for pain, discomfort,  or fever as directed by your caregiver.  If your hemorrhoids are thrombosed:  Take warm sitz baths for 20 to 30 minutes, 3 to 4 times per day.   If the hemorrhoids are very tender and swollen, place ice packs on the area as tolerated. Using ice packs between sitz baths may be helpful. Fill a plastic bag with ice. Place a towel between the bag of ice and your skin.   Medicated creams and suppositories may be used or applied as directed.   Do not use a donut-shaped pillow or sit on the toilet for long periods. This increases blood pooling and pain.  SEEK MEDICAL CARE IF:   You have increasing pain and swelling that is not controlled with your medicine.   You have uncontrolled bleeding.   You have difficulty or you are unable to have a bowel movement.   You have pain or inflammation outside the area of the hemorrhoids.   You have chills or an oral temperature above 102 F (38.9 C).  MAKE SURE YOU:   Understand these instructions.   Will watch your condition.   Will get help right away if you are not doing well or get worse.  Document Released: 08/21/2000 Document Revised: 05/06/2011 Document Reviewed: 12/27/2007 [facility] Patient Information 2012 [place], [place].Upper Respiratory Infection, Adult An upper respiratory infection (URI) is also known as the common cold. It is often caused by a type of germ (virus). Colds are easily spread (contagious). You can pass it to others by kissing, coughing, sneezing, or drinking out of the  same glass. Usually, you get better in 1 or 2 weeks.  HOME CARE   Only take medicine as told by your doctor.   Use a warm mist humidifier or breathe in steam from a hot shower.   Drink enough water and fluids to keep your pee (urine) clear or pale yellow.   Get plenty of rest.   Return to work when your temperature is back to normal or as told by your doctor. You may use a face mask and wash your hands to stop your cold from spreading.  GET HELP RIGHT  AWAY IF:   After the first few days, you feel you are getting worse.   You have questions about your medicine.   You have chills, shortness of breath, or brown or red spit (mucus).   You have yellow or brown snot (nasal discharge) or pain in the face, especially when you bend forward.   You have a fever, puffy (swollen) neck, pain when you swallow, or white spots in the back of your throat.   You have a bad headache, ear pain, sinus pain, or chest pain.   You have a high-pitched whistling sound when you breathe in and out (wheezing).   You have a lasting cough or cough up blood.   You have sore muscles or a stiff neck.  MAKE SURE YOU:   Understand these instructions.   Will watch your condition.   Will get help right away if you are not doing well or get worse.  Document Released: 02/10/2008 Document Revised: 05/06/2011 Document Reviewed: 12/29/2010 [facility] Patient Information 2012 [place], [place].

## 2011-10-15 NOTE — Assessment & Plan Note (Signed)
He has good rate and rhythm control 

## 2011-10-15 NOTE — Progress Notes (Signed)
Subjective:    Patient ID: Kevin Bradley, male    DOB: 05/14/1946, 66 y.o.   MRN: [id number]  URI  This is a new problem. The current episode started 1 to 4 weeks ago. The problem has been gradually improving. There has been no fever. The fever has been present for less than 1 day. Associated symptoms include congestion, coughing (mild NP cough) and rhinorrhea. Pertinent negatives include no abdominal pain, chest pain, diarrhea, dysuria, ear pain, headaches, joint pain, joint swelling, nausea, neck pain, plugged ear sensation, rash, sinus pain, sneezing, sore throat, swollen glands, vomiting or wheezing. He has tried decongestant for the symptoms. The treatment provided significant relief.  Hypertension This is a chronic problem. The current episode started more than 1 year ago. The problem has been gradually improving since onset. The problem is controlled. Pertinent negatives include no anxiety, blurred vision, chest pain, headaches, malaise/fatigue, neck pain, orthopnea, palpitations, peripheral edema, PND, shortness of breath or sweats. There are no associated agents to hypertension. Past treatments include calcium channel blockers and angiotensin blockers. The current treatment provides significant improvement. Compliance problems include exercise and diet.       Review of Systems  Constitutional: Negative for fever, chills, malaise/fatigue, diaphoresis, activity change, appetite change, fatigue and unexpected weight change.  HENT: Positive for congestion and rhinorrhea. Negative for ear pain, sore throat, facial swelling, sneezing and neck pain.   Eyes: Negative.  Negative for blurred vision.  Respiratory: Positive for cough (mild NP cough). Negative for apnea, choking, chest tightness, shortness of breath, wheezing and stridor.   Cardiovascular: Negative for chest pain, palpitations, orthopnea, leg swelling and PND.  Gastrointestinal: Positive for rectal pain. Negative for nausea,  vomiting, abdominal pain, diarrhea, constipation, blood in stool, abdominal distention and anal bleeding.  Genitourinary: Negative.  Negative for dysuria.  Musculoskeletal: Negative for myalgias, back pain, joint pain, joint swelling, arthralgias and gait problem.  Skin: Negative for color change, rash and wound.  Neurological: Negative for dizziness, tremors, seizures, syncope, facial asymmetry, speech difficulty, weakness, light-headedness, numbness and headaches.  Hematological: Negative for adenopathy. Does not bruise/bleed easily.  Psychiatric/Behavioral: Negative.        Objective:   Physical Exam  Vitals reviewed. Constitutional: He is oriented to person, place, and time. He appears well-developed and well-nourished. No distress.  HENT:  Head: Normocephalic and atraumatic.  Mouth/Throat: Oropharynx is clear and moist. No oropharyngeal exudate.  Eyes: Conjunctivae are normal. Right eye exhibits no discharge. Left eye exhibits no discharge. No scleral icterus.  Neck: Normal range of motion. Neck supple. No JVD present. No tracheal deviation present. No thyromegaly present.  Cardiovascular: Normal rate, regular rhythm, normal heart sounds and intact distal pulses.  Exam reveals no gallop and no friction rub.   No murmur heard. Pulmonary/Chest: Effort normal and breath sounds normal. No stridor. No respiratory distress. He has no wheezes. He has no rales. He exhibits no tenderness.  Abdominal: Soft. Bowel sounds are normal. He exhibits no distension and no mass. There is no tenderness. There is no rebound and no guarding.  Genitourinary: Rectal exam shows external hemorrhoid and internal hemorrhoid. Rectal exam shows no fissure, no mass, no tenderness and anal tone normal. Guaiac negative stool.       Over the left lateral anal area there is a medium-sized thrombosed external anal hemorrhoid with palpable clot formation, that area was cleaned with betadine then prepped and draped in  sterile fashion, local anesthesia was obtained with 2% plain lidocaine then a small incision  was made with an 11- blade and a moderate amount of clot was released, there was no significant blood loss and hemostasis was obtained afterwards, he tolerated the procedure well.  Musculoskeletal: Normal range of motion. He exhibits no edema and no tenderness.  Lymphadenopathy:    He has no cervical adenopathy.  Neurological: He is oriented to person, place, and time.  Skin: Skin is warm and dry. No rash noted. He is not diaphoretic. No erythema. No pallor.  Psychiatric: He has a normal mood and affect. His behavior is normal. Judgment and thought content normal.      Lab Results  Component Value Date   WBC 6.9 09/18/2011   HGB 17.8* 09/18/2011   HCT 51.2 09/18/2011   PLT 217.0 09/18/2011   GLUCOSE 103* 09/18/2011   CHOL 159 09/18/2011   TRIG 133.0 09/18/2011   HDL 31.80* 09/18/2011   LDLCALC 101* 09/18/2011   ALT 18 09/18/2011   AST 19 09/18/2011   NA 140 09/18/2011   K 4.2 09/18/2011   CL 105 09/18/2011   CREATININE 1.0 09/18/2011   BUN 19 09/18/2011   CO2 26 09/18/2011   TSH 1.69 09/18/2011   PSA 1.98 09/18/2011   HGBA1C 5.7 09/18/2011      Assessment & Plan:

## 2011-10-15 NOTE — Assessment & Plan Note (Signed)
Refer to Dr. Kinnie Scales for a definitive procedure

## 2011-11-06 ENCOUNTER — Ambulatory Visit (INDEPENDENT_AMBULATORY_CARE_PROVIDER_SITE_OTHER)
Admission: RE | Admit: 2011-11-06 | Discharge: 2011-11-06 | Disposition: A | Discharge status: Home / Self Care | Payer: BC Managed Care – PPO | Source: Ambulatory Visit | Attending: Internal Medicine | Admitting: Internal Medicine

## 2011-11-06 ENCOUNTER — Encounter: Payer: Self-pay | Admitting: Internal Medicine

## 2011-11-06 ENCOUNTER — Ambulatory Visit (INDEPENDENT_AMBULATORY_CARE_PROVIDER_SITE_OTHER): Payer: BC Managed Care – PPO | Admitting: Internal Medicine

## 2011-11-06 DIAGNOSIS — J209 Acute bronchitis, unspecified: Secondary | ICD-10-CM

## 2011-11-06 DIAGNOSIS — R05 Cough: Secondary | ICD-10-CM

## 2011-11-06 DIAGNOSIS — J449 Chronic obstructive pulmonary disease, unspecified: Secondary | ICD-10-CM | POA: Insufficient documentation

## 2011-11-06 DIAGNOSIS — R059 Cough, unspecified: Secondary | ICD-10-CM | POA: Insufficient documentation

## 2011-11-06 DIAGNOSIS — J4489 Other specified chronic obstructive pulmonary disease: Secondary | ICD-10-CM | POA: Insufficient documentation

## 2011-11-06 DIAGNOSIS — I10 Essential (primary) hypertension: Secondary | ICD-10-CM

## 2011-11-06 DIAGNOSIS — J42 Unspecified chronic bronchitis: Secondary | ICD-10-CM

## 2011-11-06 MED ORDER — CEFUROXIME AXETIL 500 MG PO TABS: 500.0000 mg | ORAL_TABLET | Freq: Two times a day (BID) | ORAL | Status: AC

## 2011-11-06 MED ORDER — ACLIDINIUM BROMIDE 400 MCG/ACT IN AEPB: 1.0000 | INHALATION_SPRAY | Freq: Two times a day (BID) | RESPIRATORY_TRACT | Status: DC

## 2011-11-06 MED ORDER — OLMESARTAN MEDOXOMIL-HCTZ 40-12.5 MG PO TABS: 1.0000 | ORAL_TABLET | Freq: Every day | ORAL | Status: DC

## 2011-11-06 MED ORDER — HYDROCOD POLST-CPM POLST ER 10-8 MG PO CP12: 1.0000 | ORAL_CAPSULE | Freq: Two times a day (BID) | ORAL | Status: DC | PRN

## 2011-11-06 NOTE — Assessment & Plan Note (Signed)
His BP is not well controlled so I changed his med to benicar-hct

## 2011-11-06 NOTE — Assessment & Plan Note (Signed)
I think he would benefit from [place]

## 2011-11-06 NOTE — Assessment & Plan Note (Signed)
Will check a CXR to look for pna, mass, edema, etc

## 2011-11-06 NOTE — Progress Notes (Signed)
Subjective:    Patient ID: Kevin Bradley, male    DOB: August 01, 1946, 66 y.o.   MRN: [id number]  Cough This is a recurrent problem. The current episode started 1 to 4 weeks ago. The problem has been gradually worsening. The problem occurs every few hours. The cough is productive of sputum. Associated symptoms include shortness of breath and wheezing. Pertinent negatives include no chest pain, chills, ear congestion, ear pain, eye redness, fever, headaches, heartburn, hemoptysis, myalgias, nasal congestion, postnasal drip, rash, sore throat, sweats or weight loss. The symptoms are aggravated by cold air. He has tried oral steroids for the symptoms. The treatment provided moderate relief. His past medical history is significant for emphysema.  Hypertension This is a chronic problem. The current episode started more than 1 year ago. The problem has been gradually worsening since onset. The problem is uncontrolled. Associated symptoms include shortness of breath. Pertinent negatives include no anxiety, blurred vision, chest pain, headaches, malaise/fatigue, neck pain, orthopnea, palpitations, peripheral edema, PND or sweats. There are no associated agents to hypertension. Past treatments include angiotensin blockers and calcium channel blockers. The current treatment provides moderate improvement. Compliance problems include exercise and diet.       Review of Systems  Constitutional: Negative for fever, chills, weight loss, malaise/fatigue, diaphoresis, activity change, appetite change, fatigue and unexpected weight change.  HENT: Negative for ear pain, sore throat, neck pain and postnasal drip.   Eyes: Negative for blurred vision, photophobia, pain, discharge, redness, itching and visual disturbance.  Respiratory: Positive for cough, shortness of breath and wheezing. Negative for apnea, hemoptysis, choking, chest tightness and stridor.   Cardiovascular: Negative for chest pain, palpitations, orthopnea,  leg swelling and PND.  Gastrointestinal: Negative for heartburn, nausea, vomiting, abdominal pain, diarrhea, constipation, blood in stool and abdominal distention.  Genitourinary: Negative.   Musculoskeletal: Negative for myalgias, back pain, joint swelling, arthralgias and gait problem.  Skin: Negative for color change, pallor, rash and wound.  Neurological: Negative for dizziness, tremors, seizures, syncope, facial asymmetry, speech difficulty, weakness, light-headedness, numbness and headaches.  Hematological: Negative for adenopathy. Does not bruise/bleed easily.  Psychiatric/Behavioral: Negative.        Objective:   Physical Exam  Vitals reviewed. Constitutional: He is oriented to person, place, and time. He appears well-developed and well-nourished. No distress.  HENT:  Head: Normocephalic and atraumatic.  Mouth/Throat: Oropharynx is clear and moist. No oropharyngeal exudate.  Eyes: Conjunctivae are normal. Right eye exhibits no discharge. Left eye exhibits no discharge. No scleral icterus.  Neck: Normal range of motion. Neck supple. No JVD present. No tracheal deviation present. No thyromegaly present.  Cardiovascular: Normal rate, regular rhythm, normal heart sounds and intact distal pulses.  Exam reveals no gallop and no friction rub.   No murmur heard. Pulmonary/Chest: Effort normal. No accessory muscle usage or stridor. Not tachypneic. No respiratory distress. He has no decreased breath sounds. He has no wheezes. He has rhonchi in the right upper field, the right middle field, the left upper field and the left middle field. He has rales in the left middle field. He exhibits no tenderness.  Abdominal: Soft. Bowel sounds are normal. He exhibits no distension and no mass. There is no tenderness. There is no rebound and no guarding.  Musculoskeletal: Normal range of motion. He exhibits no edema and no tenderness.  Lymphadenopathy:    He has no cervical adenopathy.  Neurological: He  is oriented to person, place, and time.  Skin: Skin is warm and dry. No rash  noted. He is not diaphoretic. No erythema. No pallor.  Psychiatric: He has a normal mood and affect. His behavior is normal. Judgment and thought content normal.      Lab Results  Component Value Date   WBC 6.9 09/18/2011   HGB 17.8* 09/18/2011   HCT 51.2 09/18/2011   PLT 217.0 09/18/2011   GLUCOSE 103* 09/18/2011   CHOL 159 09/18/2011   TRIG 133.0 09/18/2011   HDL 31.80* 09/18/2011   LDLCALC 101* 09/18/2011   ALT 18 09/18/2011   AST 19 09/18/2011   NA 140 09/18/2011   K 4.2 09/18/2011   CL 105 09/18/2011   CREATININE 1.0 09/18/2011   BUN 19 09/18/2011   CO2 26 09/18/2011   TSH 1.69 09/18/2011   PSA 1.98 09/18/2011   HGBA1C 5.7 09/18/2011      Assessment & Plan:

## 2011-11-06 NOTE — Assessment & Plan Note (Signed)
Start ceftin for the infection and start a cough suppressant

## 2011-11-06 NOTE — Patient Instructions (Signed)
Hypertension As your heart beats, it forces blood through your arteries. This force is your blood pressure. If the pressure is too high, it is called hypertension (HTN) or high blood pressure. HTN is dangerous because you may have it and not know it. High blood pressure may mean that your heart has to work harder to pump blood. Your arteries may be narrow or stiff. The extra work puts you at risk for heart disease, stroke, and other problems.  Blood pressure consists of two numbers, a higher number over a lower, 110/72, for example. It is stated as "110 over 72." The ideal is below 120 for the top number (systolic) and under 80 for the bottom (diastolic). Write down your blood pressure today. You should pay close attention to your blood pressure if you have certain conditions such as:  Heart failure.   Prior heart attack.   Diabetes   Chronic kidney disease.   Prior stroke.   Multiple risk factors for heart disease.  To see if you have HTN, your blood pressure should be measured while you are seated with your arm held at the level of the heart. It should be measured at least twice. A one-time elevated blood pressure reading (especially in the Emergency Department) does not mean that you need treatment. There may be conditions in which the blood pressure is different between your right and left arms. It is important to see your caregiver soon for a recheck. Most people have essential hypertension which means that there is not a specific cause. This type of high blood pressure may be lowered by changing lifestyle factors such as:  Stress.   Smoking.   Lack of exercise.   Excessive weight.   Drug/tobacco/alcohol use.   Eating less salt.  Most people do not have symptoms from high blood pressure until it has caused damage to the body. Effective treatment can often prevent, delay or reduce that damage. TREATMENT  When a cause has been identified, treatment for high blood pressure is  directed at the cause. There are a large number of medications to treat HTN. These fall into several categories, and your caregiver will help you select the medicines that are best for you. Medications may have side effects. You should review side effects with your caregiver. If your blood pressure stays high after you have made lifestyle changes or started on medicines,   Your medication(s) may need to be changed.   Other problems may need to be addressed.   Be certain you understand your prescriptions, and know how and when to take your medicine.   Be sure to follow up with your caregiver within the time frame advised (usually within two weeks) to have your blood pressure rechecked and to review your medications.   If you are taking more than one medicine to lower your blood pressure, make sure you know how and at what times they should be taken. Taking two medicines at the same time can result in blood pressure that is too low.  SEEK IMMEDIATE MEDICAL CARE IF:  You develop a severe headache, blurred or changing vision, or confusion.   You have unusual weakness or numbness, or a faint feeling.   You have severe chest or abdominal pain, vomiting, or breathing problems.  MAKE SURE YOU:   Understand these instructions.   Will watch your condition.   Will get help right away if you are not doing well or get worse.  Document Released: 08/24/2005 Document Revised: 05/06/2011 Document Reviewed:   04/13/2008 ExitCare Patient Information 2012 [employer].Acute Bronchitis You have acute bronchitis. This means you have a chest cold. The airways in your lungs are red and sore (inflamed). Acute means it is sudden onset.  CAUSES Bronchitis is most often caused by the same virus that causes a cold. SYMPTOMS   Body aches.   Chest congestion.   Chills.   Cough.   Fever.   Shortness of breath.   Sore throat.  TREATMENT  Acute bronchitis is usually treated with rest, fluids, and  medicines for relief of fever or cough. Most symptoms should go away after a few days or a week. Increased fluids may help thin your secretions and will prevent dehydration. Your caregiver may give you an inhaler to improve your symptoms. The inhaler reduces shortness of breath and helps control cough. You can take over-the-counter pain relievers or cough medicine to decrease coughing, pain, or fever. A cool-air vaporizer may help thin bronchial secretions and make it easier to clear your chest. Antibiotics are usually not needed but can be prescribed if you smoke, are seriously ill, have chronic lung problems, are elderly, or you are at higher risk for developing complications.Allergies and asthma can make bronchitis worse. Repeated episodes of bronchitis may cause longstanding lung problems. Avoid smoking and secondhand smoke.Exposure to cigarette smoke or irritating chemicals will make bronchitis worse. If you are a cigarette smoker, consider using nicotine gum or skin patches to help control withdrawal symptoms. Quitting smoking will help your lungs heal faster. Recovery from bronchitis is often slow, but you should start feeling better after 2 to 3 days. Cough from bronchitis frequently lasts for 3 to 4 weeks. To prevent another bout of acute bronchitis:  Quit smoking.   Wash your hands frequently to get rid of viruses or use a hand sanitizer.   Avoid other people with cold or virus symptoms.   Try not to touch your hands to your mouth, nose, or eyes.  SEEK IMMEDIATE MEDICAL CARE IF:  You develop increased fever, chills, or chest pain.   You have severe shortness of breath or bloody sputum.   You develop dehydration, fainting, repeated vomiting, or a severe headache.   You have no improvement after 1 week of treatment or you get worse.  MAKE SURE YOU:   Understand these instructions.   Will watch your condition.   Will get help right away if you are not doing well or get worse.    Document Released: 10/01/2004 Document Revised: 05/06/2011 Document Reviewed: 12/17/2010 Exit[employer] Patient Information 2012 [employer]. 

## 2011-12-28 ENCOUNTER — Ambulatory Visit (INDEPENDENT_AMBULATORY_CARE_PROVIDER_SITE_OTHER): Payer: BC Managed Care – PPO | Admitting: Cardiology

## 2011-12-28 ENCOUNTER — Encounter: Payer: Self-pay | Admitting: Cardiology

## 2011-12-28 VITALS — BP 138/82 | HR 50 | Ht 72.0 in | Wt 199.1 lb

## 2011-12-28 DIAGNOSIS — I10 Essential (primary) hypertension: Secondary | ICD-10-CM

## 2011-12-28 DIAGNOSIS — I4891 Unspecified atrial fibrillation: Secondary | ICD-10-CM

## 2011-12-28 MED ORDER — OLMESARTAN MEDOXOMIL-HCTZ 40-12.5 MG PO TABS: 1.0000 | ORAL_TABLET | Freq: Every day | ORAL | Status: DC

## 2011-12-28 NOTE — Assessment & Plan Note (Signed)
Blood pressure controlled. Continue present medications. 

## 2011-12-28 NOTE — Assessment & Plan Note (Signed)
Plan continue flecainide, Cardizem and aspirin. We again discussed xeralto or pradaxa (embolic risk factors of age greater than 73 and hypertension). He has declined. He would like to continue aspirin. He understands higher risk of CVA.

## 2011-12-28 NOTE — Progress Notes (Signed)
HPI: Pleasant gentleman with PMH atrial fibrillation for f/iu. Note TSH in March of 2011 was normal at 1.56. A CardioNet monitor in February revealed sinus rhythm with occasional bursts of atrial fibrillation. The patient states he has had paroxysmal atrial fibrillation for 40 years. He had an episode of chest pain approximately 9 years ago and had a cardiac catheterization that was normal by his report. He previously lived in [place]. He had an exercise stress test approximately one year ago that was normal. An echocardiogram in May of 2011 revealed normal LV function, trivial aortic insufficiency and mild left atrial enlargement. Patient placed on flecanide previously. A followup stress echocardiogram showed no ischemia and normal LV function. There was a question of septal hypokinesis both at rest and with stress. There was no exercise induced arrhythmias. Cardionet in Aug 2012 showed sinus with PAT but no VT. Since I last saw him in Oct 2012, there is no dyspnea or chest pain. He continues to have occasional palpitations but not sustained; no syncope.   Current Outpatient Prescriptions  Medication Sig Dispense Refill  . aspirin EC 325 MG tablet Take 325 mg by mouth daily.        Marland Kitchen diltiazem (TAZTIA XT) 180 MG 24 hr capsule Take 1 capsule (180 mg total) by mouth daily.  90 capsule  3  . Fish Oil OIL Take 1 tablet by mouth daily.        . flecainide (TAMBOCOR) 50 MG tablet TAKE 2 TABLETS BY MOUTH EVERY DAY  60 tablet  11  . levothyroxine (SYNTHROID) 50 MCG tablet Take 1 tablet (50 mcg total) by mouth daily.  90 tablet  1  . olmesartan-hydrochlorothiazide (BENICAR HCT) 40-12.5 MG per tablet Take 1 tablet by mouth daily.  90 tablet  3  . VITAMIN D, CHOLECALCIFEROL, PO Take 2 tablets by mouth daily.        . Aclidinium Bromide (TUDORZA PRESSAIR) 400 MCG/ACT AEPB Inhale 1 Act into the lungs 2 (two) times daily.  1 each  11  . Hydrocod Polst-Chlorphen Polst 10-8 MG CP12 Take 1 tablet by mouth 2 (two)  times daily as needed.  25 each  1     Past Medical History  Diagnosis Date  . Atrial fibrillation   . HTN (hypertension)   . External thrombosed hemorrhoids   . Hx of colonic polyps   . BPH (benign prostatic hypertrophy)   . Hypothyroidism   . Anxiety   . Low HDL (under 40)   . Nephrolithiasis   . History of colonoscopy     Past Surgical History  Procedure Date  . Inguinal hernia repair   . Tonsillectomy   . Thyroidectomy, partial   . Appendectomy     History   Social History  . Marital Status: Married    Spouse Name: N/A    Number of Children: N/A  . 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 Smoking status: Former Smoker    Types: Cigars    Quit date: 10/08/2010  . Smokeless tobacco: Not on file  . Alcohol Use: No  . Drug Use: No  . Sexually Active: Yes   Other Topics Concern  . Not on file   Social History Narrative  . No narrative on file    ROS: no fevers or chills, productive cough, hemoptysis, dysphasia, odynophagia, melena, hematochezia, dysuria, hematuria, rash, seizure activity, orthopnea, PND, pedal edema, claudication. Remaining systems  are negative.  Physical Exam: Well-developed well-nourished in no acute distress.  Skin is warm and dry.  HEENT is normal.  Neck is supple. No thyromegaly.  Chest is clear to auscultation with normal expansion.  Cardiovascular exam is regular rate and rhythm. 2/6 systolic murmur left sternal border Abdominal exam nontender or distended. No masses palpated. Extremities show no edema. neuro grossly intact  ECG Marke Sinus bradycardia, RVCD, with no ST changes.

## 2011-12-28 NOTE — Patient Instructions (Signed)
The current medical regimen is effective;  continue present plan and medications.  Follow up in 1 year with Dr Crenshaw.  You will receive a letter in the mail 2 months before you are due.  Please call us when you receive this letter to schedule your follow up appointment.  

## 2012-03-24 ENCOUNTER — Other Ambulatory Visit: Payer: Self-pay | Admitting: Internal Medicine

## 2012-03-28 ENCOUNTER — Telehealth: Payer: Self-pay | Admitting: Internal Medicine

## 2012-03-28 DIAGNOSIS — E039 Hypothyroidism, unspecified: Secondary | ICD-10-CM

## 2012-03-28 MED ORDER — LEVOTHYROXINE SODIUM 50 MCG PO TABS: 50.0000 ug | ORAL_TABLET | Freq: Every day | ORAL | Status: DC

## 2012-03-28 NOTE — Telephone Encounter (Signed)
Pt advised of refill via VM

## 2012-03-28 NOTE — Telephone Encounter (Signed)
Pt req refill of Synthroid 50 MCG until he comes in for appt on 04/01/12 for CPX. Pt  req med to be send to [facility] on [address]. Please call pt once its done ASAP, pt is worry because he is out of his med and he will be out of town tomorrow.

## 2012-04-01 ENCOUNTER — Encounter: Payer: Self-pay | Admitting: Internal Medicine

## 2012-04-01 ENCOUNTER — Other Ambulatory Visit (INDEPENDENT_AMBULATORY_CARE_PROVIDER_SITE_OTHER): Payer: BC Managed Care – PPO

## 2012-04-01 ENCOUNTER — Ambulatory Visit (INDEPENDENT_AMBULATORY_CARE_PROVIDER_SITE_OTHER): Payer: BC Managed Care – PPO | Admitting: Internal Medicine

## 2012-04-01 VITALS — BP 122/78 | HR 61 | Temp 97.4°F | Resp 20 | Wt 201.0 lb

## 2012-04-01 DIAGNOSIS — E039 Hypothyroidism, unspecified: Secondary | ICD-10-CM

## 2012-04-01 DIAGNOSIS — E876 Hypokalemia: Secondary | ICD-10-CM

## 2012-04-01 DIAGNOSIS — I10 Essential (primary) hypertension: Secondary | ICD-10-CM

## 2012-04-01 DIAGNOSIS — I4891 Unspecified atrial fibrillation: Secondary | ICD-10-CM

## 2012-04-01 LAB — BASIC METABOLIC PANEL
BUN: 17 mg/dL (ref 6–23)
CO2: 28 mEq/L (ref 19–32)
Chloride: 98 mEq/L (ref 96–112)
GFR: 75.75 mL/min (ref >60.00)
Glucose, Bld: 153 mg/dL — ABNORMAL HIGH (ref 70–99)
Potassium: 3.3 mEq/L — ABNORMAL LOW (ref 3.5–5.1)
Sodium: 137 mEq/L (ref 135–145)

## 2012-04-01 MED ORDER — POTASSIUM CHLORIDE CRYS ER 20 MEQ PO TBCR: 20.0000 meq | EXTENDED_RELEASE_TABLET | Freq: Two times a day (BID) | ORAL | Status: DC

## 2012-04-01 NOTE — Patient Instructions (Signed)

## 2012-04-01 NOTE — Assessment & Plan Note (Signed)
I will check his TSH level today 

## 2012-04-01 NOTE — Progress Notes (Signed)
  Subjective:    Patient ID: Kevin Bradley, male    DOB: 04-03-1946, 66 y.o.   MRN: [id number]  Thyroid Problem Presents for follow-up visit. Patient reports no anxiety, cold intolerance, constipation, depressed mood, diaphoresis, diarrhea, dry skin, hair loss, heat intolerance, hoarse voice, leg swelling, palpitations, tremors, visual change, weight gain or weight loss. The symptoms have been stable.      Review of Systems  Constitutional: Negative.  Negative for weight loss, weight gain and diaphoresis.  HENT: Negative.  Negative for hoarse voice.   Eyes: Negative.   Respiratory: Negative for cough, chest tightness, shortness of breath, wheezing and stridor.   Cardiovascular: Negative for chest pain, palpitations and leg swelling.  Gastrointestinal: Negative.  Negative for diarrhea and constipation.  Genitourinary: Negative.   Musculoskeletal: Negative.   Skin: Negative.   Neurological: Negative.  Negative for tremors.  Hematological: Negative for cold intolerance, heat intolerance and adenopathy. Does not bruise/bleed easily.  Psychiatric/Behavioral: Negative.        Objective:   Physical Exam  Vitals reviewed. Constitutional: He is oriented to person, place, and time. He appears well-developed and well-nourished. No distress.  HENT:  Head: Normocephalic and atraumatic.  Mouth/Throat: Oropharynx is clear and moist. No oropharyngeal exudate.  Eyes: Conjunctivae are normal. Right eye exhibits no discharge. Left eye exhibits no discharge. No scleral icterus.  Neck: Normal range of motion. Neck supple. No JVD present. No tracheal deviation present. No thyromegaly present.  Cardiovascular: Normal rate, regular rhythm, normal heart sounds and intact distal pulses.  Exam reveals no gallop and no friction rub.   No murmur heard. Pulmonary/Chest: Effort normal and breath sounds normal. No stridor. No respiratory distress. He has no wheezes. He has no rales. He exhibits no tenderness.    Abdominal: Soft. Bowel sounds are normal. He exhibits no distension and no mass. There is no tenderness. There is no rebound and no guarding.  Musculoskeletal: Normal range of motion. He exhibits no edema and no tenderness.  Lymphadenopathy:    He has no cervical adenopathy.  Neurological: He is oriented to person, place, and time.  Skin: Skin is warm and dry. No rash noted. He is not diaphoretic. No erythema. No pallor.  Psychiatric: He has a normal mood and affect. His behavior is normal. Judgment and thought content normal.      Lab Results  Component Value Date   WBC 6.9 09/18/2011   HGB 17.8* 09/18/2011   HCT 51.2 09/18/2011   PLT 217.0 09/18/2011   GLUCOSE 103* 09/18/2011   CHOL 159 09/18/2011   TRIG 133.0 09/18/2011   HDL 31.80* 09/18/2011   LDLCALC 101* 09/18/2011   ALT 18 09/18/2011   AST 19 09/18/2011   NA 140 09/18/2011   K 4.2 09/18/2011   CL 105 09/18/2011   CREATININE 1.0 09/18/2011   BUN 19 09/18/2011   CO2 26 09/18/2011   TSH 1.69 09/18/2011   PSA 1.98 09/18/2011   HGBA1C 5.7 09/18/2011      Assessment & Plan:

## 2012-04-01 NOTE — Assessment & Plan Note (Signed)
He has good rate and rhythm control 

## 2012-04-01 NOTE — Addendum Note (Signed)
Addended by: Etta Grandchild on: 04/01/2012 03:07 PM   Modules accepted: Orders

## 2012-04-01 NOTE — Assessment & Plan Note (Signed)
His BP is well controlled, I will check his lytes and renal function today 

## 2012-04-21 ENCOUNTER — Other Ambulatory Visit: Payer: Self-pay

## 2012-05-10 ENCOUNTER — Telehealth: Payer: Self-pay

## 2012-05-10 ENCOUNTER — Other Ambulatory Visit (INDEPENDENT_AMBULATORY_CARE_PROVIDER_SITE_OTHER): Payer: BC Managed Care – PPO

## 2012-05-10 DIAGNOSIS — I4891 Unspecified atrial fibrillation: Secondary | ICD-10-CM

## 2012-05-10 DIAGNOSIS — I10 Essential (primary) hypertension: Secondary | ICD-10-CM

## 2012-05-10 DIAGNOSIS — E278 Other specified disorders of adrenal gland: Secondary | ICD-10-CM

## 2012-05-10 LAB — BASIC METABOLIC PANEL
BUN: 21 mg/dL (ref 6–23)
CO2: 27 mEq/L (ref 19–32)
Chloride: 104 mEq/L (ref 96–112)
Potassium: 3.9 mEq/L (ref 3.5–5.1)

## 2012-05-10 NOTE — Telephone Encounter (Signed)
yes

## 2012-05-10 NOTE — Telephone Encounter (Signed)
Patient notified and order placed in Epic

## 2012-05-10 NOTE — Telephone Encounter (Signed)
Patient walked in stating that he received letter to have potassium rechecked in 6wk ( last appt 04/01/12). Please advise if ok to place order while pt is here ar would you like to see him first. Thanks

## 2012-05-13 IMAGING — CR DG CHEST 2V
2 series · 2 of 2 positions shown · non-contrast
Comparison: Chest x-ray of 10/22/2009

CLINICAL DATA: Cough, congestion

CHEST - 2 VIEW

[view not recorded (1 of 2)]
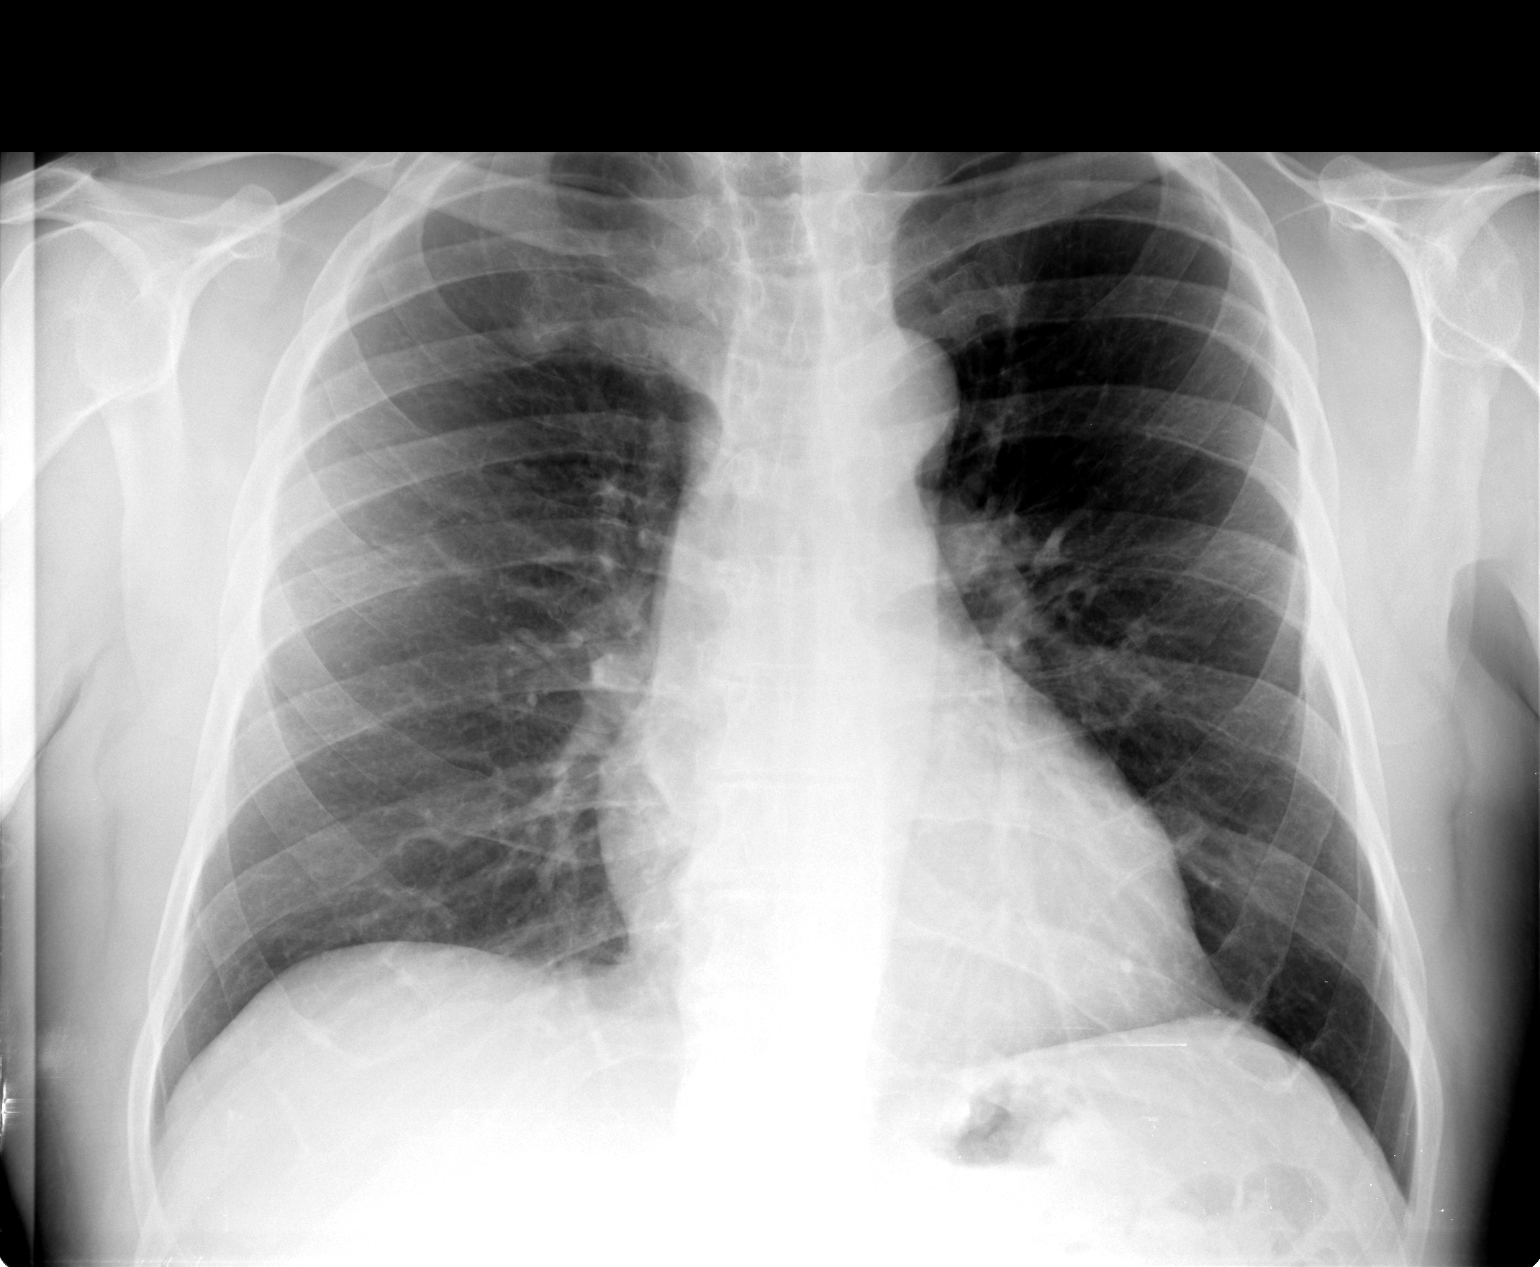

[view not recorded (2 of 2)]
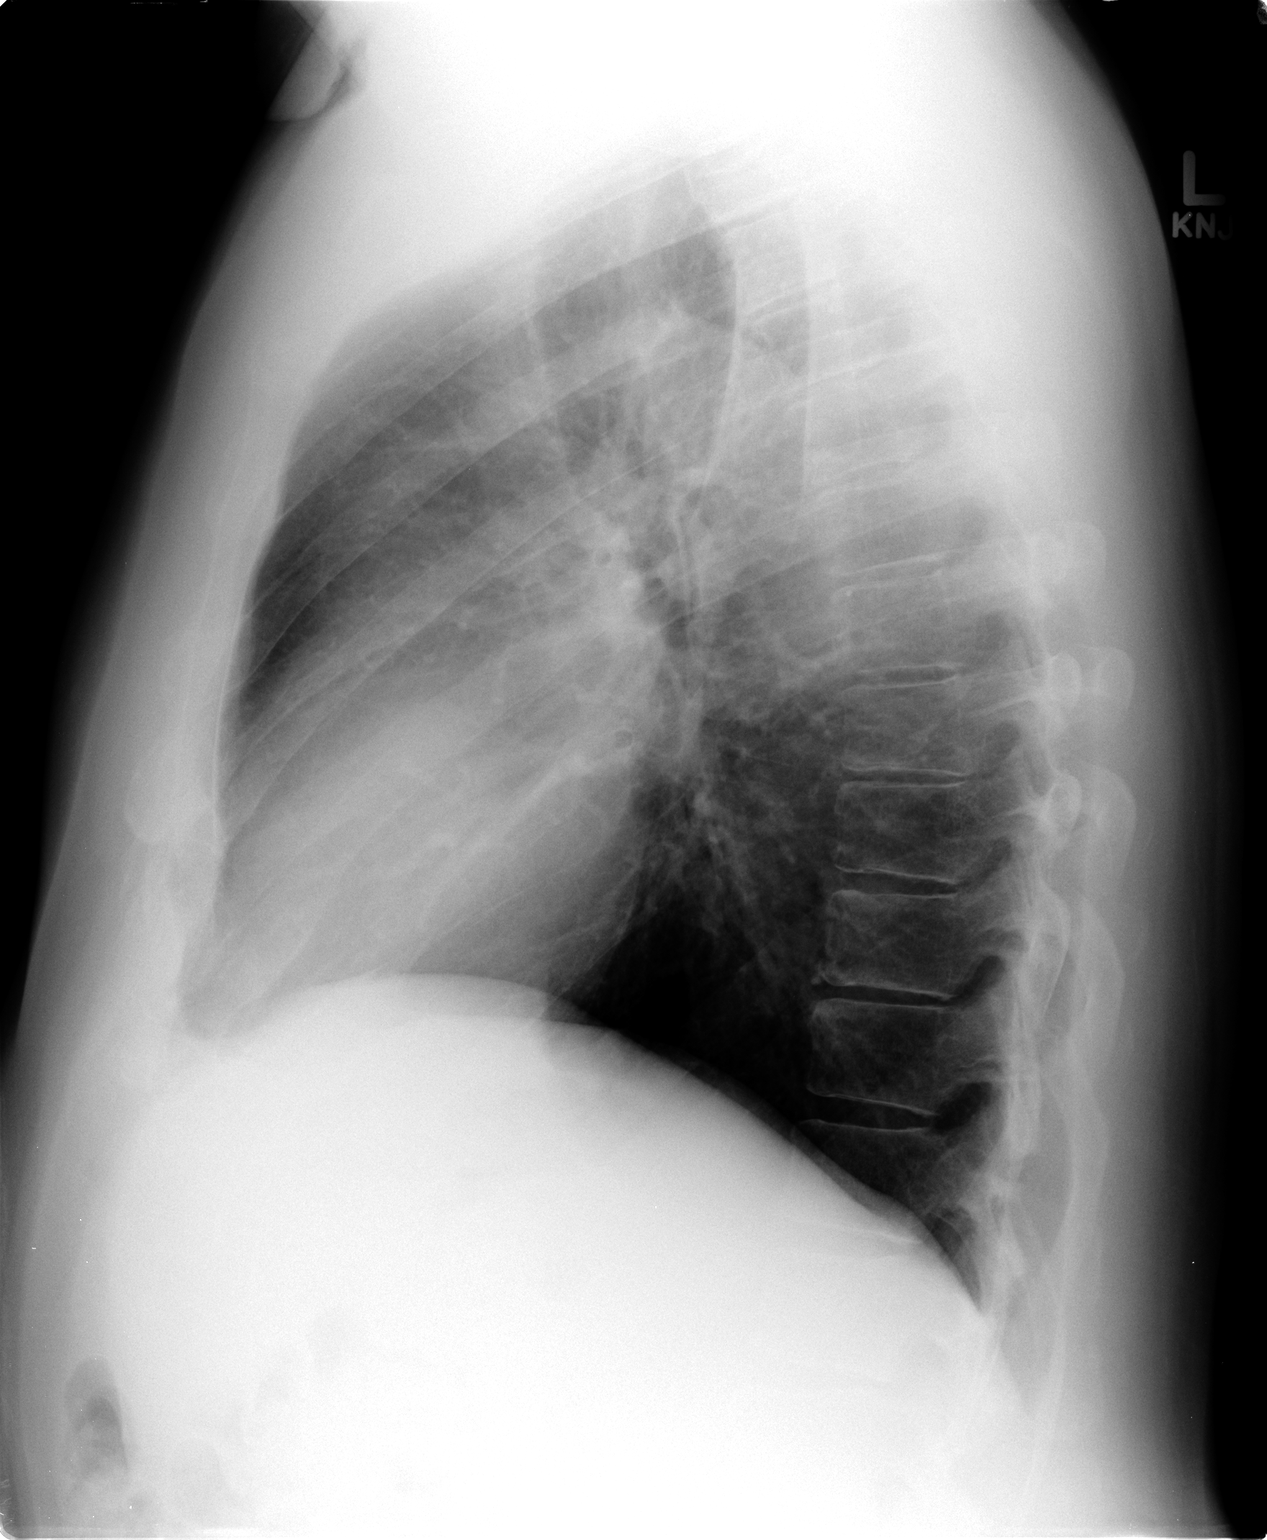

[2 of 2 positions shown; findings below may reference images not displayed]

FINDINGS: No active infiltrate or effusion is seen.  There is mild
peribronchial thickening which may indicate bronchitis.  The heart
is within upper limits of normal.  No bony abnormality is seen.
IMPRESSION: No pneumonia.  Mild peribronchial thickening may indicate a

## 2012-05-16 ENCOUNTER — Telehealth: Payer: Self-pay | Admitting: Cardiology

## 2012-05-16 NOTE — Telephone Encounter (Signed)
Pt calling debra mathis back--results of BMET given to pt

## 2012-05-16 NOTE — Telephone Encounter (Signed)
Patient returning nurse call, he can be reached at [phone]

## 2012-09-20 ENCOUNTER — Other Ambulatory Visit: Payer: Self-pay | Admitting: Cardiology

## 2012-09-21 ENCOUNTER — Telehealth: Payer: Self-pay | Admitting: Internal Medicine

## 2012-09-21 DIAGNOSIS — E039 Hypothyroidism, unspecified: Secondary | ICD-10-CM

## 2012-09-21 MED ORDER — LEVOTHYROXINE SODIUM 50 MCG PO TABS: 50.0000 ug | ORAL_TABLET | Freq: Every day | ORAL | Status: DC

## 2012-09-21 NOTE — Telephone Encounter (Signed)
[facility] Triage Call Report Triage Record Num: [id number] Operator: Andreas Ohm Patient Name: Kevin Bradley Call Date & Time: 09/19/2012 5:48:08PM Patient Phone: [phone] PCP: Sanda Linger Patient Gender: Male PCP Fax : Patient DOB: September 05, 1946 Practice Name: [facility] Reason for Call: Caller: Ayub/Patient; PCP: Sanda Linger (Adults only); CB#: [phone]; Call regarding Cough/Congestion; Onset over the weekend. Afebrile. Yellow tinged sputum. Taking Dayquil and using inhaler. Pt noticed wheezing with expiration. Caller states wife has been ill with bronchitis. See Provider w/i 4 hrs due to New onset or worsening cough and known cardiac or respiratory condition' per Cough Adult protocol. Protocol(s) Used: Cough - Adult Protocol(s) Used: Upper Respiratory Infection (URI) Recommended Outcome per Protocol: See Provider within 4 hours Reason for Outcome: Cough is the symptom causing most concern New or worsening cough AND known cardiac or respiratory condition Care Advice: ~ Continue all prescription medication as recommended until evaluated by provider. If home oxygen has been prescribed, apply it at the recommended flow rate; DO NOT increase the flow rate before consulting provider. ~If patient has had similar symptoms in past, relieved by provider's recommendations, follow those recommendations now. ~ Avoid any activity that produces symptoms until evaluated by provider. Call EMS 911 if sudden worsening of breathing problem, dusky or blue/gray color of lips, fingernail beds or skin; chest pain, weakness, or confusion occurs. ~Go to ED IMMEDIATELY if developing increased shortness of breath, continuous cough, worsening fatigue, or unable to perform ADLs. ~ SYMPTOM / CONDITION MANAGEMENT ~ IMMEDIATE ACTION Most adults need to drink 6-10 eight-ounce glasses (1.2-2.0 liters) of fluids per day unless previously told to limit fluid intake for other medical reasons. Limit  fluids that contain caffeine, sugar or alcohol. Urine will be a very light yellow color when you drink enough fluids.

## 2012-09-26 ENCOUNTER — Ambulatory Visit (INDEPENDENT_AMBULATORY_CARE_PROVIDER_SITE_OTHER): Payer: BC Managed Care – PPO | Admitting: Internal Medicine

## 2012-09-26 ENCOUNTER — Ambulatory Visit (INDEPENDENT_AMBULATORY_CARE_PROVIDER_SITE_OTHER)
Admission: RE | Admit: 2012-09-26 | Discharge: 2012-09-26 | Disposition: A | Discharge status: Home / Self Care | Payer: BC Managed Care – PPO | Source: Ambulatory Visit | Attending: Internal Medicine | Admitting: Internal Medicine

## 2012-09-26 ENCOUNTER — Encounter: Payer: Self-pay | Admitting: Internal Medicine

## 2012-09-26 VITALS — BP 130/84 | HR 83 | Temp 98.1°F | Resp 16 | Wt 196.8 lb

## 2012-09-26 DIAGNOSIS — J209 Acute bronchitis, unspecified: Secondary | ICD-10-CM

## 2012-09-26 DIAGNOSIS — R059 Cough, unspecified: Secondary | ICD-10-CM

## 2012-09-26 DIAGNOSIS — J42 Unspecified chronic bronchitis: Secondary | ICD-10-CM

## 2012-09-26 DIAGNOSIS — R05 Cough: Secondary | ICD-10-CM

## 2012-09-26 MED ORDER — HYDROCOD POLST-CPM POLST ER 10-8 MG PO CP12: 1.0000 | ORAL_CAPSULE | Freq: Two times a day (BID) | ORAL | Status: DC | PRN

## 2012-09-26 MED ORDER — CEFUROXIME AXETIL 500 MG PO TABS: 500.0000 mg | ORAL_TABLET | Freq: Two times a day (BID) | ORAL | Status: DC

## 2012-09-26 MED ORDER — LEVOFLOXACIN 500 MG PO TABS: 500.0000 mg | ORAL_TABLET | Freq: Every day | ORAL | Status: DC

## 2012-09-26 MED ORDER — METHYLPREDNISOLONE ACETATE 80 MG/ML IJ SUSP
120.0000 mg | Freq: Once | INTRAMUSCULAR | Status: AC
Start: 2012-09-26 — End: 2012-09-26
  Administered 2012-09-26: 120 mg via INTRAMUSCULAR

## 2012-09-26 MED ORDER — ACLIDINIUM BROMIDE 400 MCG/ACT IN AEPB: 1.0000 | INHALATION_SPRAY | Freq: Two times a day (BID) | RESPIRATORY_TRACT | Status: DC

## 2012-09-26 NOTE — Progress Notes (Signed)
Subjective:    Patient ID: Kevin Bradley, male    DOB: 05/19/1946, 67 y.o.   MRN: [id number]  Cough This is a new problem. The current episode started 1 to 4 weeks ago. The problem has been unchanged. The problem occurs every few hours. The cough is productive of purulent sputum. Associated symptoms include chills. Pertinent negatives include no chest pain, ear congestion, ear pain, fever, headaches, heartburn, hemoptysis, myalgias, nasal congestion, postnasal drip, rash, rhinorrhea, sore throat, shortness of breath, sweats, weight loss or wheezing. The symptoms are aggravated by cold air. He has tried ipratropium inhaler for the symptoms. The treatment provided mild relief. His past medical history is significant for bronchitis.      Review of Systems  Constitutional: Positive for chills. Negative for fever, weight loss, diaphoresis, activity change, appetite change, fatigue and unexpected weight change.  HENT: Negative.  Negative for ear pain, sore throat, rhinorrhea and postnasal drip.   Eyes: Negative.   Respiratory: Positive for cough. Negative for apnea, hemoptysis, choking, chest tightness, shortness of breath, wheezing and stridor.   Cardiovascular: Negative for chest pain, palpitations and leg swelling.  Gastrointestinal: Negative.  Negative for heartburn and abdominal pain.  Genitourinary: Negative.   Musculoskeletal: Negative.  Negative for myalgias.  Skin: Negative for color change, pallor, rash and wound.  Neurological: Negative.  Negative for headaches.  Hematological: Negative for adenopathy. Does not bruise/bleed easily.  Psychiatric/Behavioral: Negative.        Objective:   Physical Exam  Vitals reviewed. Constitutional: He is oriented to person, place, and time. He appears well-developed and well-nourished.  Non-toxic appearance. He does not have a sickly appearance. He does not appear ill. No distress.  HENT:  Head: Normocephalic and atraumatic.  Mouth/Throat:  Oropharynx is clear and moist. No oropharyngeal exudate.  Eyes: Conjunctivae normal are normal. Right eye exhibits no discharge. Left eye exhibits no discharge. No scleral icterus.  Neck: Normal range of motion. Neck supple. No JVD present. No tracheal deviation present. No thyromegaly present.  Cardiovascular: Normal rate, regular rhythm, normal heart sounds and intact distal pulses.  Exam reveals no gallop and no friction rub.   No murmur heard. Pulmonary/Chest: Effort normal and breath sounds normal. No stridor. No respiratory distress. He has no wheezes. He has no rales. He exhibits no tenderness.  Abdominal: Soft. Bowel sounds are normal. He exhibits no distension and no mass. There is no tenderness. There is no rebound and no guarding.  Musculoskeletal: Normal range of motion. He exhibits no edema and no tenderness.  Lymphadenopathy:    He has no cervical adenopathy.  Neurological: He is oriented to person, place, and time.  Skin: Skin is warm and dry. No rash noted. He is not diaphoretic. No erythema. No pallor.  Psychiatric: He has a normal mood and affect. His behavior is normal. Judgment and thought content normal.     Lab Results  Component Value Date   WBC 6.9 09/18/2011   HGB 17.8* 09/18/2011   HCT 51.2 09/18/2011   PLT 217.0 09/18/2011   GLUCOSE 103* 05/10/2012   CHOL 159 09/18/2011   TRIG 133.0 09/18/2011   HDL 31.80* 09/18/2011   LDLCALC 101* 09/18/2011   ALT 18 09/18/2011   AST 19 09/18/2011   NA 139 05/10/2012   K 3.9 05/10/2012   CL 104 05/10/2012   CREATININE 0.9 05/10/2012   BUN 21 05/10/2012   CO2 27 05/10/2012   TSH 1.36 04/01/2012   PSA 1.98 09/18/2011   HGBA1C 5.7 09/18/2011  Assessment & Plan:

## 2012-09-26 NOTE — Assessment & Plan Note (Signed)
I will check his CXR to see if he has PNA 

## 2012-09-26 NOTE — Patient Instructions (Signed)

## 2012-09-26 NOTE — Assessment & Plan Note (Signed)
He was given an injection of depo-medrol IM to reduce the inflammation He will restart tudorza Will treat the infection with ceftin and he will try tussicaps for the cough

## 2012-09-27 ENCOUNTER — Telehealth: Payer: Self-pay | Admitting: Internal Medicine

## 2012-09-27 NOTE — Telephone Encounter (Signed)
Patient calling for results of his chest x-ray

## 2012-09-28 NOTE — Telephone Encounter (Signed)
Returned call to [email or web address] advising No active cardiopulmonary disease

## 2012-10-17 ENCOUNTER — Telehealth: Payer: Self-pay

## 2012-10-17 ENCOUNTER — Other Ambulatory Visit (INDEPENDENT_AMBULATORY_CARE_PROVIDER_SITE_OTHER): Payer: BC Managed Care – PPO

## 2012-10-17 ENCOUNTER — Ambulatory Visit (INDEPENDENT_AMBULATORY_CARE_PROVIDER_SITE_OTHER): Payer: BC Managed Care – PPO | Admitting: Internal Medicine

## 2012-10-17 ENCOUNTER — Encounter: Payer: Self-pay | Admitting: Internal Medicine

## 2012-10-17 VITALS — BP 130/80 | HR 57 | Temp 97.6°F | Resp 16 | Wt 194.0 lb

## 2012-10-17 DIAGNOSIS — N400 Enlarged prostate without lower urinary tract symptoms: Secondary | ICD-10-CM

## 2012-10-17 DIAGNOSIS — I4891 Unspecified atrial fibrillation: Secondary | ICD-10-CM

## 2012-10-17 DIAGNOSIS — I10 Essential (primary) hypertension: Secondary | ICD-10-CM

## 2012-10-17 DIAGNOSIS — E278 Other specified disorders of adrenal gland: Secondary | ICD-10-CM

## 2012-10-17 DIAGNOSIS — N410 Acute prostatitis: Secondary | ICD-10-CM | POA: Insufficient documentation

## 2012-10-17 DIAGNOSIS — E039 Hypothyroidism, unspecified: Secondary | ICD-10-CM

## 2012-10-17 DIAGNOSIS — Z0000 Encounter for general adult medical examination without abnormal findings: Secondary | ICD-10-CM

## 2012-10-17 DIAGNOSIS — R7309 Other abnormal glucose: Secondary | ICD-10-CM

## 2012-10-17 LAB — URINALYSIS, ROUTINE W REFLEX MICROSCOPIC
Bilirubin Urine: NEGATIVE
Ketones, ur: NEGATIVE
Specific Gravity, Urine: 1.025 (ref 1.000–1.030)
Total Protein, Urine: NEGATIVE
Urine Glucose: NEGATIVE
pH: 6 (ref 5.0–8.0)

## 2012-10-17 LAB — LIPID PANEL
HDL: 38.6 mg/dL — ABNORMAL LOW (ref >39.00)
Triglycerides: 157 mg/dL — ABNORMAL HIGH (ref 0.0–149.0)

## 2012-10-17 LAB — CBC WITH DIFFERENTIAL/PLATELET
Basophils Absolute: 0 10*3/uL (ref 0.0–0.1)
Eosinophils Absolute: 0.1 10*3/uL (ref 0.0–0.7)
HCT: 55.9 % — ABNORMAL HIGH (ref 39.0–52.0)
Hemoglobin: 18.7 g/dL (ref 13.0–17.0)
Lymphocytes Relative: 18.8 % (ref 12.0–46.0)
Lymphs Abs: 1.5 10*3/uL (ref 0.7–4.0)
MCHC: 33.5 g/dL (ref 30.0–36.0)
MCV: 93.2 fl (ref 78.0–100.0)
Monocytes Absolute: 0.7 10*3/uL (ref 0.1–1.0)
Neutro Abs: 5.6 10*3/uL (ref 1.4–7.7)
RDW: 13.4 % (ref 11.5–14.6)

## 2012-10-17 LAB — COMPREHENSIVE METABOLIC PANEL
ALT: 20 U/L (ref 0–53)
AST: 15 U/L (ref 0–37)
Alkaline Phosphatase: 55 U/L (ref 39–117)
Calcium: 8.8 mg/dL (ref 8.4–10.5)
Chloride: 102 mEq/L (ref 96–112)
Creatinine, Ser: 0.9 mg/dL (ref 0.4–1.5)

## 2012-10-17 LAB — CORTISOL: Cortisol, Plasma: 9.7 ug/dL

## 2012-10-17 MED ORDER — OLMESARTAN MEDOXOMIL-HCTZ 40-12.5 MG PO TABS: 1.0000 | ORAL_TABLET | Freq: Every day | ORAL | Status: DC

## 2012-10-17 MED ORDER — DILTIAZEM HCL ER BEADS 180 MG PO CP24: 180.0000 mg | ORAL_CAPSULE | Freq: Every day | ORAL | Status: DC

## 2012-10-17 MED ORDER — LEVOTHYROXINE SODIUM 50 MCG PO TABS: 50.0000 ug | ORAL_TABLET | Freq: Every day | ORAL | Status: DC

## 2012-10-17 MED ORDER — CIPROFLOXACIN HCL 500 MG PO TABS: 500.0000 mg | ORAL_TABLET | Freq: Two times a day (BID) | ORAL | Status: DC

## 2012-10-17 MED ORDER — TAMSULOSIN HCL 0.4 MG PO CAPS: 0.4000 mg | ORAL_CAPSULE | Freq: Every day | ORAL | Status: DC

## 2012-10-17 NOTE — Assessment & Plan Note (Signed)
His cortisol level remains normal

## 2012-10-17 NOTE — Progress Notes (Signed)
Subjective:    Patient ID: Kevin Bradley, male    DOB: [birth year], 67 y.o.   MRN: [id number]  Benign Prostatic Hypertrophy This is a recurrent problem. The problem has been gradually worsening (worsening s/s over the last 5 days) since onset. Irritative symptoms include frequency, nocturia and urgency. Obstructive symptoms include dribbling, incomplete emptying, a slower stream, straining and a weak stream. Obstructive symptoms do not include an intermittent stream. Associated symptoms include hesitancy. Pertinent negatives include no chills, dysuria, hematuria, nausea or vomiting. AUA score is 8-19. He is sexually active. Nothing aggravates the symptoms. Past treatments include nothing.      Review of Systems  Constitutional: Negative for fever, chills, diaphoresis, activity change, appetite change, fatigue and unexpected weight change.  HENT: Negative.   Eyes: Negative.   Respiratory: Negative for cough, chest tightness, shortness of breath, wheezing and stridor.   Cardiovascular: Negative for chest pain, palpitations and leg swelling.  Gastrointestinal: Negative for nausea, vomiting, abdominal pain, diarrhea, constipation and abdominal distention.  Endocrine: Negative.   Genitourinary: Positive for hesitancy, urgency, frequency, incomplete emptying and nocturia. Negative for dysuria, hematuria, flank pain, decreased urine volume, discharge, penile swelling, scrotal swelling, enuresis, difficulty urinating, genital sores, penile pain and testicular pain.  Musculoskeletal: Negative for myalgias, back pain, joint swelling, arthralgias and gait problem.  Skin: Negative for color change, pallor, rash and wound.  Allergic/Immunologic: Negative.   Neurological: Negative.  Negative for dizziness, tremors, weakness, light-headedness and numbness.  Hematological: Negative for adenopathy. Does not bruise/bleed easily.  Psychiatric/Behavioral: Negative.        Objective:   Physical Exam   Vitals reviewed. Constitutional: He is oriented to person, place, and time. He appears well-developed and well-nourished. No distress.  HENT:  Head: Normocephalic and atraumatic.  Mouth/Throat: Oropharynx is clear and moist. No oropharyngeal exudate.  Eyes: Conjunctivae are normal. Right eye exhibits no discharge. Left eye exhibits no discharge. No scleral icterus.  Neck: Normal range of motion. Neck supple. No JVD present. No tracheal deviation present. No thyromegaly present.  Cardiovascular: Normal rate, regular rhythm, normal heart sounds and intact distal pulses.  Exam reveals no gallop and no friction rub.   No murmur heard. Pulmonary/Chest: Effort normal and breath sounds normal. No stridor. No respiratory distress. He has no wheezes. He has no rales. He exhibits no tenderness.  Abdominal: Soft. Bowel sounds are normal. He exhibits no distension and no mass. There is no tenderness. There is no rebound and no guarding. Hernia confirmed negative in the right inguinal area and confirmed negative in the left inguinal area.  Genitourinary: Rectum normal, testes normal and penis normal. Rectal exam shows no external hemorrhoid, no internal hemorrhoid, no fissure, no mass, no tenderness and anal tone normal. Guaiac negative stool. Prostate is enlarged (2+ BPH right lobe sl larger than left lobe, + boggy). Prostate is not tender. Right testis shows no mass, no swelling and no tenderness. Right testis is descended. Left testis shows no mass, no swelling and no tenderness. Left testis is descended. Uncircumcised. No phimosis, paraphimosis, hypospadias, penile erythema or penile tenderness. No discharge found.  Musculoskeletal: Normal range of motion. He exhibits no edema and no tenderness.  Lymphadenopathy:    He has no cervical adenopathy.       Right: No inguinal adenopathy present.       Left: No inguinal adenopathy present.  Neurological: He is oriented to person, place, and time.  Skin: Skin is  warm and dry. No rash noted. He is not diaphoretic.  No erythema. No pallor.  Psychiatric: He has a normal mood and affect. His behavior is normal. Judgment and thought content normal.     Lab Results  Component Value Date   WBC 7.9 10/17/2012   HGB 18.7 Repeated and verified X2.* 10/17/2012   HCT 55.9* 10/17/2012   PLT 203.0 10/17/2012   GLUCOSE 91 10/17/2012   CHOL 177 10/17/2012   TRIG 157.0* 10/17/2012   HDL 38.60* 10/17/2012   LDLCALC 107* 10/17/2012   ALT 20 10/17/2012   AST 15 10/17/2012   NA 139 10/17/2012   K 4.2 10/17/2012   CL 102 10/17/2012   CREATININE 0.9 10/17/2012   BUN 21 10/17/2012   CO2 31 10/17/2012   TSH 2.76 10/17/2012   PSA 2.29 10/17/2012   HGBA1C 5.9 10/17/2012       Assessment & Plan:

## 2012-10-17 NOTE — Assessment & Plan Note (Signed)
Start flomax Treat with cipro

## 2012-10-17 NOTE — Telephone Encounter (Signed)
[facility] called to result a critical Hgb of 18.7. Thanks

## 2012-10-17 NOTE — Assessment & Plan Note (Addendum)

## 2012-10-17 NOTE — Patient Instructions (Addendum)
Health Maintenance, Males A healthy lifestyle and preventative care can promote health and wellness.  Maintain regular health, dental, and eye exams.  Eat a healthy diet. Foods like vegetables, fruits, whole grains, low-fat dairy products, and lean protein foods contain the nutrients you need without too many calories. Decrease your intake of foods high in solid fats, added sugars, and salt. Get information about a proper diet from your caregiver, if necessary.  Regular physical exercise is one of the most important things you can do for your health. Most adults should get at least 150 minutes of moderate-intensity exercise (any activity that increases your heart rate and causes you to sweat) each week. In addition, most adults need muscle-strengthening exercises on 2 or more days a week.   Maintain a healthy weight. The body mass index (BMI) is a screening tool to identify possible weight problems. It provides an estimate of body fat based on height and weight. Your caregiver can help determine your BMI, and can help you achieve or maintain a healthy weight. For adults 20 years and older:  A BMI below 18.5 is considered underweight.  A BMI of 18.5 to 24.9 is normal.  A BMI of 25 to 29.9 is considered overweight.  A BMI of 30 and above is considered obese.  Maintain normal blood lipids and cholesterol by exercising and minimizing your intake of saturated fat. Eat a balanced diet with plenty of fruits and vegetables. Blood tests for lipids and cholesterol should begin at age 20 and be repeated every 5 years. If your lipid or cholesterol levels are high, you are over 50, or you are a high risk for heart disease, you may need your cholesterol levels checked more frequently.Ongoing high lipid and cholesterol levels should be treated with medicines, if diet and exercise are not effective.  If you smoke, find out from your caregiver how to quit. If you do not use tobacco, do not start.  If you  choose to drink alcohol, do not exceed 2 drinks per day. One drink is considered to be 12 ounces (355 mL) of beer, 5 ounces (148 mL) of wine, or 1.5 ounces (44 mL) of liquor.  Avoid use of street drugs. Do not share needles with anyone. Ask for help if you need support or instructions about stopping the use of drugs.  High blood pressure causes heart disease and increases the risk of stroke. Blood pressure should be checked at least every 1 to 2 years. Ongoing high blood pressure should be treated with medicines if weight loss and exercise are not effective.  If you are 45 to 68 years old, ask your caregiver if you should take aspirin to prevent heart disease.  Diabetes screening involves taking a blood sample to check your fasting blood sugar level. This should be done once every 3 years, after age 45, if you are within normal weight and without risk factors for diabetes. Testing should be considered at a younger age or be carried out more frequently if you are overweight and have at least 1 risk factor for diabetes.  Colorectal cancer can be detected and often prevented. Most routine colorectal cancer screening begins at the age of 50 and continues through age 75. However, your caregiver may recommend screening at an earlier age if you have risk factors for colon cancer. On a yearly basis, your caregiver may provide home test kits to check for hidden blood in the stool. Use of a small camera at the end of a tube,   to directly examine the colon (sigmoidoscopy or colonoscopy), can detect the earliest forms of colorectal cancer. Talk to your caregiver about this at age 70, when routine screening begins. Direct examination of the colon should be repeated every 5 to 10 years through age 58, unless early forms of pre-cancerous polyps or small growths are found.  Hepatitis C blood testing is recommended for all people born from [birth year] through 1966 and any individual with known risks for hepatitis C.  Healthy  men should no longer receive prostate-specific antigen (PSA) blood tests as part of routine cancer screening. Consult with your caregiver about prostate cancer screening.  Testicular cancer screening is not recommended for adolescents or adult males who have no symptoms. Screening includes self-exam, caregiver exam, and other screening tests. Consult with your caregiver about any symptoms you have or any concerns you have about testicular cancer.  Practice safe sex. Use condoms and avoid high-risk sexual practices to reduce the spread of sexually transmitted infections (STIs).  Use sunscreen with a sun protection factor (SPF) of 30 or greater. Apply sunscreen liberally and repeatedly throughout the day. You should seek shade when your shadow is shorter than you. Protect yourself by wearing long sleeves, pants, a wide-brimmed hat, and sunglasses year round, whenever you are outdoors.  Notify your caregiver of new moles or changes in moles, especially if there is a change in shape or color. Also notify your caregiver if a mole is larger than the size of a pencil eraser.  A one-time screening for abdominal aortic aneurysm (AAA) and surgical repair of large AAAs by sound wave imaging (ultrasonography) is recommended for ages 1 to 13 years who are current or former smokers.  Stay current with your immunizations. Document Released: 02/20/2008 Document Revised: 11/16/2011 Document Reviewed: 01/19/2011 [facility] Patient Information 2013 [place], [place]. Prostatitis The prostate gland is about the size and shape of a walnut. It is located just below your bladder. It produces one of the components of semen, which is made up of sperm and the fluids that help nourish and transport it out from the testicles. Prostatitis is redness, soreness, and swelling (inflammation) of the prostate gland.  There are 3 types of prostatitis:  Acute bacterial prostatitis This is the least common type of prostatitis. It starts  quickly and usually leads to a bladder infection. It can occur at any age.  Chronic bacterial prostatitis This is a persistent bacterial infection in the prostate.It usually develops from repeated acute bacterial prostatitis or acute bacterial prostatitis that was not properly treated. It can occur in men of any age but is most common in middle-aged men whose prostate has begun to enlarge.  Chronic prostatitis chronic pelvic pain syndrome This is the most common type of prostatitis. It is inflammation of the prostate gland that is not caused by a bacterial infection. The cause is unknown. CAUSES The cause of acute and chronic bacterial prostatitis is a bacterial infection. The exact cause of chronic prostatitis and chronic pelvic pain syndrome and asymptomatic inflammatory prostatitis is unknown.  SYMPTOMS  Symptoms can vary depending upon the type of prostatitis that exists. There can also be overlap in symptoms. Possible symptoms for each type of prostatitis are listed below. Acute bacterial prostatitis  Painful urination.  Fever or chills.  Muscle or joint pains.  Low back pain.  Low abdominal pain.  Inability to empty bladder completely.  Sudden urge to urinate.  Frequent urination.  Difficulty starting urine stream.  Weak urine stream.  Discharge from the  urethra.  Dribbling after urination.  Rectal pain.  Pain in the testicles, penis, or tip of the penis.  Pain in the space between the anus and scrotum (perineum).  Problems with sexual function.  Painful ejaculation.  Bloody semen. Chronic bacterial prostatitis  The symptoms are similar to those of acute bacterial prostatitis, but they usually are much less severe. Fever, chills, and muscle and joint pain are not associated with chronic bacterial prostatitis. Chronic prostatitis chronic pelvic pain syndrome  Symptoms typically include a dull ache in the scrotum and the perineum. DIAGNOSIS  In order to  diagnose prostatitis, your caregiver will ask about your symptoms. If acute or chronic bacterial prostatitis is suspected, a urine sample will be taken and tested (urinalysis). This is to see if there is bacteria in your urine. If the urinalysis result is negative for bacteria, your caregiver may use a finger to feel your prostate (digital rectal exam). This exam helps your caregiver determine if your prostate is swollen and tender. TREATMENT  Treatment for prostatitis depends on the cause. If a bacterial infection is the cause, it can be treated with antibiotic medicine. In cases of chronic bacterial prostatitis, the use of antibiotics for up to 1 month may be necessary. Your caregiver may instruct you to take sitz baths to help relieve pain. A sitz bath is a bath of hot water in which your hips and buttocks are under water. HOME CARE INSTRUCTIONS   Take all medicines as directed by your caregiver.  Take sitz baths as directed by your caregiver. SEEK MEDICAL CARE IF:   Your symptoms get worse, not better.  You have a fever. SEEK IMMEDIATE MEDICAL CARE IF:   You have chills.  You feel nauseous or vomit.  You feel lightheaded or faint.  You are unable to urinate.  You have blood or blood clots in your urine. Document Released: 08/21/2000 Document Revised: 11/16/2011 Document Reviewed: 07/27/2011 [facility] Patient Information 2013 [place], [place].

## 2012-10-17 NOTE — Assessment & Plan Note (Signed)
Start flomax Check his PSA today

## 2012-10-17 NOTE — Assessment & Plan Note (Signed)
I will check his TSh and will adjust his dose if needed

## 2012-10-17 NOTE — Assessment & Plan Note (Signed)
His BP is well controlled Today I will check his lytes and renal function 

## 2012-10-17 NOTE — Assessment & Plan Note (Signed)
I will check his A1C to see if he has developed DM II 

## 2012-10-18 ENCOUNTER — Other Ambulatory Visit: Payer: Self-pay | Admitting: Internal Medicine

## 2012-10-21 ENCOUNTER — Other Ambulatory Visit: Payer: Self-pay | Admitting: Cardiology

## 2012-10-25 ENCOUNTER — Other Ambulatory Visit: Payer: Self-pay | Admitting: Internal Medicine

## 2012-10-26 ENCOUNTER — Telehealth: Payer: Self-pay | Admitting: Cardiology

## 2012-10-26 MED ORDER — FLECAINIDE ACETATE 50 MG PO TABS: ORAL_TABLET | ORAL | Status: DC

## 2012-10-26 NOTE — Telephone Encounter (Signed)
Refill Request    Flecainide 50 mg

## 2012-12-18 ENCOUNTER — Other Ambulatory Visit: Payer: Self-pay | Admitting: Cardiology

## 2012-12-27 ENCOUNTER — Ambulatory Visit (INDEPENDENT_AMBULATORY_CARE_PROVIDER_SITE_OTHER): Payer: BC Managed Care – PPO | Admitting: Cardiology

## 2012-12-27 ENCOUNTER — Encounter: Payer: Self-pay | Admitting: Cardiology

## 2012-12-27 VITALS — BP 150/84 | HR 60 | Ht 72.0 in | Wt 195.0 lb

## 2012-12-27 DIAGNOSIS — R0989 Other specified symptoms and signs involving the circulatory and respiratory systems: Secondary | ICD-10-CM

## 2012-12-27 DIAGNOSIS — I4891 Unspecified atrial fibrillation: Secondary | ICD-10-CM

## 2012-12-27 DIAGNOSIS — I10 Essential (primary) hypertension: Secondary | ICD-10-CM

## 2012-12-27 MED ORDER — NEBIVOLOL HCL 2.5 MG PO TABS: 2.5000 mg | ORAL_TABLET | Freq: Every day | ORAL | Status: DC

## 2012-12-27 MED ORDER — FLECAINIDE ACETATE 50 MG PO TABS: ORAL_TABLET | ORAL | Status: DC

## 2012-12-27 NOTE — Assessment & Plan Note (Signed)
Blood pressure mildly elevated. Add bystolic 2.5 mg daily both for blood pressure and palpitations.

## 2012-12-27 NOTE — Assessment & Plan Note (Signed)
Plan schedule abdominal ultrasound to exclude aneurysm.

## 2012-12-27 NOTE — Patient Instructions (Addendum)
Your physician wants you to follow-up in: ONE YEAR WITH DR Shelda Pal will receive a reminder letter in the mail two months in advance. If you don't receive a letter, please call our office to schedule the follow-up appointment.   START BYSTOLIC 2.5 MG ONCE DAILY  Your physician has requested that you have an abdominal aorta duplex. During this test, an ultrasound is used to evaluate the aorta. Allow 30 minutes for this exam. Do not eat after midnight the day before and avoid carbonated beverages

## 2012-12-27 NOTE — Progress Notes (Signed)
HPI: Pleasant gentleman with PMH atrial fibrillation for f/iu. Note TSH in March of 2011 was normal at 1.56. A CardioNet monitor in February revealed sinus rhythm with occasional bursts of atrial fibrillation. He had an episode of chest pain approximately 9 years ago and had a cardiac catheterization that was normal by his report perfomed in [place]. An echocardiogram in May of 2011 revealed normal LV function, trivial aortic insufficiency and mild left atrial enlargement. Patient placed on flecanide previously. A followup stress echocardiogram showed no ischemia and normal LV function. There was a question of septal hypokinesis both at rest and with stress. There was no exercise induced arrhythmias. Cardionet in Aug 2012 showed sinus with PAT but no VT. Previously declined anticoagulants. Since I last saw him in April 2013, there is no dyspnea or chest pain. He continues to have occasional palpitations but not sustained; no syncope.   Current Outpatient Prescriptions  Medication Sig Dispense Refill  . aspirin EC 325 MG tablet Take 325 mg by mouth daily.        Marland Kitchen BENICAR HCT 40-12.5 MG per tablet TAKE 1 TABLET BY MOUTH DAILY  90 tablet  0  . Fish Oil OIL Take 1 tablet by mouth daily.        . flecainide (TAMBOCOR) 50 MG tablet TAKE 2 TABLETS BY MOUTH DAILY  180 tablet  4  . levothyroxine (SYNTHROID) 50 MCG tablet Take 1 tablet (50 mcg total) by mouth daily.  90 tablet  1  . olmesartan-hydrochlorothiazide (BENICAR HCT) 40-12.5 MG per tablet Take 1 tablet by mouth daily.  90 tablet  3  . potassium chloride SA (K-DUR,KLOR-CON) 20 MEQ tablet TAKE 1 TABLET BY MOUTH TWICE DAILY  180 tablet  0  . TAZTIA XT 180 MG 24 hr capsule TAKE ONE CAPSULE BY MOUTH DAILY  90 capsule  3  . VITAMIN D, CHOLECALCIFEROL, PO Take 2 tablets by mouth daily.        . nebivolol (BYSTOLIC) 2.5 MG tablet Take 1 tablet (2.5 mg total) by mouth daily.  90 tablet  4   No current facility-administered medications for this visit.      Past Medical History  Diagnosis Date  . Atrial fibrillation   . HTN (hypertension)   . External thrombosed hemorrhoids   . Hx of colonic polyps   . BPH (benign prostatic hypertrophy)   . Hypothyroidism   . Anxiety   . Low HDL (under 40)   . Nephrolithiasis   . History of colonoscopy     Past Surgical History  Procedure Laterality Date  . Inguinal hernia repair    . Tonsillectomy    . Thyroidectomy, partial    . Appendectomy      History   Social History  . Marital Status: Married    Spouse Name: N/A    Number of Children: N/A  . 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 Smoking status: Former Smoker    Types: Cigars    Quit date: 10/08/2010  . Smokeless tobacco: Never Used  . Alcohol Use: No  . Drug Use: No  . Sexually Active: Yes   Other Topics Concern  . Not on file   Social History Narrative  . No narrative on file    ROS: no fevers or chills, productive cough, hemoptysis, dysphasia, odynophagia, melena, hematochezia, dysuria, hematuria, rash, seizure activity, orthopnea, PND, pedal edema, claudication. Remaining systems are negative.  Physical Exam: Well-developed well-nourished in no acute distress.  Skin is warm and dry.  HEENT is normal.  Neck is supple.  Chest is clear to auscultation with normal expansion.  Cardiovascular exam is regular rate and rhythm.  Abdominal exam nontender or distended. No masses palpated. Bruit noted Extremities show no edema. neuro grossly intact  ECG sinus rhythm at a rate of 60. Nonspecific ST changes.

## 2012-12-27 NOTE — Assessment & Plan Note (Signed)
Patient remains in sinus rhythm. He has occasional palpitations lasting less than 1 minute that he feels is similar to previous atrial fibrillation. However they are much less frequent compared to when he was off of flecainide. We can increase the dose in the future if needed. Continue aspirin. He declines Coumadin or new oral anticoagulant agents. He understands the higher risk of CVA.  

## 2013-01-16 ENCOUNTER — Encounter (INDEPENDENT_AMBULATORY_CARE_PROVIDER_SITE_OTHER): Payer: BC Managed Care – PPO

## 2013-01-16 DIAGNOSIS — I4891 Unspecified atrial fibrillation: Secondary | ICD-10-CM

## 2013-01-16 DIAGNOSIS — R0989 Other specified symptoms and signs involving the circulatory and respiratory systems: Secondary | ICD-10-CM

## 2013-02-02 ENCOUNTER — Ambulatory Visit (INDEPENDENT_AMBULATORY_CARE_PROVIDER_SITE_OTHER): Payer: BC Managed Care – PPO | Admitting: Internal Medicine

## 2013-02-02 ENCOUNTER — Encounter: Payer: Self-pay | Admitting: Internal Medicine

## 2013-02-02 VITALS — BP 122/82 | HR 54 | Temp 98.5°F | Resp 16 | Wt 198.0 lb

## 2013-02-02 DIAGNOSIS — L255 Unspecified contact dermatitis due to plants, except food: Secondary | ICD-10-CM

## 2013-02-02 DIAGNOSIS — L247 Irritant contact dermatitis due to plants, except food: Secondary | ICD-10-CM | POA: Insufficient documentation

## 2013-02-02 MED ORDER — METHYLPREDNISOLONE ACETATE 80 MG/ML IJ SUSP
80.0000 mg | Freq: Once | INTRAMUSCULAR | Status: AC
  Administered 2013-02-02: 80 mg via INTRAMUSCULAR

## 2013-02-02 MED ORDER — FLUOCINONIDE-E 0.05 % EX CREA: TOPICAL_CREAM | Freq: Two times a day (BID) | CUTANEOUS | Status: DC

## 2013-02-02 MED ORDER — HYDROXYZINE HCL 25 MG PO TABS: 25.0000 mg | ORAL_TABLET | Freq: Three times a day (TID) | ORAL | Status: DC | PRN

## 2013-02-02 MED ORDER — METHYLPREDNISOLONE ACETATE 40 MG/ML IJ SUSP
40.0000 mg | Freq: Once | INTRAMUSCULAR | Status: AC
  Administered 2013-02-02: 40 mg via INTRAMUSCULAR

## 2013-02-02 NOTE — Assessment & Plan Note (Signed)
Will treat with an injection of depo-medrol IM, lidex cream, and hydroxyzine as needed

## 2013-02-02 NOTE — Patient Instructions (Signed)
Poison Ivy Poison ivy is a inflammation of the skin (contact dermatitis) caused by touching the allergens on the leaves of the ivy plant following previous exposure to the plant. The rash usually appears 48 hours after exposure. The rash is usually bumps (papules) or blisters (vesicles) in a linear pattern. Depending on your own sensitivity, the rash may simply cause redness and itching, or it may also progress to blisters which may break open. These must be well cared for to prevent secondary bacterial (germ) infection, followed by scarring. Keep any open areas dry, clean, dressed, and covered with an antibacterial ointment if needed. The eyes may also get puffy. The puffiness is worst in the morning and gets better as the day progresses. This dermatitis usually heals without scarring, within 2 to 3 weeks without treatment. HOME CARE INSTRUCTIONS  Thoroughly wash with soap and water as soon as you have been exposed to poison ivy. You have about one half hour to remove the plant resin before it will cause the rash. This washing will destroy the oil or antigen on the skin that is causing, or will cause, the rash. Be sure to wash under your fingernails as any plant resin there will continue to spread the rash. Do not rub skin vigorously when washing affected area. Poison ivy cannot spread if no oil from the plant remains on your body. A rash that has progressed to weeping sores will not spread the rash unless you have not washed thoroughly. It is also important to wash any clothes you have been wearing as these may carry active allergens. The rash will return if you wear the unwashed clothing, even several days later. Avoidance of the plant in the future is the best measure. Poison ivy plant can be recognized by the number of leaves. Generally, poison ivy has three leaves with flowering branches on a single stem. Diphenhydramine may be purchased over the counter and used as needed for itching. Do not drive with  this medication if it makes you drowsy.Ask your caregiver about medication for children. SEEK MEDICAL CARE IF:  Open sores develop.  Redness spreads beyond area of rash.  You notice purulent (pus-like) discharge.  You have increased pain.  Other signs of infection develop (such as fever). Document Released: 08/21/2000 Document Revised: 11/16/2011 Document Reviewed: 07/10/2009 [employer] Patient Information 2014 [employer].  

## 2013-02-02 NOTE — Progress Notes (Signed)
Subjective:    Patient ID: Kevin Bradley, male    DOB: 1946-02-18, 67 y.o.   MRN: [id number]  Rash This is a new problem. The current episode started in the past 7 days. The problem has been gradually worsening since onset. The affected locations include the face, groin, left arm and right arm. The rash is characterized by itchiness and scaling. He was exposed to plant contact. Pertinent negatives include no anorexia, congestion, cough, diarrhea, eye pain, facial edema, fatigue, fever, joint pain, nail changes, rhinorrhea, shortness of breath, sore throat or vomiting. Past treatments include nothing. The treatment provided no relief. There is no history of allergies, asthma, eczema or varicella.      Review of Systems  Constitutional: Negative.  Negative for fever and fatigue.  HENT: Negative.  Negative for congestion, sore throat and rhinorrhea.   Eyes: Negative.  Negative for pain.  Respiratory: Negative.  Negative for cough and shortness of breath.   Cardiovascular: Negative.   Gastrointestinal: Negative.  Negative for vomiting, abdominal pain, diarrhea and anorexia.  Endocrine: Negative.   Genitourinary: Negative.  Negative for difficulty urinating.  Musculoskeletal: Negative.  Negative for joint pain.  Skin: Positive for rash. Negative for nail changes, color change, pallor and wound.  Allergic/Immunologic: Positive for environmental allergies. Negative for food allergies and immunocompromised state.  Neurological: Negative.   Hematological: Negative.   Psychiatric/Behavioral: Negative.        Objective:   Physical Exam  Vitals reviewed. Constitutional: He is oriented to person, place, and time. He appears well-developed and well-nourished. No distress.  HENT:  Head: Normocephalic and atraumatic.  Mouth/Throat: Oropharynx is clear and moist. No oropharyngeal exudate.  Eyes: Conjunctivae are normal. Right eye exhibits no discharge. Left eye exhibits no discharge. No scleral  icterus.  Neck: Normal range of motion. Neck supple. No JVD present. No tracheal deviation present. No thyromegaly present.  Cardiovascular: Normal rate, regular rhythm, normal heart sounds and intact distal pulses.  Exam reveals no gallop and no friction rub.   No murmur heard. Pulmonary/Chest: Effort normal and breath sounds normal. No stridor. No respiratory distress. He has no wheezes. He has no rales. He exhibits no tenderness.  Abdominal: Soft. Bowel sounds are normal. He exhibits no distension and no mass. There is no tenderness. There is no rebound and no guarding.  Musculoskeletal: Normal range of motion. He exhibits no edema and no tenderness.  Lymphadenopathy:    He has no cervical adenopathy.  Neurological: He is oriented to person, place, and time.  Skin: Skin is warm, dry and intact. Rash noted. No abrasion, no bruising, no burn, no ecchymosis, no laceration, no lesion, no petechiae and no purpura noted. Rash is papular. Rash is not macular, not maculopapular, not nodular, not pustular, not vesicular and not urticarial. He is not diaphoretic. No cyanosis or erythema. No pallor. Nails show no clubbing.  He has classic linear streaks of erythematous scaly papules on his face, forearms, and upper thighs, groin area  Psychiatric: He has a normal mood and affect. His behavior is normal. Judgment and thought content normal.     Lab Results  Component Value Date   WBC 7.9 10/17/2012   HGB 18.7 Repeated and verified X2.* 10/17/2012   HCT 55.9* 10/17/2012   PLT 203.0 10/17/2012   GLUCOSE 91 10/17/2012   CHOL 177 10/17/2012   TRIG 157.0* 10/17/2012   HDL 38.60* 10/17/2012   LDLCALC 107* 10/17/2012   ALT 20 10/17/2012   AST 15 10/17/2012  NA 139 10/17/2012   K 4.2 10/17/2012   CL 102 10/17/2012   CREATININE 0.9 10/17/2012   BUN 21 10/17/2012   CO2 31 10/17/2012   TSH 2.76 10/17/2012   PSA 2.29 10/17/2012   HGBA1C 5.9 10/17/2012       Assessment & Plan:

## 2013-03-02 ENCOUNTER — Encounter: Payer: Self-pay | Admitting: Internal Medicine

## 2013-03-02 ENCOUNTER — Ambulatory Visit (INDEPENDENT_AMBULATORY_CARE_PROVIDER_SITE_OTHER): Payer: BC Managed Care – PPO | Admitting: Internal Medicine

## 2013-03-02 VITALS — BP 130/80 | HR 60 | Temp 97.6°F | Resp 16 | Wt 192.0 lb

## 2013-03-02 DIAGNOSIS — J069 Acute upper respiratory infection, unspecified: Secondary | ICD-10-CM

## 2013-03-02 DIAGNOSIS — I10 Essential (primary) hypertension: Secondary | ICD-10-CM

## 2013-03-02 MED ORDER — HYDROCODONE-HOMATROPINE 5-1.5 MG/5ML PO SYRP: 5.0000 mL | ORAL_SOLUTION | Freq: Three times a day (TID) | ORAL | Status: DC | PRN

## 2013-03-02 NOTE — Progress Notes (Signed)
  Subjective:    Patient ID: Kevin Bradley, male    DOB: Mar 24, 1946, 67 y.o.   MRN: [id number]  URI  This is a new problem. The current episode started in the past 7 days. The problem has been unchanged. The maximum temperature recorded prior to his arrival was 100 - 100.9 F. The fever has been present for 1 to 2 days. Associated symptoms include congestion, coughing (NP), headaches, rhinorrhea and a sore throat. Pertinent negatives include no abdominal pain, chest pain, diarrhea, dysuria, ear pain, joint pain, joint swelling, nausea, neck pain, plugged ear sensation, rash, sinus pain, sneezing, swollen glands, vomiting or wheezing. He has tried nothing for the symptoms. The treatment provided no relief.      Review of Systems  Constitutional: Positive for fever, chills and fatigue. Negative for diaphoresis, activity change and appetite change.  HENT: Positive for congestion, sore throat and rhinorrhea. Negative for ear pain, nosebleeds, facial swelling, sneezing, trouble swallowing, neck pain and sinus pressure.   Respiratory: Positive for cough (NP). Negative for wheezing.   Cardiovascular: Negative for chest pain, palpitations and leg swelling.  Gastrointestinal: Negative.  Negative for nausea, vomiting, abdominal pain and diarrhea.  Endocrine: Negative.   Genitourinary: Negative.  Negative for dysuria.  Musculoskeletal: Positive for myalgias. Negative for joint pain, joint swelling, arthralgias and gait problem.  Skin: Negative for color change, pallor, rash and wound.  Allergic/Immunologic: Negative.   Neurological: Positive for headaches.  Hematological: Negative for adenopathy. Does not bruise/bleed easily.  Psychiatric/Behavioral: Negative.        Objective:   Physical Exam  Vitals reviewed. Constitutional: He is oriented to person, place, and time. He appears well-developed and well-nourished.  Non-toxic appearance. He does not have a sickly appearance. He does not appear ill.  No distress.  HENT:  Head: Normocephalic and atraumatic.  Right Ear: Hearing, tympanic membrane, external ear and ear canal normal.  Left Ear: Hearing, tympanic membrane, external ear and ear canal normal.  Nose: Mucosal edema present. No rhinorrhea or sinus tenderness. No epistaxis. Right sinus exhibits no maxillary sinus tenderness and no frontal sinus tenderness. Left sinus exhibits no maxillary sinus tenderness and no frontal sinus tenderness.  Mouth/Throat: Mucous membranes are not pale, not dry and not cyanotic. Posterior oropharyngeal erythema present. No oropharyngeal exudate, posterior oropharyngeal edema or tonsillar abscesses.  Eyes: Conjunctivae are normal. Right eye exhibits no discharge. Left eye exhibits no discharge. No scleral icterus.  Neck: Normal range of motion. Neck supple. No JVD present. No tracheal deviation present. No thyromegaly present.  Cardiovascular: Normal rate, regular rhythm, normal heart sounds and intact distal pulses.  Exam reveals no gallop and no friction rub.   No murmur heard. Pulmonary/Chest: Effort normal and breath sounds normal. No stridor. No respiratory distress. He has no wheezes. He has no rales. He exhibits no tenderness.  Abdominal: Soft. Bowel sounds are normal. He exhibits no distension and no mass. There is no tenderness. There is no rebound and no guarding.  Musculoskeletal: Normal range of motion. He exhibits no edema and no tenderness.  Lymphadenopathy:    He has no cervical adenopathy.  Neurological: He is oriented to person, place, and time.  Skin: Skin is warm and dry. No rash noted. He is not diaphoretic. No erythema. No pallor.  Psychiatric: He has a normal mood and affect. His behavior is normal. Judgment and thought content normal.          Assessment & Plan:

## 2013-03-02 NOTE — Assessment & Plan Note (Signed)
This is viral so antibiotics are not indicated He will take a cough suppressant and will get a lot of rest

## 2013-03-02 NOTE — Patient Instructions (Signed)

## 2013-03-02 NOTE — Assessment & Plan Note (Signed)
His BP is well controlled 

## 2013-03-04 ENCOUNTER — Other Ambulatory Visit: Payer: Self-pay | Admitting: Cardiology

## 2013-03-20 ENCOUNTER — Other Ambulatory Visit: Payer: Self-pay | Admitting: Internal Medicine

## 2013-04-10 ENCOUNTER — Ambulatory Visit (INDEPENDENT_AMBULATORY_CARE_PROVIDER_SITE_OTHER): Payer: BC Managed Care – PPO | Admitting: Internal Medicine

## 2013-04-10 ENCOUNTER — Encounter: Payer: Self-pay | Admitting: Internal Medicine

## 2013-04-10 VITALS — BP 128/84 | HR 61 | Temp 97.8°F | Resp 16 | Wt 194.0 lb

## 2013-04-10 DIAGNOSIS — R059 Cough, unspecified: Secondary | ICD-10-CM

## 2013-04-10 DIAGNOSIS — J42 Unspecified chronic bronchitis: Secondary | ICD-10-CM

## 2013-04-10 DIAGNOSIS — J209 Acute bronchitis, unspecified: Secondary | ICD-10-CM

## 2013-04-10 DIAGNOSIS — J069 Acute upper respiratory infection, unspecified: Secondary | ICD-10-CM

## 2013-04-10 DIAGNOSIS — R05 Cough: Secondary | ICD-10-CM

## 2013-04-10 DIAGNOSIS — I10 Essential (primary) hypertension: Secondary | ICD-10-CM

## 2013-04-10 MED ORDER — AZITHROMYCIN 500 MG PO TABS: 500.0000 mg | ORAL_TABLET | Freq: Every day | ORAL | Status: DC

## 2013-04-10 MED ORDER — HYDROCODONE-HOMATROPINE 5-1.5 MG/5ML PO SYRP: 5.0000 mL | ORAL_SOLUTION | Freq: Three times a day (TID) | ORAL | Status: DC | PRN

## 2013-04-10 MED ORDER — ACLIDINIUM BROMIDE 400 MCG/ACT IN AEPB: 1.0000 | INHALATION_SPRAY | Freq: Two times a day (BID) | RESPIRATORY_TRACT | Status: DC

## 2013-04-10 NOTE — Assessment & Plan Note (Signed)
He is not consistent with tudorza so I have asked him to restart it and stay on it daily

## 2013-04-10 NOTE — Assessment & Plan Note (Signed)
Will treat the infection with Zpak and control the cough with a suppressant

## 2013-04-10 NOTE — Patient Instructions (Signed)

## 2013-04-10 NOTE — Assessment & Plan Note (Signed)
BP is well controlled today

## 2013-04-10 NOTE — Progress Notes (Signed)
  Subjective:    Patient ID: Kevin Bradley, male    DOB: 05/02/1946, 67 y.o.   MRN: [id number]  Cough This is a new problem. The current episode started in the past 7 days. The problem has been unchanged. The problem occurs every few hours. The cough is productive of purulent sputum. Associated symptoms include rhinorrhea and wheezing. Pertinent negatives include no chest pain, chills, ear congestion, ear pain, fever, headaches, heartburn, hemoptysis, myalgias, nasal congestion, postnasal drip, rash, sore throat, shortness of breath, sweats or weight loss. Nothing aggravates the symptoms. He has tried ipratropium inhaler for the symptoms. The treatment provided mild relief. His past medical history is significant for bronchitis and COPD. There is no history of asthma or pneumonia.      Review of Systems  Constitutional: Negative.  Negative for fever, chills, weight loss, diaphoresis, activity change, appetite change, fatigue and unexpected weight change.  HENT: Positive for rhinorrhea and sinus pressure. Negative for ear pain, nosebleeds, congestion, sore throat, sneezing, dental problem and postnasal drip.   Eyes: Negative.   Respiratory: Positive for cough and wheezing. Negative for hemoptysis, chest tightness and shortness of breath.   Cardiovascular: Negative.  Negative for chest pain, palpitations and leg swelling.  Gastrointestinal: Negative.  Negative for heartburn, nausea, vomiting, abdominal pain, diarrhea and constipation.  Endocrine: Negative.   Genitourinary: Negative.   Musculoskeletal: Negative.  Negative for myalgias.  Skin: Negative.  Negative for rash.  Allergic/Immunologic: Negative.   Neurological: Negative.  Negative for dizziness and headaches.  Hematological: Negative.  Negative for adenopathy. Does not bruise/bleed easily.  Psychiatric/Behavioral: Negative.        Objective:   Physical Exam  Vitals reviewed. Constitutional: He is oriented to person, place, and  time. He appears well-developed and well-nourished.  Non-toxic appearance. He does not have a sickly appearance. He does not appear ill. No distress.  HENT:  Head: Normocephalic and atraumatic.  Mouth/Throat: Oropharynx is clear and moist. No oropharyngeal exudate.  Eyes: Conjunctivae are normal. Right eye exhibits no discharge. Left eye exhibits no discharge. No scleral icterus.  Neck: Normal range of motion. Neck supple. No JVD present. No tracheal deviation present. No thyromegaly present.  Cardiovascular: Normal rate, regular rhythm, normal heart sounds and intact distal pulses.  Exam reveals no gallop and no friction rub.   No murmur heard. Pulmonary/Chest: Effort normal and breath sounds normal. No accessory muscle usage or stridor. Not tachypneic. No respiratory distress. He has no decreased breath sounds. He has no wheezes. He has no rhonchi. He has no rales. He exhibits no tenderness.  Abdominal: Soft. Bowel sounds are normal. He exhibits no distension and no mass. There is no tenderness. There is no rebound and no guarding.  Musculoskeletal: Normal range of motion. He exhibits no edema and no tenderness.  Lymphadenopathy:    He has no cervical adenopathy.  Neurological: He is oriented to person, place, and time.  Skin: Skin is warm and dry. No rash noted. He is not diaphoretic. No erythema. No pallor.  Psychiatric: He has a normal mood and affect. His behavior is normal. Judgment and thought content normal.          Assessment & Plan:

## 2013-04-11 ENCOUNTER — Telehealth: Payer: Self-pay | Admitting: Cardiology

## 2013-04-11 MED ORDER — ACLIDINIUM BROMIDE 400 MCG/ACT IN AEPB: 1.0000 | INHALATION_SPRAY | Freq: Two times a day (BID) | RESPIRATORY_TRACT | Status: DC

## 2013-04-11 NOTE — Telephone Encounter (Signed)
New Prob  Pt said he has bronchitis and was prescribed a azithromycin, He wants to know if this is safe to take with his flecainide.

## 2013-04-11 NOTE — Telephone Encounter (Signed)
Reviewed drug interaction with Audrie Lia, Pharm D. Patient's QTc in April 2014 is ok. The patient should be ok to take azithromycin. I have made the patient aware.

## 2013-04-11 NOTE — Addendum Note (Signed)
Addended by: Etta Grandchild on: 04/11/2013 08:36 AM   Modules accepted: Orders

## 2013-05-05 ENCOUNTER — Telehealth: Payer: Self-pay | Admitting: *Deleted

## 2013-05-05 NOTE — Telephone Encounter (Signed)
Pt called requesting Diazepam 5mg  tab Rx for his flight to [place].  Please advise

## 2013-05-07 ENCOUNTER — Other Ambulatory Visit: Payer: Self-pay | Admitting: Internal Medicine

## 2013-05-08 NOTE — Telephone Encounter (Signed)
ok 

## 2013-05-10 ENCOUNTER — Other Ambulatory Visit: Payer: Self-pay | Admitting: *Deleted

## 2013-05-10 DIAGNOSIS — R05 Cough: Secondary | ICD-10-CM

## 2013-05-10 DIAGNOSIS — J209 Acute bronchitis, unspecified: Secondary | ICD-10-CM

## 2013-05-10 DIAGNOSIS — R059 Cough, unspecified: Secondary | ICD-10-CM

## 2013-05-10 MED ORDER — DIAZEPAM 5 MG PO TABS: 5.0000 mg | ORAL_TABLET | Freq: Three times a day (TID) | ORAL | Status: DC | PRN

## 2013-05-10 NOTE — Telephone Encounter (Signed)
Spoke with pt advised Rx ready for pick up 

## 2013-07-13 ENCOUNTER — Other Ambulatory Visit: Payer: Self-pay

## 2013-08-17 ENCOUNTER — Other Ambulatory Visit: Payer: Self-pay | Admitting: Physician Assistant

## 2013-09-13 ENCOUNTER — Other Ambulatory Visit: Payer: Self-pay | Admitting: Internal Medicine

## 2013-10-09 ENCOUNTER — Other Ambulatory Visit: Payer: Self-pay | Admitting: Internal Medicine

## 2013-10-11 ENCOUNTER — Telehealth: Payer: Self-pay

## 2013-10-11 DIAGNOSIS — I10 Essential (primary) hypertension: Secondary | ICD-10-CM

## 2013-10-11 DIAGNOSIS — I4891 Unspecified atrial fibrillation: Secondary | ICD-10-CM

## 2013-10-11 MED ORDER — DILTIAZEM HCL ER COATED BEADS 180 MG PO TB24: 180.0000 mg | ORAL_TABLET | Freq: Every day | ORAL | Status: DC

## 2013-10-11 NOTE — Telephone Encounter (Signed)
Received fax from [facility] stating that Kevin Bradley is not available. Pharmacy request to know what MD would like to switch to? Thanks

## 2013-10-11 NOTE — Telephone Encounter (Signed)
Change to cardizem

## 2013-10-16 ENCOUNTER — Ambulatory Visit (INDEPENDENT_AMBULATORY_CARE_PROVIDER_SITE_OTHER): Payer: BC Managed Care – PPO | Admitting: Internal Medicine

## 2013-10-16 ENCOUNTER — Other Ambulatory Visit (INDEPENDENT_AMBULATORY_CARE_PROVIDER_SITE_OTHER): Payer: BC Managed Care – PPO

## 2013-10-16 ENCOUNTER — Encounter: Payer: Self-pay | Admitting: Internal Medicine

## 2013-10-16 VITALS — BP 130/90 | HR 52 | Temp 98.0°F | Resp 16 | Ht 72.0 in | Wt 195.0 lb

## 2013-10-16 DIAGNOSIS — Z0000 Encounter for general adult medical examination without abnormal findings: Secondary | ICD-10-CM

## 2013-10-16 DIAGNOSIS — E279 Disorder of adrenal gland, unspecified: Secondary | ICD-10-CM

## 2013-10-16 DIAGNOSIS — F17200 Nicotine dependence, unspecified, uncomplicated: Secondary | ICD-10-CM

## 2013-10-16 DIAGNOSIS — I10 Essential (primary) hypertension: Secondary | ICD-10-CM

## 2013-10-16 DIAGNOSIS — J4489 Other specified chronic obstructive pulmonary disease: Secondary | ICD-10-CM

## 2013-10-16 DIAGNOSIS — E039 Hypothyroidism, unspecified: Secondary | ICD-10-CM

## 2013-10-16 DIAGNOSIS — I4891 Unspecified atrial fibrillation: Secondary | ICD-10-CM

## 2013-10-16 DIAGNOSIS — Z23 Encounter for immunization: Secondary | ICD-10-CM

## 2013-10-16 DIAGNOSIS — Z860100 Personal history of colon polyps, unspecified: Secondary | ICD-10-CM

## 2013-10-16 DIAGNOSIS — E278 Other specified disorders of adrenal gland: Secondary | ICD-10-CM

## 2013-10-16 DIAGNOSIS — J449 Chronic obstructive pulmonary disease, unspecified: Secondary | ICD-10-CM

## 2013-10-16 DIAGNOSIS — Z2911 Encounter for prophylactic immunotherapy for respiratory syncytial virus (RSV): Secondary | ICD-10-CM

## 2013-10-16 LAB — TSH: TSH: 3.06 u[IU]/mL (ref 0.35–5.50)

## 2013-10-16 LAB — CBC WITH DIFFERENTIAL/PLATELET
Basophils Absolute: 0 10*3/uL (ref 0.0–0.1)
Basophils Relative: 0.4 % (ref 0.0–3.0)
Eosinophils Absolute: 0.1 10*3/uL (ref 0.0–0.7)
Eosinophils Relative: 1.7 % (ref 0.0–5.0)
HCT: 51.2 % (ref 39.0–52.0)
HEMOGLOBIN: 17.4 g/dL — AB (ref 13.0–17.0)
LYMPHS PCT: 27.7 % (ref 12.0–46.0)
Lymphs Abs: 2.2 10*3/uL (ref 0.7–4.0)
MCHC: 34.1 g/dL (ref 30.0–36.0)
MCV: 90.8 fl (ref 78.0–100.0)
Monocytes Absolute: 0.6 10*3/uL (ref 0.1–1.0)
Monocytes Relative: 8 % (ref 3.0–12.0)
NEUTROS PCT: 62.2 % (ref 43.0–77.0)
Neutro Abs: 4.9 10*3/uL (ref 1.4–7.7)
Platelets: 226 10*3/uL (ref 150.0–400.0)
RBC: 5.64 Mil/uL (ref 4.22–5.81)
RDW: 12.2 % (ref 11.5–14.6)
WBC: 7.8 10*3/uL (ref 4.5–10.5)

## 2013-10-16 LAB — LIPID PANEL
Cholesterol: 153 mg/dL (ref 0–200)
HDL: 35.3 mg/dL — AB (ref >39.00)
TRIGLYCERIDES: 247 mg/dL — AB (ref 0.0–149.0)
Total CHOL/HDL Ratio: 4
VLDL: 49.4 mg/dL — ABNORMAL HIGH (ref 0.0–40.0)

## 2013-10-16 LAB — LDL CHOLESTEROL, DIRECT: LDL DIRECT: 77.8 mg/dL

## 2013-10-16 LAB — BASIC METABOLIC PANEL
BUN: 21 mg/dL (ref 6–23)
CALCIUM: 8.9 mg/dL (ref 8.4–10.5)
CO2: 24 mEq/L (ref 19–32)
Chloride: 105 mEq/L (ref 96–112)
Creatinine, Ser: 0.9 mg/dL (ref 0.4–1.5)
GFR: 85.78 mL/min (ref >60.00)
GLUCOSE: 97 mg/dL (ref 70–99)
Potassium: 3.6 mEq/L (ref 3.5–5.1)
SODIUM: 139 meq/L (ref 135–145)

## 2013-10-16 NOTE — Progress Notes (Signed)
Subjective:    Patient ID: Kevin Bradley, male    DOB: 10/09/1945, 68 y.o.   MRN: [id number]  Thyroid Problem Presents for follow-up visit. Patient reports no anxiety, cold intolerance, constipation, depressed mood, diaphoresis, diarrhea, dry skin, fatigue, hair loss, heat intolerance, hoarse voice, leg swelling, nail problem, palpitations, tremors, visual change, weight gain or weight loss. The symptoms have been stable. Past treatments include levothyroxine. The treatment provided significant relief. His past medical history is significant for atrial fibrillation.      Review of Systems  Constitutional: Negative.  Negative for fever, chills, weight loss, weight gain, diaphoresis, appetite change and fatigue.  HENT: Negative.  Negative for hoarse voice.   Eyes: Negative.   Respiratory: Negative.  Negative for cough, choking, chest tightness, shortness of breath, wheezing and stridor.   Cardiovascular: Negative.  Negative for chest pain, palpitations and leg swelling.  Gastrointestinal: Negative.  Negative for nausea, vomiting, abdominal pain, diarrhea and constipation.  Endocrine: Negative.  Negative for cold intolerance and heat intolerance.  Genitourinary: Negative.   Musculoskeletal: Negative.  Negative for arthralgias, myalgias and neck stiffness.  Skin: Negative.   Allergic/Immunologic: Negative.   Neurological: Negative.  Negative for dizziness, tremors, syncope, weakness and light-headedness.  Hematological: Negative.  Negative for adenopathy. Does not bruise/bleed easily.  Psychiatric/Behavioral: Negative.        Objective:   Physical Exam  Vitals reviewed. Constitutional: He is oriented to person, place, and time. He appears well-developed and well-nourished. No distress.  HENT:  Head: Normocephalic and atraumatic.  Mouth/Throat: Oropharynx is clear and moist. No oropharyngeal exudate.  Eyes: Conjunctivae are normal. Right eye exhibits no discharge. Left eye exhibits  no discharge. No scleral icterus.  Neck: Normal range of motion. Neck supple. No JVD present. No tracheal deviation present. No thyromegaly present.  Cardiovascular: Normal rate, regular rhythm, normal heart sounds and intact distal pulses.  Exam reveals no gallop and no friction rub.   No murmur heard. Pulmonary/Chest: Effort normal and breath sounds normal. No stridor. No respiratory distress. He has no wheezes. He has no rales. He exhibits no tenderness.  Abdominal: Soft. Bowel sounds are normal. He exhibits no distension and no mass. There is no tenderness. There is no rebound and no guarding. Hernia confirmed negative in the right inguinal area and confirmed negative in the left inguinal area.  Genitourinary: Rectum normal, prostate normal, testes normal and penis normal. Rectal exam shows no external hemorrhoid, no internal hemorrhoid, no fissure, no mass, no tenderness and anal tone normal. Guaiac negative stool. Prostate is not enlarged and not tender. Right testis shows no mass, no swelling and no tenderness. Right testis is descended. Left testis shows no mass, no swelling and no tenderness. Left testis is descended. Uncircumcised. No phimosis, paraphimosis, hypospadias, penile erythema or penile tenderness. No discharge found.  Musculoskeletal: Normal range of motion. He exhibits no edema and no tenderness.  Lymphadenopathy:    He has no cervical adenopathy.       Right: No inguinal adenopathy present.       Left: No inguinal adenopathy present.  Neurological: He is oriented to person, place, and time.  Skin: Skin is warm and dry. No rash noted. He is not diaphoretic. No erythema. No pallor.  Psychiatric: He has a normal mood and affect. His behavior is normal. Judgment and thought content normal.     Lab Results  Component Value Date   WBC 7.9 10/17/2012   HGB 18.7 Repeated and verified X2.* 10/17/2012  HCT 55.9* 10/17/2012   PLT 203.0 10/17/2012   GLUCOSE 91 10/17/2012   CHOL 177  10/17/2012   TRIG 157.0* 10/17/2012   HDL 38.60* 10/17/2012   LDLCALC 107* 10/17/2012   ALT 20 10/17/2012   AST 15 10/17/2012   NA 139 10/17/2012   K 4.2 10/17/2012   CL 102 10/17/2012   CREATININE 0.9 10/17/2012   BUN 21 10/17/2012   CO2 31 10/17/2012   TSH 2.76 10/17/2012   PSA 2.29 10/17/2012   HGBA1C 5.9 10/17/2012       Assessment & Plan:

## 2013-10-16 NOTE — Assessment & Plan Note (Addendum)

## 2013-10-16 NOTE — Progress Notes (Signed)
Pre visit review using our clinic review tool, if applicable. No additional management support is needed unless otherwise documented below in the visit note. 

## 2013-10-16 NOTE — Patient Instructions (Addendum)
Health Maintenance, Males A healthy lifestyle and preventative care can promote health and wellness.  Maintain regular health, dental, and eye exams.  Eat a healthy diet. Foods like vegetables, fruits, whole grains, low-fat dairy products, and lean protein foods contain the nutrients you need and are low in calories. Decrease your intake of foods high in solid fats, added sugars, and salt. Get information about a proper diet from your health care provider, if necessary.  Regular physical exercise is one of the most important things you can do for your health. Most adults should get at least 150 minutes of moderate-intensity exercise (any activity that increases your heart rate and causes you to sweat) each week. In addition, most adults need muscle-strengthening exercises on 2 or more days a week.   Maintain a healthy weight. The body mass index (BMI) is a screening tool to identify possible weight problems. It provides an estimate of body fat based on height and weight. Your health care provider can find your BMI and can help you achieve or maintain a healthy weight. For males 20 years and older:  A BMI below 18.5 is considered underweight.  A BMI of 18.5 to 24.9 is normal.  A BMI of 25 to 29.9 is considered overweight.  A BMI of 30 and above is considered obese.  Maintain normal blood lipids and cholesterol by exercising and minimizing your intake of saturated fat. Eat a balanced diet with plenty of fruits and vegetables. Blood tests for lipids and cholesterol should begin at age 20 and be repeated every 5 years. If your lipid or cholesterol levels are high, you are over 50, or you are at high risk for heart disease, you may need your cholesterol levels checked more frequently.Ongoing high lipid and cholesterol levels should be treated with medicines, if diet and exercise are not working.  If you smoke, find out from your health care provider how to quit. If you do not use tobacco, do not  start.  Lung cancer screening is recommended for adults aged 55 80 years who are at high risk for developing lung cancer because of a history of smoking. A yearly low-dose CT scan of the lungs is recommended for people who have at least a 30-pack-year history of smoking and are a current smoker or have quit within the past 15 years. A pack year of smoking is smoking an average of 1 pack of cigarettes a day for 1 year (for example, a 30-pack-year history of smoking could mean smoking 1 pack a day for 30 years or 2 packs a day for 15 years). Yearly screening should continue until the smoker has stopped smoking for at least 15 years. Yearly screening should be stopped for people who develop a health problem that would prevent them from having lung cancer treatment.  If you choose to drink alcohol, do not have more than 2 drinks per day. One drink is considered to be 12 oz (360 mL) of beer, 5 oz (150 mL) of wine, or 1.5 oz (45 mL) of liquor.  Avoid use of street drugs. Do not share needles with anyone. Ask for help if you need support or instructions about stopping the use of drugs.  High blood pressure causes heart disease and increases the risk of stroke. Blood pressure should be checked at least every 1 2 years. Ongoing high blood pressure should be treated with medicines if weight loss and exercise are not effective.  If you are 45 69 years old, ask your health   care provider if you should take aspirin to prevent heart disease.  Diabetes screening involves taking a blood sample to check your fasting blood sugar level. This should be done once every 3 years after age 45, if you are at a normal weight and without risk factors for diabetes. Testing should be considered at a younger age or be carried out more frequently if you are overweight and have at least 1 risk factor for diabetes.  Colorectal cancer can be detected and often prevented. Most routine colorectal cancer screening begins at the age of 50  and continues through age 75. However, your health care provider may recommend screening at an earlier age if you have risk factors for colon cancer. On a yearly basis, your health care provider may provide home test kits to check for hidden blood in the stool. A small camera at the end of a tube may be used to directly examine the colon (sigmoidoscopy or colonoscopy) to detect the earliest forms of colorectal cancer. Talk to your health care provider about this at age 50, when routine screening begins. A direct exam of the colon should be repeated every 5 10 years through age 75, unless early forms of pre-cancerous polyps or small growths are found.  People who are at an increased risk for hepatitis B should be screened for this virus. You are considered at high risk for hepatitis B if:  You were born in a country where hepatitis B occurs often. Talk with your health care provider about which countries are considered high-risk.  Your parents were born in a high-risk country and you have not received a shot to protect against hepatitis B (hepatitis B vaccine).  You have HIV or AIDS.  You use needles to inject street drugs.  You live with, or have sex with, someone who has hepatitis B.  You are a man who has sex with other men (MSM).  You get hemodialysis treatment.  You take certain medicines for conditions like cancer, organ transplantation, and autoimmune conditions.  Hepatitis C blood testing is recommended for all people born from 1945 through 1965 and any individual with known risk factors for hepatitis C.  Healthy men should no longer receive prostate-specific antigen (PSA) blood tests as part of routine cancer screening. Talk to your health care provider about prostate cancer screening.  Testicular cancer screening is not recommended for adolescents or adult males who have no symptoms. Screening includes self-exam, a health care provider exam, and other screening tests. Consult with  your health care provider about any symptoms you have or any concerns you have about testicular cancer.  Practice safe sex. Use condoms and avoid high-risk sexual practices to reduce the spread of sexually transmitted infections (STIs).  Use sunscreen. Apply sunscreen liberally and repeatedly throughout the day. You should seek shade when your shadow is shorter than you. Protect yourself by wearing long sleeves, pants, a wide-brimmed hat, and sunglasses year round, whenever you are outdoors.  Tell your health care provider of new moles or changes in moles, especially if there is a change in shape or color. Also tell your provider if a mole is larger than the size of a pencil eraser.  A one-time screening for abdominal aortic aneurysm (AAA) and surgical repair of large AAAs by ultrasound is recommended for men aged 65 75 years who are current or former smokers.  Stay current with your vaccines (immunizations). Document Released: 02/20/2008 Document Revised: 06/14/2013 Document Reviewed: 01/19/2011 [employer] Patient Information 2014 [employer].   Hypothyroidism The thyroid is a large gland located in the lower front of your neck. The thyroid gland helps control metabolism. Metabolism is how your body handles food. It controls metabolism with the hormone thyroxine. When this gland is underactive (hypothyroid), it produces too little hormone.  CAUSES These include:   Absence or destruction of thyroid tissue.  Goiter due to iodine deficiency.  Goiter due to medications.  Congenital defects (since birth).  Problems with the pituitary. This causes a lack of TSH (thyroid stimulating hormone). This hormone tells the thyroid to turn out more hormone. SYMPTOMS  Lethargy (feeling as though you have no energy)  Cold intolerance  Weight gain (in spite of normal food intake)  Dry skin  Coarse hair  Menstrual irregularity (if severe, may lead to infertility)  Slowing of thought  processes Cardiac problems are also caused by insufficient amounts of thyroid hormone. Hypothyroidism in the newborn is cretinism, and is an extreme form. It is important that this form be treated adequately and immediately or it will lead rapidly to retarded physical and mental development. DIAGNOSIS  To prove hypothyroidism, your caregiver may do blood tests and ultrasound tests. Sometimes the signs are hidden. It may be necessary for your caregiver to watch this illness with blood tests either before or after diagnosis and treatment. TREATMENT  Low levels of thyroid hormone are increased by using synthetic thyroid hormone. This is a safe, effective treatment. It usually takes about four weeks to gain the full effects of the medication. After you have the full effect of the medication, it will generally take another four weeks for problems to leave. Your caregiver may start you on low doses. If you have had heart problems the dose may be gradually increased. It is generally not an emergency to get rapidly to normal. HOME CARE INSTRUCTIONS   Take your medications as your caregiver suggests. Let your caregiver know of any medications you are taking or start taking. Your caregiver will help you with dosage schedules.  As your condition improves, your dosage needs may increase. It will be necessary to have continuing blood tests as suggested by your caregiver.  Report all suspected medication side effects to your caregiver. SEEK MEDICAL CARE IF: Seek medical care if you develop:  Sweating.  Tremulousness (tremors).  Anxiety.  Rapid weight loss.  Heat intolerance.  Emotional swings.  Diarrhea.  Weakness. SEEK IMMEDIATE MEDICAL CARE IF:  You develop chest pain, an irregular heart beat (palpitations), or a rapid heart beat. MAKE SURE YOU:   Understand these instructions.  Will watch your condition.  Will get help right away if you are not doing well or get worse. Document Released:  08/24/2005 Document Revised: 11/16/2011 Document Reviewed: 04/13/2008 [facility] Patient Information 2014 [place].

## 2013-10-16 NOTE — Assessment & Plan Note (Signed)
I have ordered a f/up CT scan to check the stability of this

## 2013-10-16 NOTE — Assessment & Plan Note (Signed)
I will recheck his TSH and will adjust his dose if needed 

## 2013-10-16 NOTE — Assessment & Plan Note (Signed)
He is down to 2 cigars per day I have asked him to quit completely

## 2013-10-16 NOTE — Assessment & Plan Note (Signed)
He has good rate and rhythm control He is not willing to take coumadin or a NOAC

## 2013-10-16 NOTE — Assessment & Plan Note (Signed)
He is doing well on tudorza Will try to quit smoking

## 2013-10-16 NOTE — Assessment & Plan Note (Signed)
His BP is well controlled 

## 2013-10-17 ENCOUNTER — Encounter: Payer: Self-pay | Admitting: Internal Medicine

## 2013-10-19 ENCOUNTER — Other Ambulatory Visit: Payer: BC Managed Care – PPO

## 2013-10-23 ENCOUNTER — Encounter: Payer: Self-pay | Admitting: Internal Medicine

## 2013-10-23 ENCOUNTER — Ambulatory Visit (INDEPENDENT_AMBULATORY_CARE_PROVIDER_SITE_OTHER)
Admission: RE | Admit: 2013-10-23 | Discharge: 2013-10-23 | Disposition: A | Discharge status: Home / Self Care | Payer: BC Managed Care – PPO | Source: Ambulatory Visit | Attending: Internal Medicine | Admitting: Internal Medicine

## 2013-10-23 DIAGNOSIS — E279 Disorder of adrenal gland, unspecified: Secondary | ICD-10-CM

## 2013-10-23 DIAGNOSIS — E278 Other specified disorders of adrenal gland: Secondary | ICD-10-CM

## 2013-12-02 ENCOUNTER — Other Ambulatory Visit: Payer: Self-pay | Admitting: Internal Medicine

## 2013-12-12 ENCOUNTER — Other Ambulatory Visit: Payer: Self-pay | Admitting: Internal Medicine

## 2014-01-01 ENCOUNTER — Other Ambulatory Visit: Payer: Self-pay | Admitting: Internal Medicine

## 2014-01-15 ENCOUNTER — Other Ambulatory Visit: Payer: Self-pay | Admitting: Cardiology

## 2014-01-16 ENCOUNTER — Ambulatory Visit: Payer: BC Managed Care – PPO | Admitting: Cardiology

## 2014-01-18 ENCOUNTER — Encounter: Payer: Self-pay | Admitting: Cardiology

## 2014-01-18 ENCOUNTER — Ambulatory Visit (INDEPENDENT_AMBULATORY_CARE_PROVIDER_SITE_OTHER): Payer: BC Managed Care – PPO | Admitting: Cardiology

## 2014-01-18 ENCOUNTER — Other Ambulatory Visit: Payer: Self-pay | Admitting: Cardiology

## 2014-01-18 VITALS — BP 149/77 | HR 53 | Ht 72.0 in | Wt 200.0 lb

## 2014-01-18 DIAGNOSIS — I10 Essential (primary) hypertension: Secondary | ICD-10-CM

## 2014-01-18 DIAGNOSIS — I4891 Unspecified atrial fibrillation: Secondary | ICD-10-CM

## 2014-01-18 NOTE — Assessment & Plan Note (Signed)
Patient remains in sinus rhythm. He has occasional palpitations lasting less than 1 minute that he feels is similar to previous atrial fibrillation. However they are much less frequent compared to when he was off of flecainide. We can increase the dose in the future if needed. Continue aspirin. He declines Coumadin or new oral anticoagulant agents. He understands the higher risk of CVA.

## 2014-01-18 NOTE — Assessment & Plan Note (Signed)
Blood pressure mildly elevated but he follows this at home and it is typically controlled. We will follow and adjust as needed.

## 2014-01-18 NOTE — Patient Instructions (Signed)
Your physician wants you to follow-up in: ONE YEAR WITH DR CRENSHAW You will receive a reminder letter in the mail two months in advance. If you don't receive a letter, please call our office to schedule the follow-up appointment.  

## 2014-01-18 NOTE — Progress Notes (Signed)
HPI: FU atrial fibrillation. Note TSH in March of 2011 was normal at 1.56. A previous cardioNet monitor revealed sinus rhythm with occasional bursts of atrial fibrillation. He had an episode of chest pain approximately 9 years ago and had a cardiac catheterization that was normal by his report perfomed in [place]. An echocardiogram in May of 2011 revealed normal LV function, trivial aortic insufficiency and mild left atrial enlargement. Patient placed on flecanide previously. A followup stress echocardiogram showed no ischemia and normal LV function. There was a question of septal hypokinesis both at rest and with stress. There was no exercise induced arrhythmias. Cardionet in Aug 2012 showed sinus with PAT but no VT. Previously declined anticoagulants. Abdominal ultrasound in May of 2014 showed no aneurysm. Since I last saw him in April 2014, He has dyspnea with more extreme activities but not routine activities. No chest pain. Occasional brief outpatient. No syncope.   Current Outpatient Prescriptions  Medication Sig Dispense Refill  . aspirin EC 325 MG tablet Take 325 mg by mouth daily.        Marland Kitchen BENICAR HCT 40-12.5 MG per tablet TAKE 1 TABLET BY MOUTH DAILY  90 tablet  1  . diltiazem (CARDIZEM LA) 180 MG 24 hr tablet Take 1 tablet (180 mg total) by mouth daily.  90 tablet  3  . Fish Oil OIL Take 1 tablet by mouth daily.        . flecainide (TAMBOCOR) 50 MG tablet TAKE 1 TABLET BY MOUTH TWICE A DAY      . levothyroxine (SYNTHROID, LEVOTHROID) 50 MCG tablet TAKE 1 TABLET BY MOUTH DAILY  90 tablet  0  . nebivolol (BYSTOLIC) 2.5 MG tablet Take 1 tablet (2.5 mg total) by mouth daily.  90 tablet  4  . potassium chloride SA (K-DUR,KLOR-CON) 20 MEQ tablet TAKE 1 TABLET BY MOUTH  DAILY      . VITAMIN D, CHOLECALCIFEROL, PO Take 1 tablet by mouth daily.        No current facility-administered medications for this visit.     Past Medical History  Diagnosis Date  . Atrial fibrillation   . HTN  (hypertension)   . External thrombosed hemorrhoids   . Hx of colonic polyps   . BPH (benign prostatic hypertrophy)   . Hypothyroidism   . Anxiety   . Low HDL (under 40)   . Nephrolithiasis   . History of colonoscopy     Past Surgical History  Procedure Laterality Date  . Inguinal hernia repair    . Tonsillectomy    . Thyroidectomy, partial    . Appendectomy      History   Social History  . Marital Status: Married    Spouse Name: N/A    Number of Children: N/A  . Years of Education: N/A   Occupational History  . Truck Geophysicist/field seismologist    Social History Main Topics  . Smoking status: Former Smoker    Types: Cigars    Quit date: 10/08/2010  . Smokeless tobacco: Never Used  . Alcohol Use: No  . Drug Use: No  . Sexual Activity: Yes   Other Topics Concern  . Not on file   Social History Narrative  . No narrative on file    ROS: no fevers or chills, productive cough, hemoptysis, dysphasia, odynophagia, melena, hematochezia, dysuria, hematuria, rash, seizure activity, orthopnea, PND, pedal edema, claudication. Remaining systems are negative.  Physical Exam: Well-developed well-nourished in no acute distress.  Skin is warm  and dry.  HEENT is normal.  Neck is supple.  Chest is clear to auscultation with normal expansion.  Cardiovascular exam is regular rate and rhythm.  Abdominal exam nontender or distended. No masses palpated. Extremities show no edema. neuro grossly intact  ECG Sinus rhythmAt a rate of 53. First degree AV block. No ST changes.

## 2014-01-22 ENCOUNTER — Telehealth: Payer: Self-pay | Admitting: *Deleted

## 2014-01-22 NOTE — Telephone Encounter (Signed)
Patient assist forms for bystolic through [employer] mailed to patient.

## 2014-01-25 NOTE — Telephone Encounter (Signed)
CORRECTION--------the bystolic assistance program is [employer], script faxed to [phone]

## 2014-01-25 NOTE — Telephone Encounter (Signed)
Faxed script signed by dr Stanford Breed to [facility] patient assistance program for Bystolic, awaiting patient application.

## 2014-02-06 ENCOUNTER — Telehealth: Payer: Self-pay | Admitting: Internal Medicine

## 2014-02-06 NOTE — Telephone Encounter (Signed)
Pt scheduled appoint with Dr Linna Darner on Friday 6.5.15.

## 2014-02-06 NOTE — Telephone Encounter (Signed)
Patient Information:  Caller Name: Brooks  Phone: [phone]  Patient: Kevin Bradley, Kevin Bradley  Gender: Male  DOB: 10/07/1945  Age: 68 Years  PCP: Scarlette Calico (Adults only)  Office Follow Up:  Does the office need to follow up with this patient?: Yes  Instructions For The Office: No appts. available in [facility]. Patient declines to be seen at another office location. Please return call to patient at [phone] with recommendation per Dr. [name].  RN Note:  Patient states he developed a sudden onset of blurred vision and feeling lightheaded, onset 1800 02/05/14. Patient states episode lasted approx. 2 seconds and resolved spontaneously but recurred a few seconds later. States second episode lasted approx. 2 seconds and resolved. Patient describes near syncope. Patient states he felt weak and shakey ater the episode. States weakness and shakiness lasted approx. 15-20 minutes and resolved. States no additional sx. Denies chest pain or shortness of breath. Denies headache. Afebrile. Denies numbness tingling, weakness or paralysis. Denies eye pain. Patient states he is asymptomatic 02/06/14. Care advice given per guidelines. Patient advsied to change positions slowly. Patient advised not to drive.  Call back parameters reviewed. Patient verbalizes understanding. No appts. available in [facility]. Patient declines to be seen at another office location. Please return call to patient at [phone] with recommendation per Dr. [name].  Symptoms  Reason For Call & Symptoms: Blurred Vision/Lightheaded  Reviewed Health History In EMR: Yes  Reviewed Medications In EMR: Yes  Reviewed Allergies In EMR: Yes  Reviewed Surgeries / Procedures: Yes  Date of Onset of Symptoms: 02/05/2014  Guideline(s) Used:  Dizziness  Disposition Per Guideline:   Discuss with PCP and Callback by Nurse Today  Reason For Disposition Reached:   Taking a medicine that could cause  dizziness (e.g., blood pressure medications, diuretics)  Advice Given:  Drink Fluids:  Drink several glasses of fruit juice, other clear fluids, or water. This will improve hydration and blood glucose. If you have a fever or have had heat exposure, make sure the fluids are cold.  Call Back If:  Passes out (faints)  You become worse.  RN Overrode Recommendation:  Make Appointment  Nursing judgement.

## 2014-02-07 ENCOUNTER — Other Ambulatory Visit: Payer: Self-pay | Admitting: Cardiology

## 2014-02-09 ENCOUNTER — Ambulatory Visit (INDEPENDENT_AMBULATORY_CARE_PROVIDER_SITE_OTHER): Payer: BC Managed Care – PPO | Admitting: Internal Medicine

## 2014-02-09 ENCOUNTER — Encounter: Payer: Self-pay | Admitting: Internal Medicine

## 2014-02-09 VITALS — BP 128/76 | HR 56 | Temp 98.0°F | Resp 14 | Wt 198.6 lb

## 2014-02-09 DIAGNOSIS — E781 Pure hyperglyceridemia: Secondary | ICD-10-CM

## 2014-02-09 DIAGNOSIS — I4891 Unspecified atrial fibrillation: Secondary | ICD-10-CM

## 2014-02-09 DIAGNOSIS — I480 Paroxysmal atrial fibrillation: Secondary | ICD-10-CM

## 2014-02-09 DIAGNOSIS — Z8669 Personal history of other diseases of the nervous system and sense organs: Secondary | ICD-10-CM

## 2014-02-09 NOTE — Progress Notes (Signed)
Subjective:    Patient ID: Kevin Bradley, male    DOB: [birth year], 68 y.o.   MRN: [id number]  HPI   He experienced an episode of blurring of vision 02/05/14 after eating evening meal. He was in [place] for his job. He noted this only on the right and this lasted for 2 seconds before resolving. It recurred for the same amount of time but again resolved spontaneously. He's had no episodes since.  He may have had some slowing of his heart rate prior to the event. He had associated lightheadedness, weakness in the knees, some anxiety, and felt that he may have hyperventilated. There were no residual symptoms after 15-20 minutes  He has seen his Ophthalmologist 2 weeks prior to event for a scleral blood vessel rupture in the right eye. That did resolve. He notified his Ophthalmologist of these events  & was told to increase his topical liquefying agent from 2-4 times a day. He has a followup appointment 02/23/49.  Other than the possible slowing he denies any other cardiac or neurologic prodrome prior to the event.  History was reviewed. He has a history of fibrillation and is on full dose aspirin.  On 10/16/13 Triglycerides were 247; LDL 77.8; and HDL 35.3. His hemoglobin was mildly elevated at the time of 17.4 with a normal hematocrit.    Review of Systems   He has chronic tinnitus w/o associated hearing loss  He's had no hoarseness or dysphagia.  Chest pain, palpitations, tachycardia, exertional dyspnea, paroxysmal nocturnal dyspnea, claudication or edema are absent. Specifically denied prior than were headache or limb weakness, tingling, or numbness. No seizure activity noted.         Objective:   Physical Exam Pertinent findings include a slightly slow irregular rhythm initially. Subsequently the heart rhythm clinically appeared to be essentially normal. There was accentuation of the second heart sound. Gen.:  well-nourished in appearance. Alert, appropriate and cooperative  throughout exam. Head: Normocephalic without obvious abnormalities Eyes: No corneal or conjunctival inflammation noted. Pupils equal round reactive to light and accommodation. Extraocular motion intact. FOV normal. Vision grossly normal. Ears: External  ear exam reveals no significant lesions or deformities. Canals clear .TMs normal. Hearing is grossly normal bilaterally. Nose: External nasal exam reveals no deformity or inflammation. Nasal mucosa are pink and moist. No lesions or exudates noted.   Mouth: Oral mucosa and oropharynx reveal no lesions or exudates. Teeth in good repair. Neck: No deformities, masses, or tenderness noted. Range of motion &. Thyroid normal. Lungs: Normal respiratory effort; chest expands symmetrically. Lungs are clear to auscultation without rales, wheezes, or increased work of breathing. Heart: No gallop, click, or rub. No murmur. Abdomen: Bowel sounds normal; abdomen soft and nontender. No masses, organomegaly or hernias noted.                                Musculoskeletal/extremities: No deformity or scoliosis noted of  the thoracic or lumbar spine.  No clubbing, cyanosis, edema, or significant extremity  deformity noted. Range of motion normal .Tone & strength normal. Hand joints normal  Fingernail  health good. Able to lie down & sit up w/o help. Negative SLR bilaterally Vascular: Carotid, radial artery, dorsalis pedis and  posterior tibial pulses are full and equal. No bruits present. Neurologic: Alert and oriented x3. Deep tendon reflexes symmetrical and normal.  Gait normal  including heel & toe walking . Rhomberg & finger to  nose       Skin: Intact without suspicious lesions or rashes. Lymph: No cervical, axillary lymphadenopathy present. Psych: Mood and affect are normal. Normally interactive                                                                                        Assessment & Plan:  #1 transient visual change of blurring  #2  history of atrial fibrillation  #3 elevated triglycerides  Plan: A more extensive cardiovascular evaluation such as Event monitor, 2-D echocardiogram carotid Dopplers will not be pursued unless his Ophthalmologist feels this is indicated after the followup appointment 6/19. He will continue the full dose aspirin  Nutritional interventions were discussed.

## 2014-02-09 NOTE — Progress Notes (Signed)
   Subjective:    Patient ID: Kevin Bradley, male    DOB: 11/29/1945, 68 y.o.   MRN: [id number]  HPI  Patient complains of right eye blurriness that happened on June 1st after dinner. He was in [place] for his job. He felt the eye blurriness was only on the right and is happened for 2 seconds and resolved. Then it happened again for 2 seconds and resolved once more. The blurred vision has not happened since. He also felt lightheaded, weak in the knees, felt anxious, and felt that he had hyperventilated during this. It worried him and he stated all was fine after 15-20 min.   He had visited his eye Dr. 2 weeks prior to this episode for a broken blood vessel in his right eye, which has resolved. He called his eye Dr. After the episode and he was told to increase his use of Systane from 2 to 4 times daily. He will FU with him on 02/23/14.  Review of Systems     Objective:   Physical Exam        Assessment & Plan:

## 2014-02-09 NOTE — Patient Instructions (Signed)
Minimal Blood Pressure Goal= AVERAGE < 140/90;  Ideal is an AVERAGE < 135/85. This AVERAGE should be calculated from @ least 5-7 BP readings taken @ different times of day on different days of week. You should not respond to isolated BP readings , but rather the AVERAGE for that week .Please bring your  blood pressure cuff to office visits to verify that it is reliable.It  can also be checked against the blood pressure device at the pharmacy. Finger or wrist cuffs are not dependable; an arm cuff is.   The most common cause of elevated triglycerides (TG) is the ingestion of sugar from high fructose corn syrup sources added to processed foods & drinks.  Eat a low-fat diet with lots of fruits and vegetables, up to 7-9 servings per day. Consume less than 40 30  Grams (preferably ZERO) of sugar per day from foods & drinks with High Fructose Corn Syrup (HFCS) sugar as #1,2,3 or # 4 on label.[employer], [employer] [place] do not carry products with HFCS.      Keep appointment with Dr Peter Garter.

## 2014-02-09 NOTE — Progress Notes (Signed)
Pre visit review using our clinic review tool, if applicable. No additional management support is needed unless otherwise documented below in the visit note. 

## 2014-02-10 DIAGNOSIS — E781 Pure hyperglyceridemia: Secondary | ICD-10-CM | POA: Insufficient documentation

## 2014-02-12 ENCOUNTER — Telehealth: Payer: Self-pay

## 2014-02-12 NOTE — Telephone Encounter (Signed)
02/09/14 office note has been faxed to Dr Peter Garter at [phone]

## 2014-02-12 NOTE — Telephone Encounter (Signed)
Message copied by Shelly Coss on Mon Feb 12, 2014  8:39 AM ------      Message from: Hendricks Limes      Created: Sat Feb 10, 2014  9:26 AM       Please FAX office visit  to Dr Peter Garter ------

## 2014-03-06 ENCOUNTER — Telehealth: Payer: Self-pay | Admitting: *Deleted

## 2014-03-06 NOTE — Telephone Encounter (Signed)
Bystolic assistance application mailed again to patient from [employer] patient medication assistance.

## 2014-03-29 ENCOUNTER — Encounter: Payer: Self-pay | Admitting: *Deleted

## 2014-03-29 NOTE — Telephone Encounter (Signed)
This encounter was created in error - please disregard.

## 2014-04-04 ENCOUNTER — Other Ambulatory Visit: Payer: Self-pay | Admitting: *Deleted

## 2014-04-04 MED ORDER — NEBIVOLOL HCL 2.5 MG PO TABS: 2.5000 mg | ORAL_TABLET | Freq: Every day | ORAL | Status: DC

## 2014-04-09 ENCOUNTER — Telehealth: Payer: Self-pay | Admitting: Cardiology

## 2014-04-09 NOTE — Telephone Encounter (Signed)
Spoke with pt, aware application has been faxed to the company. We are awaiting their response. Patient voiced understanding

## 2014-04-09 NOTE — Telephone Encounter (Signed)
New Prob    Pt is returning a call regarding a form for Bystolic prescription price reduction. Advised to forward this information to nurse as Lovett Sox is out for a few weeks. Please call.

## 2014-04-26 ENCOUNTER — Telehealth: Payer: Self-pay | Admitting: *Deleted

## 2014-04-26 NOTE — Telephone Encounter (Signed)
Received paperwork from [employer] regarding pt assistance for benicar. The pts income does not meet program eligibility requirements.  Left message for pt to call to see if he needs samples or changed to a different medication.

## 2014-05-15 ENCOUNTER — Encounter: Payer: Self-pay | Admitting: Gastroenterology

## 2014-06-02 ENCOUNTER — Other Ambulatory Visit: Payer: Self-pay | Admitting: Internal Medicine

## 2014-06-13 ENCOUNTER — Telehealth: Payer: Self-pay | Admitting: Internal Medicine

## 2014-06-13 NOTE — Telephone Encounter (Signed)
Patient need prior for meds Benicar 40-12.5 mg before insurance will pay. Please advise

## 2014-06-13 NOTE — Telephone Encounter (Signed)
PA initiated on Tuesday 06/12/14 and now pending insurance decision. Thanks

## 2014-06-14 ENCOUNTER — Telehealth: Payer: Self-pay | Admitting: *Deleted

## 2014-06-14 ENCOUNTER — Telehealth: Payer: Self-pay | Admitting: Internal Medicine

## 2014-06-14 DIAGNOSIS — I10 Essential (primary) hypertension: Secondary | ICD-10-CM

## 2014-06-14 MED ORDER — LOSARTAN POTASSIUM-HCTZ 100-12.5 MG PO TABS: 1.0000 | ORAL_TABLET | Freq: Every day | ORAL | Status: DC

## 2014-06-14 NOTE — Telephone Encounter (Signed)
Patient need priorauthorizatio for Benicar 40/12 Mg and some samples in the meantime. it takes 72 hour for prior to go through.. Please advise

## 2014-06-14 NOTE — Telephone Encounter (Signed)
Changed to a generic 

## 2014-06-14 NOTE — Telephone Encounter (Signed)
Pt called in said that he will be out of meds tomorrow and also wanted to know if there is a anything else he can take that ins will cover and not give him and need a pa for ?  He was requesting a call back

## 2014-06-14 NOTE — Telephone Encounter (Signed)
Called covermymed who advised no update as of yet, will need to call [place] directly at [phone]. Called [employer] spoke with Erline Levine who advised to refax form manually, form refaxed today.

## 2014-06-14 NOTE — Telephone Encounter (Signed)
Left msg on triage stating he has called 3 times about getting PA on his BP med. Pharmacy has fax md information last week, and still haven't heard back. Pt is requesting call back with status on PA...Kevin Bradley

## 2014-06-15 ENCOUNTER — Telehealth: Payer: Self-pay | Admitting: Internal Medicine

## 2014-06-15 NOTE — Telephone Encounter (Signed)
emmi emailed °

## 2014-08-14 ENCOUNTER — Other Ambulatory Visit: Payer: Self-pay | Admitting: Cardiology

## 2014-09-04 ENCOUNTER — Other Ambulatory Visit: Payer: Self-pay | Admitting: Internal Medicine

## 2014-09-08 ENCOUNTER — Other Ambulatory Visit: Payer: Self-pay | Admitting: Internal Medicine

## 2014-09-10 ENCOUNTER — Telehealth: Payer: Self-pay | Admitting: Internal Medicine

## 2014-09-10 MED ORDER — LEVOTHYROXINE SODIUM 50 MCG PO TABS: 50.0000 ug | ORAL_TABLET | Freq: Every day | ORAL | Status: DC

## 2014-09-10 NOTE — Telephone Encounter (Signed)
Pt called in said that he needs refill on thyroid meds.  Pharmacy told him that he needed appt.  He made appt for Friday the 8th but wants Dr Waynard Reeds to call in some for him till he can make it til Friday.

## 2014-09-10 NOTE — Telephone Encounter (Signed)
Notified pt sent week supply until appt...Kevin Bradley

## 2014-09-14 ENCOUNTER — Ambulatory Visit: Payer: Self-pay | Admitting: Internal Medicine

## 2014-09-17 ENCOUNTER — Encounter: Payer: Self-pay | Admitting: Internal Medicine

## 2014-09-17 ENCOUNTER — Ambulatory Visit (INDEPENDENT_AMBULATORY_CARE_PROVIDER_SITE_OTHER): Payer: BLUE CROSS/BLUE SHIELD | Admitting: Internal Medicine

## 2014-09-17 VITALS — BP 138/88 | HR 56 | Temp 98.0°F | Ht 72.0 in | Wt 202.0 lb

## 2014-09-17 DIAGNOSIS — R739 Hyperglycemia, unspecified: Secondary | ICD-10-CM

## 2014-09-17 DIAGNOSIS — E038 Other specified hypothyroidism: Secondary | ICD-10-CM

## 2014-09-17 DIAGNOSIS — I10 Essential (primary) hypertension: Secondary | ICD-10-CM

## 2014-09-17 DIAGNOSIS — E781 Pure hyperglyceridemia: Secondary | ICD-10-CM

## 2014-09-17 DIAGNOSIS — Z23 Encounter for immunization: Secondary | ICD-10-CM

## 2014-09-17 MED ORDER — LOSARTAN POTASSIUM-HCTZ 100-12.5 MG PO TABS: 1.0000 | ORAL_TABLET | Freq: Every day | ORAL | Status: DC

## 2014-09-17 MED ORDER — DILTIAZEM HCL ER COATED BEADS 180 MG PO TB24: 180.0000 mg | ORAL_TABLET | Freq: Every day | ORAL | Status: DC

## 2014-09-17 MED ORDER — LEVOTHYROXINE SODIUM 50 MCG PO TABS
50.0000 ug | ORAL_TABLET | Freq: Every day | ORAL | Status: DC
Start: 2014-09-17 — End: 2015-03-12

## 2014-09-17 NOTE — Assessment & Plan Note (Signed)
He has pre-diabetes Will recheck his A1C today and if needed, will treat

## 2014-09-17 NOTE — Progress Notes (Signed)
Pre visit review using our clinic review tool, if applicable. No additional management support is needed unless otherwise documented below in the visit note. 

## 2014-09-17 NOTE — Progress Notes (Signed)
   Subjective:    Patient ID: Kevin Bradley, male    DOB: 1946-01-21, 69 y.o.   MRN: [id number]  Thyroid Problem Presents for follow-up visit. Patient reports no anxiety, cold intolerance, constipation, depressed mood, diaphoresis, diarrhea, dry skin, fatigue, hair loss, heat intolerance, hoarse voice, leg swelling, nail problem, palpitations, tremors, visual change, weight gain or weight loss. The symptoms have been improving. Past treatments include levothyroxine. The treatment provided significant relief.      Review of Systems  Constitutional: Negative for fever, chills, weight loss, weight gain, diaphoresis, appetite change and fatigue.  HENT: Negative.  Negative for hoarse voice.   Eyes: Negative.   Respiratory: Negative.  Negative for cough, choking, chest tightness, shortness of breath and stridor.   Cardiovascular: Negative.  Negative for chest pain, palpitations and leg swelling.  Gastrointestinal: Negative.  Negative for nausea, vomiting, abdominal pain, diarrhea and constipation.  Endocrine: Negative.  Negative for cold intolerance and heat intolerance.  Genitourinary: Negative.   Musculoskeletal: Negative.   Skin: Negative.   Allergic/Immunologic: Negative.   Neurological: Negative.  Negative for tremors.  Hematological: Negative.  Negative for adenopathy. Does not bruise/bleed easily.  Psychiatric/Behavioral: Negative.  The patient is not nervous/anxious.        Objective:   Physical Exam  Constitutional: He is oriented to person, place, and time. He appears well-developed and well-nourished. No distress.  HENT:  Head: Normocephalic and atraumatic.  Mouth/Throat: Oropharynx is clear and moist. No oropharyngeal exudate.  Eyes: Conjunctivae are normal. Right eye exhibits no discharge. Left eye exhibits no discharge. No scleral icterus.  Neck: Normal range of motion. Neck supple. No JVD present. No tracheal deviation present. No thyromegaly present.  Cardiovascular:  Normal rate, regular rhythm, normal heart sounds and intact distal pulses.  Exam reveals no gallop and no friction rub.   No murmur heard. Pulmonary/Chest: Effort normal and breath sounds normal. No stridor. No respiratory distress. He has no wheezes. He has no rales. He exhibits no tenderness.  Abdominal: Soft. Bowel sounds are normal. He exhibits no distension and no mass. There is no tenderness. There is no rebound and no guarding.  Musculoskeletal: Normal range of motion. He exhibits no edema or tenderness.  Lymphadenopathy:    He has no cervical adenopathy.  Neurological: He is oriented to person, place, and time.  Skin: Skin is warm and dry. No rash noted. He is not diaphoretic. No erythema. No pallor.  Vitals reviewed.     Lab Results  Component Value Date   WBC 7.8 10/16/2013   HGB 17.4* 10/16/2013   HCT 51.2 10/16/2013   PLT 226.0 10/16/2013   GLUCOSE 97 10/16/2013   CHOL 153 10/16/2013   TRIG 247.0* 10/16/2013   HDL 35.30* 10/16/2013   LDLDIRECT 77.8 10/16/2013   LDLCALC 107* 10/17/2012   ALT 20 10/17/2012   AST 15 10/17/2012   NA 139 10/16/2013   K 3.6 10/16/2013   CL 105 10/16/2013   CREATININE 0.9 10/16/2013   BUN 21 10/16/2013   CO2 24 10/16/2013   TSH 3.06 10/16/2013   PSA 2.29 10/17/2012   HGBA1C 5.9 10/17/2012      Assessment & Plan:

## 2014-09-17 NOTE — Assessment & Plan Note (Signed)
His BP is well controlled Will monitor his lytes and renal function 

## 2014-09-17 NOTE — Patient Instructions (Signed)
Hypothyroidism The thyroid is a large gland located in the lower front of your neck. The thyroid gland helps control metabolism. Metabolism is how your body handles food. It controls metabolism with the hormone thyroxine. When this gland is underactive (hypothyroid), it produces too little hormone.  CAUSES These include:   Absence or destruction of thyroid tissue.  Goiter due to iodine deficiency.  Goiter due to medications.  Congenital defects (since birth).  Problems with the pituitary. This causes a lack of TSH (thyroid stimulating hormone). This hormone tells the thyroid to turn out more hormone. SYMPTOMS  Lethargy (feeling as though you have no energy)  Cold intolerance  Weight gain (in spite of normal food intake)  Dry skin  Coarse hair  Menstrual irregularity (if severe, may lead to infertility)  Slowing of thought processes Cardiac problems are also caused by insufficient amounts of thyroid hormone. Hypothyroidism in the newborn is cretinism, and is an extreme form. It is important that this form be treated adequately and immediately or it will lead rapidly to retarded physical and mental development. DIAGNOSIS  To prove hypothyroidism, your caregiver may do blood tests and ultrasound tests. Sometimes the signs are hidden. It may be necessary for your caregiver to watch this illness with blood tests either before or after diagnosis and treatment. TREATMENT  Low levels of thyroid hormone are increased by using synthetic thyroid hormone. This is a safe, effective treatment. It usually takes about four weeks to gain the full effects of the medication. After you have the full effect of the medication, it will generally take another four weeks for problems to leave. Your caregiver may start you on low doses. If you have had heart problems the dose may be gradually increased. It is generally not an emergency to get rapidly to normal. HOME CARE INSTRUCTIONS   Take your  medications as your caregiver suggests. Let your caregiver know of any medications you are taking or start taking. Your caregiver will help you with dosage schedules.  As your condition improves, your dosage needs may increase. It will be necessary to have continuing blood tests as suggested by your caregiver.  Report all suspected medication side effects to your caregiver. SEEK MEDICAL CARE IF: Seek medical care if you develop:  Sweating.  Tremulousness (tremors).  Anxiety.  Rapid weight loss.  Heat intolerance.  Emotional swings.  Diarrhea.  Weakness. SEEK IMMEDIATE MEDICAL CARE IF:  You develop chest pain, an irregular heart beat (palpitations), or a rapid heart beat. MAKE SURE YOU:   Understand these instructions.  Will watch your condition.  Will get help right away if you are not doing well or get worse. Document Released: 08/24/2005 Document Revised: 11/16/2011 Document Reviewed: 04/13/2008 ExitCare Patient Information 2015 [employer]. This information is not intended to replace advice given to you by your health care provider. Make sure you discuss any questions you have with your health care provider.  

## 2014-09-17 NOTE — Assessment & Plan Note (Signed)
His previous trigs were in the 200's He has worked on his lifestyle modifications Will recheck his trigs today, if they are >500 then will treat

## 2014-09-17 NOTE — Assessment & Plan Note (Signed)
I will recheck his TSH and will adjust his dose if needed 

## 2014-09-18 ENCOUNTER — Other Ambulatory Visit (INDEPENDENT_AMBULATORY_CARE_PROVIDER_SITE_OTHER): Payer: BLUE CROSS/BLUE SHIELD

## 2014-09-18 ENCOUNTER — Encounter: Payer: Self-pay | Admitting: Internal Medicine

## 2014-09-18 DIAGNOSIS — E038 Other specified hypothyroidism: Secondary | ICD-10-CM

## 2014-09-18 DIAGNOSIS — R739 Hyperglycemia, unspecified: Secondary | ICD-10-CM

## 2014-09-18 DIAGNOSIS — E781 Pure hyperglyceridemia: Secondary | ICD-10-CM

## 2014-09-18 DIAGNOSIS — I10 Essential (primary) hypertension: Secondary | ICD-10-CM

## 2014-09-18 LAB — COMPREHENSIVE METABOLIC PANEL
ALT: 22 U/L (ref 0–53)
AST: 19 U/L (ref 0–37)
Albumin: 4 g/dL (ref 3.5–5.2)
Alkaline Phosphatase: 58 U/L (ref 39–117)
BUN: 23 mg/dL (ref 6–23)
CALCIUM: 9 mg/dL (ref 8.4–10.5)
CO2: 28 meq/L (ref 19–32)
Chloride: 102 mEq/L (ref 96–112)
Creatinine, Ser: 1 mg/dL (ref 0.4–1.5)
GFR: 80.53 mL/min (ref >60.00)
GLUCOSE: 103 mg/dL — AB (ref 70–99)
POTASSIUM: 4.7 meq/L (ref 3.5–5.1)
SODIUM: 140 meq/L (ref 135–145)
Total Bilirubin: 1.1 mg/dL (ref 0.2–1.2)
Total Protein: 7.1 g/dL (ref 6.0–8.3)

## 2014-09-18 LAB — LIPID PANEL
Cholesterol: 168 mg/dL (ref 0–200)
HDL: 31.4 mg/dL — ABNORMAL LOW (ref >39.00)
LDL CALC: 100 mg/dL — AB (ref 0–99)
NonHDL: 136.6
Total CHOL/HDL Ratio: 5
Triglycerides: 185 mg/dL — ABNORMAL HIGH (ref 0.0–149.0)
VLDL: 37 mg/dL (ref 0.0–40.0)

## 2014-09-18 LAB — CBC WITH DIFFERENTIAL/PLATELET
BASOS ABS: 0 10*3/uL (ref 0.0–0.1)
Basophils Relative: 0.3 % (ref 0.0–3.0)
EOS ABS: 0.2 10*3/uL (ref 0.0–0.7)
Eosinophils Relative: 1.9 % (ref 0.0–5.0)
HCT: 53.5 % — ABNORMAL HIGH (ref 39.0–52.0)
Hemoglobin: 17.9 g/dL — ABNORMAL HIGH (ref 13.0–17.0)
Lymphocytes Relative: 23.9 % (ref 12.0–46.0)
Lymphs Abs: 2.2 10*3/uL (ref 0.7–4.0)
MCHC: 33.4 g/dL (ref 30.0–36.0)
MCV: 91.8 fl (ref 78.0–100.0)
MONO ABS: 0.7 10*3/uL (ref 0.1–1.0)
Monocytes Relative: 8.1 % (ref 3.0–12.0)
NEUTROS ABS: 6 10*3/uL (ref 1.4–7.7)
Neutrophils Relative %: 65.8 % (ref 43.0–77.0)
Platelets: 213 10*3/uL (ref 150.0–400.0)
RBC: 5.82 Mil/uL — AB (ref 4.22–5.81)
RDW: 12.7 % (ref 11.5–15.5)
WBC: 9.1 10*3/uL (ref 4.0–10.5)

## 2014-09-18 LAB — HEMOGLOBIN A1C: Hgb A1c MFr Bld: 6.2 % (ref 4.6–6.5)

## 2014-09-18 LAB — TSH: TSH: 1.74 u[IU]/mL (ref 0.35–4.50)

## 2014-11-16 ENCOUNTER — Encounter: Payer: Self-pay | Admitting: Gastroenterology

## 2014-11-23 ENCOUNTER — Telehealth: Payer: Self-pay

## 2014-11-23 NOTE — Telephone Encounter (Signed)
Not able to leave pt message about flu vaccine

## 2014-12-04 ENCOUNTER — Encounter: Payer: Self-pay | Admitting: Gastroenterology

## 2015-01-19 NOTE — Progress Notes (Signed)
HPI: FU atrial fibrillation. Note TSH in March of 2011 was normal at 1.56. A previous cardioNet monitor revealed sinus rhythm with occasional bursts of atrial fibrillation. He had an episode of chest pain approximately 9 years ago and had a cardiac catheterization that was normal by his report perfomed in [place]. An echocardiogram in May of 2011 revealed normal LV function, trivial aortic insufficiency and mild left atrial enlargement. Patient placed on flecanide previously. A followup stress echocardiogram showed no ischemia and normal LV function. There was a question of septal hypokinesis both at rest and with stress. There was no exercise induced arrhythmias. Cardionet in Aug 2012 showed sinus with PAT but no VT. Previously declined anticoagulants. Abdominal ultrasound in May of 2014 showed no aneurysm. Since I last saw him the patient denies any dyspnea on exertion, orthopnea, PND, pedal edema, palpitations, syncope or chest pain.   Current Outpatient Prescriptions  Medication Sig Dispense Refill  . aspirin EC 325 MG tablet Take 325 mg by mouth daily.      Marland Kitchen diltiazem (CARDIZEM LA) 180 MG 24 hr tablet Take 1 tablet (180 mg total) by mouth daily. 90 tablet 3  . Fish Oil OIL Take 1 tablet by mouth daily.      . flecainide (TAMBOCOR) 50 MG tablet TAKE 2 TABLETS BY MOUTH ONCE DAILY 180 tablet 1  . levothyroxine (SYNTHROID, LEVOTHROID) 50 MCG tablet Take 1 tablet (50 mcg total) by mouth daily. 90 tablet 1  . losartan-hydrochlorothiazide (HYZAAR) 100-12.5 MG per tablet Take 1 tablet by mouth daily. 90 tablet 3  . nebivolol (BYSTOLIC) 2.5 MG tablet Take 1 tablet (2.5 mg total) by mouth daily. 90 tablet 3  . potassium chloride SA (K-DUR,KLOR-CON) 20 MEQ tablet TAKE 1 TABLET BY MOUTH  DAILY    . VITAMIN D, CHOLECALCIFEROL, PO Take 1 tablet by mouth daily.      No current facility-administered medications for this visit.     Past Medical History  Diagnosis Date  . Atrial fibrillation   .  HTN (hypertension)   . External thrombosed hemorrhoids   . Hx of colonic polyps   . BPH (benign prostatic hypertrophy)   . Hypothyroidism   . Anxiety   . Low HDL (under 40)   . Nephrolithiasis   . History of colonoscopy     Past Surgical History  Procedure Laterality Date  . Inguinal hernia repair    . Tonsillectomy    . Thyroidectomy, partial    . Appendectomy      History   Social History  . Marital Status: Married    Spouse Name: N/A  . Number of Children: N/A  . Years of Education: N/A   Occupational History  . Truck Geophysicist/field seismologist    Social History Main Topics  . Smoking status: Former Smoker    Types: Cigars    Quit date: 10/08/2010  . Smokeless tobacco: Never Used  . Alcohol Use: No  . Drug Use: No  . Sexual Activity: Yes   Other Topics Concern  . Not on file   Social History Narrative    ROS: no fevers or chills, productive cough, hemoptysis, dysphasia, odynophagia, melena, hematochezia, dysuria, hematuria, rash, seizure activity, orthopnea, PND, pedal edema, claudication. Remaining systems are negative.  Physical Exam: Well-developed well-nourished in no acute distress.  Skin is warm and dry.  HEENT is normal.  Neck is supple.  Chest is clear to auscultation with normal expansion.  Cardiovascular exam is regular but bradycardic Abdominal exam  nontender or distended. No masses palpated. Extremities show no edema. neuro grossly intact  ECG marked sinus bradycardia at a rate of 43. First-degree AV block. RV conduction delay.

## 2015-01-21 ENCOUNTER — Ambulatory Visit (INDEPENDENT_AMBULATORY_CARE_PROVIDER_SITE_OTHER): Payer: BLUE CROSS/BLUE SHIELD | Admitting: Cardiology

## 2015-01-21 ENCOUNTER — Encounter: Payer: Self-pay | Admitting: Cardiology

## 2015-01-21 VITALS — BP 140/62 | HR 43 | Ht 72.0 in | Wt 202.3 lb

## 2015-01-21 DIAGNOSIS — I480 Paroxysmal atrial fibrillation: Secondary | ICD-10-CM | POA: Diagnosis not present

## 2015-01-21 DIAGNOSIS — I10 Essential (primary) hypertension: Secondary | ICD-10-CM | POA: Diagnosis not present

## 2015-01-21 MED ORDER — AMLODIPINE BESYLATE 10 MG PO TABS: 10.0000 mg | ORAL_TABLET | Freq: Every day | ORAL | Status: DC

## 2015-01-21 NOTE — Patient Instructions (Signed)
Your physician wants you to follow-up in: [place] will receive a reminder letter in the mail two months in advance. If you don't receive a letter, please call our office to schedule the follow-up appointment.   STOP DILTIAZEM  START AMLODIPINE 10 MG ONCE DAILY

## 2015-01-21 NOTE — Assessment & Plan Note (Signed)
Patient is bradycardic. Discontinue Cardizem and treat with Norvasc 10 mg daily.

## 2015-01-21 NOTE — Assessment & Plan Note (Addendum)
Patient remains in sinus rhythm. He has occasional palpitations that sound to be PVCs. Continue flecanide. Continue bystolic but DC cardizem as he is bradycardic. Continue aspirin. He declines Coumadin or new oral anticoagulant agents. He understands the higher risk of CVA.

## 2015-01-21 NOTE — Assessment & Plan Note (Signed)
Patient's blood pressure is reasonably well controlled. However he is bradycardic with heart rate of 43. Discontinue Cardizem. I will continue bystolic for now as he does have problems with occasional "skips" likely PVCs. He also has a history of paroxysmal atrial fibrillation. Add Norvasc 10 mg daily. Follow blood pressure and adjust regimen as needed.

## 2015-02-01 ENCOUNTER — Ambulatory Visit (AMBULATORY_SURGERY_CENTER): Payer: Self-pay | Admitting: *Deleted

## 2015-02-01 VITALS — Ht 72.0 in | Wt 196.8 lb

## 2015-02-01 DIAGNOSIS — Z86010 Personal history of colonic polyps: Secondary | ICD-10-CM

## 2015-02-01 NOTE — Progress Notes (Signed)
No allergies to eggs or soy. No problems with anesthesia.  Pt given Emmi instructions for colonoscopy  No oxygen use  No diet drug use  

## 2015-02-04 ENCOUNTER — Other Ambulatory Visit: Payer: Self-pay | Admitting: Cardiology

## 2015-02-12 ENCOUNTER — Encounter: Payer: Self-pay | Admitting: Gastroenterology

## 2015-02-12 ENCOUNTER — Ambulatory Visit (AMBULATORY_SURGERY_CENTER): Payer: BLUE CROSS/BLUE SHIELD | Admitting: Gastroenterology

## 2015-02-12 VITALS — BP 117/70 | HR 50 | Temp 98.0°F | Resp 14 | Ht 72.0 in | Wt 196.0 lb

## 2015-02-12 DIAGNOSIS — Z860100 Personal history of colon polyps, unspecified: Secondary | ICD-10-CM

## 2015-02-12 DIAGNOSIS — K5730 Diverticulosis of large intestine without perforation or abscess without bleeding: Secondary | ICD-10-CM

## 2015-02-12 MED ORDER — SODIUM CHLORIDE 0.9 % IV SOLN: 500.0000 mL | INTRAVENOUS | Status: DC

## 2015-02-12 NOTE — Op Note (Signed)
[place]  [address] [place], [id number]   COLONOSCOPY PROCEDURE REPORT  PATIENT: Kevin, Bradley  MR#: [id number] BIRTHDATE: 10/27/1945 , 68  yrs. old GENDER: male ENDOSCOPIST: Inda Castle, MD REFERRED CH:ENIDPO Evalina Field, M.D. PROCEDURE DATE:  02/12/2015 PROCEDURE:   Colonoscopy, surveillance First Screening Colonoscopy - Avg.  risk and is 50 yrs.  old or older - No.  Prior Negative Screening - Now for repeat screening. N/A  History of Adenoma - Now for follow-up colonoscopy & has been > or = to 3 yrs.  Yes hx of adenoma.  Has been 3 or more years since last colonoscopy.  Polyps removed today? No Recommend repeat exam, <10 yrs? No ASA CLASS:   Class II INDICATIONS:PH Colon Adenoma. 2011 MEDICATIONS: Monitored anesthesia care and Propofol 200 mg IV  DESCRIPTION OF PROCEDURE:   After the risks benefits and alternatives of the procedure were thoroughly explained, informed consent was obtained.  The digital rectal exam revealed no abnormalities of the rectum.   The LB [id number]  endoscope was introduced through the anus and advanced to the cecum, which was identified by both the appendix and ileocecal valve. No adverse events experienced.   The quality of the prep was (Suprep was used) excellent.  The instrument was then slowly withdrawn as the colon was fully examined. Estimated blood loss is zero unless otherwise noted in this procedure report.      COLON FINDINGS: There was mild diverticulosis noted in the transverse colon, ascending colon, sigmoid colon, and descending colon.   The examination was otherwise normal.  Retroflexed views revealed no abnormalities. The time to cecum =    Withdrawal time = 6:08   The scope was withdrawn and the procedure completed. COMPLICATIONS: There were no immediate complications.  ENDOSCOPIC IMPRESSION: 1.   There was mild diverticulosis noted in the transverse colon, ascending colon, sigmoid colon, and  descending colon 2.   The examination was otherwise normal  RECOMMENDATIONS: Repeat Colonscopy in 10 years.  eSigned:  Inda Castle, MD 02/12/2015 8:52 AM   cc:

## 2015-02-12 NOTE — Progress Notes (Signed)
Report to PACU, RN, vss, BBS= Clear.  

## 2015-02-12 NOTE — Patient Instructions (Signed)
YOU HAD AN ENDOSCOPIC PROCEDURE TODAY AT THE [facility]:   Refer to the procedure report that was given to you for any specific questions about what was found during the examination.  If the procedure report does not answer your questions, please call your gastroenterologist to clarify.  If you requested that your care partner not be given the details of your procedure findings, then the procedure report has been included in a sealed envelope for you to review at your convenience later.  YOU SHOULD EXPECT: Some feelings of bloating in the abdomen. Passage of more gas than usual.  Walking can help get rid of the air that was put into your GI tract during the procedure and reduce the bloating. If you had a lower endoscopy (such as a colonoscopy or flexible sigmoidoscopy) you may notice spotting of blood in your stool or on the toilet paper. If you underwent a bowel prep for your procedure, you may not have a normal bowel movement for a few days.  Please Note:  You might notice some irritation and congestion in your nose or some drainage.  This is from the oxygen used during your procedure.  There is no need for concern and it should clear up in a day or so.  SYMPTOMS TO REPORT IMMEDIATELY:   Following lower endoscopy (colonoscopy or flexible sigmoidoscopy):  Excessive amounts of blood in the stool  Significant tenderness or worsening of abdominal pains  Swelling of the abdomen that is new, acute  Fever of 100F or higher   For urgent or emergent issues, a gastroenterologist can be reached at any hour by calling [phone].   DIET: Your first meal following the procedure should be a small meal and then it is ok to progress to your normal diet. Heavy or fried foods are harder to digest and may make you feel nauseous or bloated.  Likewise, meals heavy in dairy and vegetables can increase bloating.  Drink plenty of fluids but you should avoid alcoholic beverages for 24  hours.  ACTIVITY:  You should plan to take it easy for the rest of today and you should NOT DRIVE or use heavy machinery until tomorrow (because of the sedation medicines used during the test).    FOLLOW UP: Our staff will call the number listed on your records the next business day following your procedure to check on you and address any questions or concerns that you may have regarding the information given to you following your procedure. If we do not reach you, we will leave a message.  However, if you are feeling well and you are not experiencing any problems, there is no need to return our call.  We will assume that you have returned to your regular daily activities without incident.  If any biopsies were taken you will be contacted by phone or by letter within the next 1-3 weeks.  Please call us at [phone] if you have not heard about the biopsies in 3 weeks.    SIGNATURES/CONFIDENTIALITY: You and/or your care partner have signed paperwork which will be entered into your electronic medical record.  These signatures attest to the fact that that the information above on your After Visit Summary has been reviewed and is understood.  Full responsibility of the confidentiality of this discharge information lies with you and/or your care-partner. 

## 2015-02-13 ENCOUNTER — Telehealth: Payer: Self-pay | Admitting: *Deleted

## 2015-02-13 LAB — HM COLONOSCOPY: HM Colonoscopy: NORMAL

## 2015-02-13 NOTE — Telephone Encounter (Signed)
  Follow up Call-  Call back number 02/12/2015  Post procedure Call Back phone  # [phone]  Permission to leave phone message Yes     Patient questions:  Do you have a fever, pain , or abdominal swelling? No. Pain Score  0 *  Have you tolerated food without any problems? Yes.    Have you been able to return to your normal activities? Yes.    Do you have any questions about your discharge instructions: Diet   No. Medications  No. Follow up visit  No.  Do you have questions or concerns about your Care? No.  Actions: * If pain score is 4 or above: No action needed, pain <4.

## 2015-02-14 ENCOUNTER — Other Ambulatory Visit: Payer: Self-pay | Admitting: Cardiology

## 2015-03-04 ENCOUNTER — Telehealth: Payer: Self-pay | Admitting: Cardiology

## 2015-03-04 NOTE — Telephone Encounter (Signed)
New Message      Pt c/o medication issue:  1. Name of Medication: Asprin  2. How are you currently taking this medication (dosage and times per day)? 1X A DAY   Asprin to be reduced or stopped for 2 weeks  3. Are you having a reaction (difficulty breathing--STAT)? No, pt has been diagnosed wit a Blood vessel hemorrhage i  4. What is your medication issue? Dr. Peter Garter [facility]

## 2015-03-04 NOTE — Telephone Encounter (Signed)
Routed to MD

## 2015-03-05 NOTE — Telephone Encounter (Signed)
Ok to hold asa for 2 weeks Kevin Bradley

## 2015-03-06 NOTE — Telephone Encounter (Signed)
Spoke with pt, Aware of dr crenshaw's recommendations.  °

## 2015-03-12 ENCOUNTER — Other Ambulatory Visit: Payer: Self-pay | Admitting: Internal Medicine

## 2015-04-20 ENCOUNTER — Other Ambulatory Visit: Payer: Self-pay | Admitting: Internal Medicine

## 2015-05-21 ENCOUNTER — Other Ambulatory Visit: Payer: Self-pay | Admitting: Internal Medicine

## 2015-06-10 ENCOUNTER — Other Ambulatory Visit: Payer: Self-pay | Admitting: Internal Medicine

## 2015-09-07 ENCOUNTER — Other Ambulatory Visit: Payer: Self-pay | Admitting: Internal Medicine

## 2015-09-16 ENCOUNTER — Ambulatory Visit (INDEPENDENT_AMBULATORY_CARE_PROVIDER_SITE_OTHER): Payer: BLUE CROSS/BLUE SHIELD | Admitting: Internal Medicine

## 2015-09-16 ENCOUNTER — Encounter: Payer: Self-pay | Admitting: Internal Medicine

## 2015-09-16 VITALS — BP 136/78 | HR 64 | Temp 97.7°F | Resp 16 | Ht 72.0 in | Wt 204.0 lb

## 2015-09-16 DIAGNOSIS — I10 Essential (primary) hypertension: Secondary | ICD-10-CM | POA: Diagnosis not present

## 2015-09-16 DIAGNOSIS — R739 Hyperglycemia, unspecified: Secondary | ICD-10-CM

## 2015-09-16 DIAGNOSIS — E038 Other specified hypothyroidism: Secondary | ICD-10-CM

## 2015-09-16 DIAGNOSIS — E781 Pure hyperglyceridemia: Secondary | ICD-10-CM | POA: Diagnosis not present

## 2015-09-16 DIAGNOSIS — E785 Hyperlipidemia, unspecified: Secondary | ICD-10-CM

## 2015-09-16 MED ORDER — LEVOTHYROXINE SODIUM 50 MCG PO TABS: 50.0000 ug | ORAL_TABLET | Freq: Every day | ORAL | Status: DC

## 2015-09-16 NOTE — Progress Notes (Signed)
Pre visit review using our clinic review tool, if applicable. No additional management support is needed unless otherwise documented below in the visit note. 

## 2015-09-16 NOTE — Progress Notes (Signed)
Subjective:  Patient ID: Kevin Bradley, male    DOB: 12-Nov-1945  Age: 70 y.o. MRN: [id number]  CC: Hypertension and Hypothyroidism   HPI Kevin Bradley presents for f/up - he feels well and offers no complaints.  Outpatient Prescriptions Prior to Visit  Medication Sig Dispense Refill  . amLODipine (NORVASC) 10 MG tablet Take 1 tablet (10 mg total) by mouth daily. 180 tablet 3  . aspirin EC 325 MG tablet Take 325 mg by mouth daily.      Marland Kitchen BYSTOLIC 2.5 MG tablet TAKE 1 TABLET BY MOUTH ONCE DAILY 90 tablet 2  . Fish Oil OIL Take 1 tablet by mouth daily.      . flecainide (TAMBOCOR) 50 MG tablet TAKE 2 TABLETS BY MOUTH ONCE A DAY 180 tablet 3  . potassium chloride SA (K-DUR,KLOR-CON) 20 MEQ tablet TAKE ONE TABLET BY MOUTH TWICE DAILY 180 tablet 1  . VITAMIN D, CHOLECALCIFEROL, PO Take 1 tablet by mouth daily.     Marland Kitchen levothyroxine (SYNTHROID, LEVOTHROID) 50 MCG tablet TAKE 1 TABLET BY MOUTH DAILY 90 tablet 0  . nebivolol (BYSTOLIC) 2.5 MG tablet Take 1 tablet (2.5 mg total) by mouth daily. 90 tablet 3  . olmesartan-hydrochlorothiazide (BENICAR HCT) 40-12.5 MG per tablet Take 1 tablet by mouth daily.    . potassium chloride SA (K-DUR,KLOR-CON) 20 MEQ tablet TAKE 1 TABLET BY MOUTH  DAILY     No facility-administered medications prior to visit.    ROS Review of Systems  Constitutional: Negative.  Negative for fever, chills, diaphoresis, appetite change and fatigue.  HENT: Negative.   Eyes: Negative.   Respiratory: Negative.  Negative for cough, choking, chest tightness, shortness of breath and stridor.   Cardiovascular: Negative.  Negative for chest pain, palpitations and leg swelling.  Gastrointestinal: Negative.  Negative for nausea, vomiting, abdominal pain, diarrhea, constipation and blood in stool.  Endocrine: Negative.   Genitourinary: Negative.   Musculoskeletal: Negative.  Negative for myalgias, back pain, joint swelling and arthralgias.  Skin: Negative.  Negative for color  change and rash.  Allergic/Immunologic: Negative.   Neurological: Negative.  Negative for dizziness, tremors, weakness, light-headedness, numbness and headaches.  Hematological: Negative.  Negative for adenopathy. Does not bruise/bleed easily.  Psychiatric/Behavioral: Negative.     Objective:  BP 136/78 mmHg  Pulse 64  Temp(Src) 97.7 F (36.5 C) (Oral)  Resp 16  Ht 6' (1.829 m)  Wt 204 lb (92.534 kg)  BMI 27.66 kg/m2  SpO2 97%  BP Readings from Last 3 Encounters:  09/16/15 136/78  02/12/15 117/70  01/21/15 140/62    Wt Readings from Last 3 Encounters:  09/16/15 204 lb (92.534 kg)  02/12/15 196 lb (88.905 kg)  02/01/15 196 lb 12.8 oz (89.268 kg)    Physical Exam  Constitutional: He is oriented to person, place, and time. No distress.  HENT:  Mouth/Throat: Oropharynx is clear and moist. No oropharyngeal exudate.  Eyes: Conjunctivae are normal. Right eye exhibits no discharge. Left eye exhibits no discharge. No scleral icterus.  Neck: Normal range of motion. Neck supple. No JVD present. No tracheal deviation present. No thyromegaly present.  Cardiovascular: Normal rate, regular rhythm, normal heart sounds and intact distal pulses.  Exam reveals no gallop and no friction rub.   No murmur heard. Pulmonary/Chest: Effort normal and breath sounds normal. No stridor. No respiratory distress. He has no wheezes. He has no rales. He exhibits no tenderness.  Abdominal: Soft. Bowel sounds are normal. He exhibits no distension and no  mass. There is no tenderness. There is no rebound and no guarding.  Musculoskeletal: Normal range of motion. He exhibits no edema or tenderness.  Lymphadenopathy:    He has no cervical adenopathy.  Neurological: He is oriented to person, place, and time.  Skin: Skin is warm and dry. No rash noted. He is not diaphoretic. No erythema. No pallor.    Lab Results  Component Value Date   WBC 8.2 09/17/2015   HGB 16.3 09/17/2015   HCT 48.5 09/17/2015   PLT  248.0 09/17/2015   GLUCOSE 102* 09/17/2015   CHOL 158 09/17/2015   TRIG 157.0* 09/17/2015   HDL 36.20* 09/17/2015   LDLDIRECT 77.8 10/16/2013   LDLCALC 90 09/17/2015   ALT 22 09/18/2014   AST 19 09/18/2014   NA 141 09/17/2015   K 4.1 09/17/2015   CL 104 09/17/2015   CREATININE 1.08 09/17/2015   BUN 27* 09/17/2015   CO2 28 09/17/2015   TSH 2.82 09/17/2015   PSA 2.29 10/17/2012   HGBA1C 6.0 09/17/2015    Ct Abdomen Wo Contrast  10/23/2013  CLINICAL DATA:  Followup adrenal adenoma. EXAM: CT ABDOMEN WITHOUT CONTRAST TECHNIQUE: Multidetector CT imaging of the abdomen was performed following the standard protocol without IV contrast. Oral contrast was given. COMPARISON:  07/09/2011 FINDINGS: Lung bases are within normal. Abdominal images demonstrate a normal liver, spleen, pancreas, gallbladder and right adrenal gland. Kidneys are normal in size with no change in a nonobstructing 2 mm stone over the lower pole collecting system of the left kidney. No change in a 9 mm hypodensity over the upper pole right renal cortex too small to characterize but likely a cyst. Visualized ureters are within normal. Minimal diverticulosis of the colon. Minimal calcified plaque over the abdominal aorta. There is no change in a 2.3 x 2.5 cm left adrenal mass with Hounsfield unit measurements less than 0 compatible with an adenoma. There is mild spondylosis of the spine. The most inferior portion of the sagittal reformatted images demonstrate a grade 1 anterolisthesis with associated severe disc space loss over a lower lumbar level incompletely imaged on this exam. IMPRESSION: Stable left adrenal adenoma measuring 2.3 x 2.5 cm. Nonobstructing 2 mm left renal stone. 9 mm right renal hypodensity too small to characterize but likely a cyst and unchanged. Minimal diverticulosis of the colon. Grade 1 anterolisthesis with associated severe disc space narrowing over a lower lumbar level incompletely imaged on this exam.  Electronically Signed   By: Marin Olp M.D.   On: 10/23/2013 10:18    Assessment & Plan:   Kevin Bradley was seen today for hypertension and hypothyroidism.  Diagnoses and all orders for this visit:  Essential hypertension- his blood pressure is well-controlled, lites and renal function are stable. -     CBC with Differential/Platelet; Future -     Basic metabolic panel; Future  Other specified hypothyroidism- his TSH is in the normal range, he will stay on the current dose of Synthroid -     CBC with Differential/Platelet; Future -     TSH; Future -     levothyroxine (SYNTHROID, LEVOTHROID) 50 MCG tablet; Take 1 tablet (50 mcg total) by mouth daily.  Hypertriglyceridemia- improvement noted -     Lipid panel; Future  Hyperglycemia- he has mild prediabetes, he does not need a medication for this, he agrees to continue working on his lifestyle modifications. -     Basic metabolic panel; Future -     Hemoglobin A1c; Future  Hyperlipidemia  with target LDL less than 100- his Framingham risk score is over 30% so I have asked him to start taking a statin. -     pravastatin (PRAVACHOL) 20 MG tablet; Take 1 tablet (20 mg total) by mouth daily.  Other orders -     Cancel: levothyroxine (SYNTHROID, LEVOTHROID) 50 MCG tablet; Take 1 tablet (50 mcg total) by mouth daily. -     Discontinue: levothyroxine (SYNTHROID, LEVOTHROID) 50 MCG tablet; Take 1 tablet (50 mcg total) by mouth daily.   I have discontinued Mr. Azpeitia nebivolol and olmesartan-hydrochlorothiazide. I am also having him start on pravastatin. Additionally, I am having him maintain his aspirin EC, Fish Oil, (VITAMIN D, CHOLECALCIFEROL, PO), amLODipine, flecainide, BYSTOLIC, potassium chloride SA, losartan-hydrochlorothiazide, and levothyroxine.  Meds ordered this encounter  Medications  . losartan-hydrochlorothiazide (HYZAAR) 100-12.5 MG tablet    Sig: TK 1 T PO  D    Refill:  3  . DISCONTD: levothyroxine (SYNTHROID, LEVOTHROID)  50 MCG tablet    Sig: Take 1 tablet (50 mcg total) by mouth daily.    Dispense:  10 tablet    Refill:  0  . levothyroxine (SYNTHROID, LEVOTHROID) 50 MCG tablet    Sig: Take 1 tablet (50 mcg total) by mouth daily.    Dispense:  90 tablet    Refill:  1  . pravastatin (PRAVACHOL) 20 MG tablet    Sig: Take 1 tablet (20 mg total) by mouth daily.    Dispense:  90 tablet    Refill:  3     Follow-up: Return in about 6 months (around 03/15/2016).  Scarlette Calico, MD

## 2015-09-16 NOTE — Patient Instructions (Signed)

## 2015-09-17 ENCOUNTER — Encounter: Payer: Self-pay | Admitting: Internal Medicine

## 2015-09-17 ENCOUNTER — Other Ambulatory Visit (INDEPENDENT_AMBULATORY_CARE_PROVIDER_SITE_OTHER): Payer: BLUE CROSS/BLUE SHIELD

## 2015-09-17 DIAGNOSIS — R739 Hyperglycemia, unspecified: Secondary | ICD-10-CM | POA: Diagnosis not present

## 2015-09-17 DIAGNOSIS — E038 Other specified hypothyroidism: Secondary | ICD-10-CM

## 2015-09-17 DIAGNOSIS — E785 Hyperlipidemia, unspecified: Secondary | ICD-10-CM | POA: Insufficient documentation

## 2015-09-17 DIAGNOSIS — E781 Pure hyperglyceridemia: Secondary | ICD-10-CM | POA: Diagnosis not present

## 2015-09-17 DIAGNOSIS — I10 Essential (primary) hypertension: Secondary | ICD-10-CM | POA: Diagnosis not present

## 2015-09-17 LAB — LIPID PANEL
CHOL/HDL RATIO: 4
Cholesterol: 158 mg/dL (ref 0–200)
HDL: 36.2 mg/dL — ABNORMAL LOW (ref >39.00)
LDL CALC: 90 mg/dL (ref 0–99)
NONHDL: 121.65
Triglycerides: 157 mg/dL — ABNORMAL HIGH (ref 0.0–149.0)
VLDL: 31.4 mg/dL (ref 0.0–40.0)

## 2015-09-17 LAB — CBC WITH DIFFERENTIAL/PLATELET
BASOS ABS: 0 10*3/uL (ref 0.0–0.1)
BASOS PCT: 0.5 % (ref 0.0–3.0)
EOS ABS: 0.2 10*3/uL (ref 0.0–0.7)
Eosinophils Relative: 2.5 % (ref 0.0–5.0)
HCT: 48.5 % (ref 39.0–52.0)
HEMOGLOBIN: 16.3 g/dL (ref 13.0–17.0)
Lymphocytes Relative: 26.5 % (ref 12.0–46.0)
Lymphs Abs: 2.2 10*3/uL (ref 0.7–4.0)
MCHC: 33.6 g/dL (ref 30.0–36.0)
MCV: 90.6 fl (ref 78.0–100.0)
MONO ABS: 0.7 10*3/uL (ref 0.1–1.0)
Monocytes Relative: 9.1 % (ref 3.0–12.0)
Neutro Abs: 5 10*3/uL (ref 1.4–7.7)
Neutrophils Relative %: 61.4 % (ref 43.0–77.0)
Platelets: 248 10*3/uL (ref 150.0–400.0)
RBC: 5.36 Mil/uL (ref 4.22–5.81)
RDW: 12.9 % (ref 11.5–15.5)
WBC: 8.2 10*3/uL (ref 4.0–10.5)

## 2015-09-17 LAB — BASIC METABOLIC PANEL
BUN: 27 mg/dL — ABNORMAL HIGH (ref 6–23)
CO2: 28 mEq/L (ref 19–32)
Calcium: 9.5 mg/dL (ref 8.4–10.5)
Chloride: 104 mEq/L (ref 96–112)
Creatinine, Ser: 1.08 mg/dL (ref 0.40–1.50)
GFR: 71.78 mL/min (ref >60.00)
GLUCOSE: 102 mg/dL — AB (ref 70–99)
POTASSIUM: 4.1 meq/L (ref 3.5–5.1)
SODIUM: 141 meq/L (ref 135–145)

## 2015-09-17 LAB — HEMOGLOBIN A1C: Hgb A1c MFr Bld: 6 % (ref 4.6–6.5)

## 2015-09-17 LAB — TSH: TSH: 2.82 u[IU]/mL (ref 0.35–4.50)

## 2015-09-17 MED ORDER — LEVOTHYROXINE SODIUM 50 MCG PO TABS: 50.0000 ug | ORAL_TABLET | Freq: Every day | ORAL | Status: DC

## 2015-09-17 MED ORDER — PRAVASTATIN SODIUM 20 MG PO TABS: 20.0000 mg | ORAL_TABLET | Freq: Every day | ORAL | Status: DC

## 2015-09-17 NOTE — Assessment & Plan Note (Signed)
Framingham risk score is >30%

## 2015-09-20 ENCOUNTER — Telehealth: Payer: Self-pay

## 2015-09-20 NOTE — Telephone Encounter (Signed)
Patient called and is confused as to why he was called in a prescription for pravastatin. He was wanting to know as to why he needed to be taking this medication. He understands that you are out of the office until Monday but he wanted to wait until you came back in the office to hear from you.

## 2015-09-22 NOTE — Telephone Encounter (Signed)
His cholesterol level is slightly elevated and he has a significant risk of heart attack/stroke This medication helps reduce the risk of heart attack/stroke by about 40%

## 2015-09-24 ENCOUNTER — Telehealth: Payer: Self-pay | Admitting: Internal Medicine

## 2015-09-24 DIAGNOSIS — E038 Other specified hypothyroidism: Secondary | ICD-10-CM

## 2015-09-24 MED ORDER — LEVOTHYROXINE SODIUM 50 MCG PO TABS: 50.0000 ug | ORAL_TABLET | Freq: Every day | ORAL | Status: DC

## 2015-09-24 NOTE — Telephone Encounter (Signed)
Notified pt md sent 90 day into [facility] on 1/10. Pt states pharmacy stated they did not receive. Inform pt will resend to [facility]...Kevin Bradley

## 2015-09-24 NOTE — Telephone Encounter (Signed)
Pt request to speak to the assistant concern about his thyroid medication. Pt said that we only send in 10 pill when he was here for the ov, so he has 1 pill left. Please call back

## 2015-10-12 ENCOUNTER — Encounter (HOSPITAL_COMMUNITY): Payer: Self-pay | Admitting: Emergency Medicine

## 2015-10-12 ENCOUNTER — Emergency Department (HOSPITAL_COMMUNITY)
Admission: EM | Admit: 2015-10-12 | Discharge: 2015-10-12 | Disposition: A | Discharge status: Home / Self Care | Payer: BLUE CROSS/BLUE SHIELD | Source: Home / Self Care | Attending: Family Medicine | Admitting: Family Medicine

## 2015-10-12 DIAGNOSIS — J069 Acute upper respiratory infection, unspecified: Secondary | ICD-10-CM

## 2015-10-12 MED ORDER — HYDROCODONE-HOMATROPINE 5-1.5 MG/5ML PO SYRP: 5.0000 mL | ORAL_SOLUTION | Freq: Three times a day (TID) | ORAL | Status: DC | PRN

## 2015-10-12 NOTE — Discharge Instructions (Signed)
Upper Respiratory Infection, Adult Most upper respiratory infections (URIs) are a viral infection of the air passages leading to the lungs. A URI affects the nose, throat, and upper air passages. The most common type of URI is nasopharyngitis and is typically referred to as "the common cold." URIs run their course and usually go away on their own. Most of the time, a URI does not require medical attention, but sometimes a bacterial infection in the upper airways can follow a viral infection. This is called a secondary infection. Sinus and middle ear infections are common types of secondary upper respiratory infections. Bacterial pneumonia can also complicate a URI. A URI can worsen asthma and chronic obstructive pulmonary disease (COPD). Sometimes, these complications can require emergency medical care and may be life threatening.  CAUSES Almost all URIs are caused by viruses. A virus is a type of germ and can spread from one person to another.  RISKS FACTORS You may be at risk for a URI if:   You smoke.   You have chronic heart or lung disease.  You have a weakened defense (immune) system.   You are very young or very old.   You have nasal allergies or asthma.  You work in crowded or poorly ventilated areas.  You work in health care facilities or schools. SIGNS AND SYMPTOMS  Symptoms typically develop 2-3 days after you come in contact with a cold virus. Most viral URIs last 7-10 days. However, viral URIs from the influenza virus (flu virus) can last 14-18 days and are typically more severe. Symptoms may include:   Runny or stuffy (congested) nose.   Sneezing.   Cough.   Sore throat.   Headache.   Fatigue.   Fever.   Loss of appetite.   Pain in your forehead, behind your eyes, and over your cheekbones (sinus pain).  Muscle aches.  DIAGNOSIS  Your health care provider may diagnose a URI by:  Physical exam.  Tests to check that your symptoms are not due to  another condition such as:  Strep throat.  Sinusitis.  Pneumonia.  Asthma. TREATMENT  A URI goes away on its own with time. It cannot be cured with medicines, but medicines may be prescribed or recommended to relieve symptoms. Medicines may help:  Reduce your fever.  Reduce your cough.  Relieve nasal congestion. HOME CARE INSTRUCTIONS   Take medicines only as directed by your health care provider.   Gargle warm saltwater or take cough drops to comfort your throat as directed by your health care provider.  Use a warm mist humidifier or inhale steam from a shower to increase air moisture. This may make it easier to breathe.  Drink enough fluid to keep your urine clear or pale yellow.   Eat soups and other clear broths and maintain good nutrition.   Rest as needed.   Return to work when your temperature has returned to normal or as your health care provider advises. You may need to stay home longer to avoid infecting others. You can also use a face mask and careful hand washing to prevent spread of the virus.  Increase the usage of your inhaler if you have asthma.   Do not use any tobacco products, including cigarettes, chewing tobacco, or electronic cigarettes. If you need help quitting, ask your health care provider. PREVENTION  The best way to protect yourself from getting a cold is to practice good hygiene.   Avoid oral or hand contact with people with cold   symptoms.   Wash your hands often if contact occurs.  There is no clear evidence that vitamin C, vitamin E, echinacea, or exercise reduces the chance of developing a cold. However, it is always recommended to get plenty of rest, exercise, and practice good nutrition.  SEEK MEDICAL CARE IF:   You are getting worse rather than better.   Your symptoms are not controlled by medicine.   You have chills.  You have worsening shortness of breath.  You have brown or red mucus.  You have yellow or brown nasal  discharge.  You have pain in your face, especially when you bend forward.  You have a fever.  You have swollen neck glands.  You have pain while swallowing.  You have white areas in the back of your throat. SEEK IMMEDIATE MEDICAL CARE IF:   You have severe or persistent:  Headache.  Ear pain.  Sinus pain.  Chest pain.  You have chronic lung disease and any of the following:  Wheezing.  Prolonged cough.  Coughing up blood.  A change in your usual mucus.  You have a stiff neck.  You have changes in your:  Vision.  Hearing.  Thinking.  Mood. MAKE SURE YOU:   Understand these instructions.  Will watch your condition.  Will get help right away if you are not doing well or get worse.   This information is not intended to replace advice given to you by your health care provider. Make sure you discuss any questions you have with your health care provider.   Document Released: 02/17/2001 Document Revised: 01/08/2015 Document Reviewed: 11/29/2013 Elsevier Interactive Patient Education 2016 [employer] Inc[employer]  

## 2015-10-12 NOTE — ED Provider Notes (Signed)
CSN: [id number]     Arrival date & time 10/12/15  1726 History   First MD Initiated Contact with Patient 10/12/15 1830     Chief Complaint  Patient presents with  . URI   (Consider location/radiation/quality/duration/timing/severity/associated sxs/prior Treatment) Patient is a 70 y.o. male presenting with URI. The history is provided by the patient. No language interpreter was used.  URI Presenting symptoms: congestion, cough and fever   Presenting symptoms: no sore throat   Presenting symptoms comment:  100.8 x 1 3 hours ago Severity:  Moderate Onset quality:  Gradual Duration:  12 hours Timing:  Constant Progression:  Unchanged Chronicity:  New Relieved by:  Nothing Worsened by:  Nothing tried Ineffective treatments:  None tried Associated symptoms: headaches, sinus pain and wheezing   Risk factors: being elderly   Risk factors: no recent illness, no recent travel and no sick contacts     Past Medical History  Diagnosis Date  . Atrial fibrillation ([place])   . HTN (hypertension)   . External thrombosed hemorrhoids   . Hx of colonic polyps   . BPH (benign prostatic hypertrophy)   . Hypothyroidism   . Anxiety   . Low HDL (under 40)   . Nephrolithiasis   . History of colonoscopy    Past Surgical History  Procedure Laterality Date  . Inguinal hernia repair Right 1990  . Tonsillectomy  1948  . Thyroidectomy, partial  1990  . Appendectomy    . Inguinal hernia repair Left 1948  . Cyst tongue  1990    benign   Family History  Problem Relation Age of Onset  . Alcohol abuse    . Breast cancer    . Heart attack Father   . Cancer Neg Hx   . Early death Neg Hx   . Diabetes Neg Hx   . Heart disease Neg Hx   . Hyperlipidemia Neg Hx   . Hypertension Neg Hx   . Kidney disease Neg Hx   . Stroke Neg Hx   . Colon cancer Neg Hx    Social History  Substance Use Topics  . Smoking status: Current Some Day Smoker    Types: Cigars  . Smokeless tobacco: Never Used  . Alcohol  Use: No    Review of Systems  Constitutional: Positive for fever.  HENT: Positive for congestion. Negative for sore throat.   Respiratory: Positive for cough and wheezing.   Cardiovascular: Negative.   Neurological: Positive for headaches.  All other systems reviewed and are negative.   Allergies  Penicillins  Home Medications   Prior to Admission medications   Medication Sig Start Date End Date Taking? Authorizing Provider  amLODipine (NORVASC) 10 MG tablet Take 1 tablet (10 mg total) by mouth daily. 01/21/15   Lelon Perla, MD  aspirin EC 325 MG tablet Take 325 mg by mouth daily.      Historical Provider, MD  BYSTOLIC 2.5 MG tablet TAKE 1 TABLET BY MOUTH ONCE DAILY 02/14/15   Lelon Perla, MD  Fish Oil OIL Take 1 tablet by mouth daily.      Historical Provider, MD  flecainide (TAMBOCOR) 50 MG tablet TAKE 2 TABLETS BY MOUTH ONCE A DAY 02/06/15   Lelon Perla, MD  levothyroxine (SYNTHROID, LEVOTHROID) 50 MCG tablet Take 1 tablet (50 mcg total) by mouth daily. 09/24/15   Janith Lima, MD  losartan-hydrochlorothiazide (HYZAAR) 100-12.5 MG tablet TK 1 T PO  D 08/11/15   Historical Provider, MD  potassium  chloride SA (K-DUR,KLOR-CON) 20 MEQ tablet TAKE ONE TABLET BY MOUTH TWICE DAILY 04/20/15   Janith Lima, MD  pravastatin (PRAVACHOL) 20 MG tablet Take 1 tablet (20 mg total) by mouth daily. 09/17/15   Janith Lima, MD  VITAMIN D, CHOLECALCIFEROL, PO Take 1 tablet by mouth daily.     Historical Provider, MD   Meds Ordered and Administered this Visit  Medications - No data to display  BP 141/77 mmHg  Pulse 86  Temp(Src) 99.6 F (37.6 C) (Oral)  Resp 18  SpO2 98% No data found.   Physical Exam  Constitutional: He appears well-developed. No distress.  HENT:  Head: Normocephalic.  Right Ear: Tympanic membrane, external ear and ear canal normal.  Left Ear: Tympanic membrane, external ear and ear canal normal.  Mouth/Throat: Uvula is midline and mucous membranes are  normal.  Cardiovascular: Normal rate, regular rhythm and normal heart sounds.   No murmur heard. Pulmonary/Chest: Effort normal and breath sounds normal. No respiratory distress. He has no wheezes. He has no rales. He exhibits no tenderness.  Lymphadenopathy:    He has no cervical adenopathy.  Nursing note and vitals reviewed.   ED Course  Procedures (including critical care time)  Labs Review Labs Reviewed - No data to display  Imaging Review No results found.   Visual Acuity Review  Right Eye Distance:   Left Eye Distance:   Bilateral Distance:    Right Eye Near:   Left Eye Near:    Bilateral Near:         MDM  No diagnosis found. Upper respiratory infection  Pulmonary exam and HEENT exam completely benign. Likely viral illness. Patient reassured that antibiotic is not needed at this time. Hycodan prescribed for cough and congestion. I recommended rest at home and keep self well hydrated. Return precaution discussed.    Kinnie Feil, MD 10/12/15 1840

## 2015-10-12 NOTE — ED Notes (Signed)
Pt here with possible acute URI Sx's cough, congestion with brownish phlegm, chills, fever and slight scratchy throat Started last night with temp 100.8, no otc meds taken

## 2015-11-09 ENCOUNTER — Other Ambulatory Visit: Payer: Self-pay | Admitting: Internal Medicine

## 2015-12-02 ENCOUNTER — Other Ambulatory Visit: Payer: Self-pay | Admitting: Cardiology

## 2016-01-19 ENCOUNTER — Other Ambulatory Visit: Payer: Self-pay | Admitting: Internal Medicine

## 2016-01-20 ENCOUNTER — Encounter: Payer: Self-pay | Admitting: *Deleted

## 2016-01-20 NOTE — Progress Notes (Signed)
HPI: FU atrial fibrillation. A previous cardioNet monitor revealed sinus rhythm with occasional bursts of atrial fibrillation. He had an episode of chest pain approximately 9 years ago and had a cardiac catheterization that was normal by his report perfomed in [place]. An echocardiogram in May of 2011 revealed normal LV function, trivial aortic insufficiency and mild left atrial enlargement. Patient placed on flecanide previously. A followup stress echocardiogram showed no ischemia and normal LV function. There was a question of septal hypokinesis both at rest and with stress. There was no exercise induced arrhythmias. Cardionet in Aug 2012 showed sinus with PAT but no VT. Previously declined anticoagulants. Abdominal ultrasound in May of 2014 showed no aneurysm. Since I last saw him He denies dyspnea, chest pain, or syncope. Occasional brief flutters but no sustained palpitations.  Current Outpatient Prescriptions  Medication Sig Dispense Refill  . amLODipine (NORVASC) 10 MG tablet Take 1 tablet (10 mg total) by mouth daily. 180 tablet 3  . aspirin EC 325 MG tablet Take 325 mg by mouth daily.      Marland Kitchen BYSTOLIC 2.5 MG tablet TAKE 1 TABLET BY MOUTH EVERY DAY 90 tablet 1  . Fish Oil OIL Take 1 tablet by mouth daily.      . flecainide (TAMBOCOR) 50 MG tablet TAKE 2 TABLETS BY MOUTH ONCE A DAY 180 tablet 3  . levothyroxine (SYNTHROID, LEVOTHROID) 50 MCG tablet Take 1 tablet (50 mcg total) by mouth daily. 90 tablet 1  . losartan-hydrochlorothiazide (HYZAAR) 100-12.5 MG tablet TK 1 T PO  D  3  . losartan-hydrochlorothiazide (HYZAAR) 100-12.5 MG tablet TAKE 1 TABLET BY MOUTH DAILY 90 tablet 3  . potassium chloride SA (K-DUR,KLOR-CON) 20 MEQ tablet TAKE ONE TABLET BY MOUTH TWICE DAILY 180 tablet 1  . pravastatin (PRAVACHOL) 20 MG tablet Take 1 tablet (20 mg total) by mouth daily. 90 tablet 3   No current facility-administered medications for this visit.     Past Medical History  Diagnosis Date    . Atrial fibrillation ([place])   . HTN (hypertension)   . External thrombosed hemorrhoids   . Hx of colonic polyps   . BPH (benign prostatic hypertrophy)   . Hypothyroidism   . Anxiety   . Low HDL (under 40)   . Nephrolithiasis   . History of colonoscopy     Past Surgical History  Procedure Laterality Date  . Inguinal hernia repair Right 1990  . Tonsillectomy  1948  . Thyroidectomy, partial  1990  . Appendectomy    . Inguinal hernia repair Left 1948  . Cyst tongue  1990    benign    Social History   Social History  . Marital Status: Married    Spouse Name: N/A  . Number of Children: N/A  . Years of Education: N/A   Occupational History  . Truck Geophysicist/field seismologist    Social History Main Topics  . Smoking status: Current Some Day Smoker    Types: Cigars  . Smokeless tobacco: Never Used  . Alcohol Use: No  . Drug Use: No  . Sexual Activity: Yes   Other Topics Concern  . Not on file   Social History Narrative    Family History  Problem Relation Age of Onset  . Alcohol abuse    . Breast cancer    . Heart attack Father   . Cancer Neg Hx   . Early death Neg Hx   . Diabetes Neg Hx   . Heart disease Neg  Hx   . Hyperlipidemia Neg Hx   . Hypertension Neg Hx   . Kidney disease Neg Hx   . Stroke Neg Hx   . Colon cancer Neg Hx   . AAA (abdominal aortic aneurysm) Father 74    ROS: no fevers or chills, productive cough, hemoptysis, dysphasia, odynophagia, melena, hematochezia, dysuria, hematuria, rash, seizure activity, orthopnea, PND, pedal edema, claudication. Remaining systems are negative.  Physical Exam: Well-developed well-nourished in no acute distress.  Skin is warm and dry.  HEENT is normal.  Neck is supple.  Chest is clear to auscultation with normal expansion.  Cardiovascular exam is regular rate and rhythm.  Abdominal exam nontender or distended. No masses palpated. Extremities show no edema. neuro grossly intact  ECG Marked sinus bradycardia at a rate of  44. First-degree AV block.

## 2016-01-24 ENCOUNTER — Encounter: Payer: Self-pay | Admitting: Cardiology

## 2016-01-24 ENCOUNTER — Ambulatory Visit (INDEPENDENT_AMBULATORY_CARE_PROVIDER_SITE_OTHER): Payer: BLUE CROSS/BLUE SHIELD | Admitting: Cardiology

## 2016-01-24 VITALS — BP 130/70 | HR 44 | Ht 72.0 in | Wt 198.0 lb

## 2016-01-24 DIAGNOSIS — E785 Hyperlipidemia, unspecified: Secondary | ICD-10-CM | POA: Diagnosis not present

## 2016-01-24 DIAGNOSIS — I10 Essential (primary) hypertension: Secondary | ICD-10-CM | POA: Diagnosis not present

## 2016-01-24 DIAGNOSIS — I480 Paroxysmal atrial fibrillation: Secondary | ICD-10-CM

## 2016-01-24 NOTE — Assessment & Plan Note (Signed)
Blood pressure controlled. Continue present medications. 

## 2016-01-24 NOTE — Patient Instructions (Signed)
NO CHANGES WITH CURRENT MEDICATIONS   Your physician wants you to follow-up in 12 MONTHS WITH DR CRENSHAW.  You will receive a reminder letter in the mail two months in advance. If you don't receive a letter, please call our office to schedule the follow-up appointment.     If you need a refill on your cardiac medications before your next appointment, please call your pharmacy.  

## 2016-01-24 NOTE — Assessment & Plan Note (Signed)
Continue statin. 

## 2016-01-24 NOTE — Assessment & Plan Note (Signed)
Patient remains bradycardic. However he is asymptomatic. We discussed discontinuing his by systolic but when he was off beta blockade previously his palpitations worsened. We will continue for now.

## 2016-01-24 NOTE — Assessment & Plan Note (Signed)
Patient remains in sinus rhythm. Continue flecanide. Continue bystolic. Continue aspirin. He declines Coumadin or new oral anticoagulant agents. He understands the higher risk of CVA.

## 2016-01-28 ENCOUNTER — Other Ambulatory Visit: Payer: Self-pay | Admitting: Cardiology

## 2016-01-28 NOTE — Telephone Encounter (Signed)
Rx(s) sent to pharmacy electronically.  

## 2016-03-22 ENCOUNTER — Other Ambulatory Visit: Payer: Self-pay | Admitting: Internal Medicine

## 2016-04-02 DIAGNOSIS — L570 Actinic keratosis: Secondary | ICD-10-CM | POA: Diagnosis not present

## 2016-04-02 DIAGNOSIS — B079 Viral wart, unspecified: Secondary | ICD-10-CM | POA: Diagnosis not present

## 2016-04-02 DIAGNOSIS — C4440 Unspecified malignant neoplasm of skin of scalp and neck: Secondary | ICD-10-CM | POA: Diagnosis not present

## 2016-04-02 DIAGNOSIS — L821 Other seborrheic keratosis: Secondary | ICD-10-CM | POA: Diagnosis not present

## 2016-04-02 DIAGNOSIS — L820 Inflamed seborrheic keratosis: Secondary | ICD-10-CM | POA: Diagnosis not present

## 2016-04-02 DIAGNOSIS — C4441 Basal cell carcinoma of skin of scalp and neck: Secondary | ICD-10-CM | POA: Diagnosis not present

## 2016-04-08 ENCOUNTER — Other Ambulatory Visit: Payer: Self-pay | Admitting: Cardiology

## 2016-04-08 DIAGNOSIS — I480 Paroxysmal atrial fibrillation: Secondary | ICD-10-CM

## 2016-04-08 NOTE — Telephone Encounter (Signed)
Rx request sent to pharmacy.  

## 2016-06-01 ENCOUNTER — Other Ambulatory Visit: Payer: Self-pay | Admitting: Cardiology

## 2016-06-16 DIAGNOSIS — L570 Actinic keratosis: Secondary | ICD-10-CM | POA: Diagnosis not present

## 2016-06-16 DIAGNOSIS — Z85828 Personal history of other malignant neoplasm of skin: Secondary | ICD-10-CM | POA: Diagnosis not present

## 2016-06-16 DIAGNOSIS — L821 Other seborrheic keratosis: Secondary | ICD-10-CM | POA: Diagnosis not present

## 2016-06-16 DIAGNOSIS — L820 Inflamed seborrheic keratosis: Secondary | ICD-10-CM | POA: Diagnosis not present

## 2016-07-18 DIAGNOSIS — Z23 Encounter for immunization: Secondary | ICD-10-CM | POA: Diagnosis not present

## 2016-09-08 DIAGNOSIS — B308 Other viral conjunctivitis: Secondary | ICD-10-CM | POA: Diagnosis not present

## 2016-09-16 ENCOUNTER — Other Ambulatory Visit: Payer: Self-pay | Admitting: Internal Medicine

## 2016-09-16 DIAGNOSIS — E038 Other specified hypothyroidism: Secondary | ICD-10-CM

## 2016-09-17 NOTE — Telephone Encounter (Signed)
This pt needs an appt prior to requesting anymore refills

## 2016-09-28 DIAGNOSIS — H52223 Regular astigmatism, bilateral: Secondary | ICD-10-CM | POA: Diagnosis not present

## 2016-09-28 DIAGNOSIS — H5213 Myopia, bilateral: Secondary | ICD-10-CM | POA: Diagnosis not present

## 2016-09-28 DIAGNOSIS — H524 Presbyopia: Secondary | ICD-10-CM | POA: Diagnosis not present

## 2016-10-17 ENCOUNTER — Other Ambulatory Visit: Payer: Self-pay | Admitting: Internal Medicine

## 2016-10-17 DIAGNOSIS — E038 Other specified hypothyroidism: Secondary | ICD-10-CM

## 2016-10-22 ENCOUNTER — Encounter: Payer: Self-pay | Admitting: Internal Medicine

## 2016-10-22 ENCOUNTER — Other Ambulatory Visit (INDEPENDENT_AMBULATORY_CARE_PROVIDER_SITE_OTHER): Payer: BLUE CROSS/BLUE SHIELD

## 2016-10-22 ENCOUNTER — Ambulatory Visit (INDEPENDENT_AMBULATORY_CARE_PROVIDER_SITE_OTHER): Payer: BLUE CROSS/BLUE SHIELD | Admitting: Internal Medicine

## 2016-10-22 VITALS — BP 116/76 | HR 63 | Temp 98.0°F | Resp 16 | Ht 72.0 in | Wt 202.1 lb

## 2016-10-22 DIAGNOSIS — N400 Enlarged prostate without lower urinary tract symptoms: Secondary | ICD-10-CM

## 2016-10-22 DIAGNOSIS — R7989 Other specified abnormal findings of blood chemistry: Secondary | ICD-10-CM

## 2016-10-22 DIAGNOSIS — R739 Hyperglycemia, unspecified: Secondary | ICD-10-CM | POA: Diagnosis not present

## 2016-10-22 DIAGNOSIS — Z0000 Encounter for general adult medical examination without abnormal findings: Secondary | ICD-10-CM | POA: Diagnosis not present

## 2016-10-22 DIAGNOSIS — I10 Essential (primary) hypertension: Secondary | ICD-10-CM

## 2016-10-22 DIAGNOSIS — E039 Hypothyroidism, unspecified: Secondary | ICD-10-CM

## 2016-10-22 DIAGNOSIS — E038 Other specified hypothyroidism: Secondary | ICD-10-CM

## 2016-10-22 DIAGNOSIS — I480 Paroxysmal atrial fibrillation: Secondary | ICD-10-CM

## 2016-10-22 DIAGNOSIS — E781 Pure hyperglyceridemia: Secondary | ICD-10-CM

## 2016-10-22 DIAGNOSIS — E785 Hyperlipidemia, unspecified: Secondary | ICD-10-CM

## 2016-10-22 LAB — LIPID PANEL
Cholesterol: 157 mg/dL (ref 0–200)
HDL: 36.2 mg/dL — AB (ref >39.00)
NonHDL: 120.33
Total CHOL/HDL Ratio: 4
Triglycerides: 207 mg/dL — ABNORMAL HIGH (ref 0.0–149.0)
VLDL: 41.4 mg/dL — ABNORMAL HIGH (ref 0.0–40.0)

## 2016-10-22 LAB — CBC WITH DIFFERENTIAL/PLATELET
BASOS ABS: 0.1 10*3/uL (ref 0.0–0.1)
Basophils Relative: 0.8 % (ref 0.0–3.0)
EOS ABS: 0.2 10*3/uL (ref 0.0–0.7)
Eosinophils Relative: 1.9 % (ref 0.0–5.0)
HCT: 47.3 % (ref 39.0–52.0)
HEMOGLOBIN: 16.7 g/dL (ref 13.0–17.0)
Lymphocytes Relative: 23.5 % (ref 12.0–46.0)
Lymphs Abs: 2.1 10*3/uL (ref 0.7–4.0)
MCHC: 35.3 g/dL (ref 30.0–36.0)
MCV: 89 fl (ref 78.0–100.0)
MONO ABS: 0.7 10*3/uL (ref 0.1–1.0)
Monocytes Relative: 8.4 % (ref 3.0–12.0)
Neutro Abs: 5.8 10*3/uL (ref 1.4–7.7)
Neutrophils Relative %: 65.4 % (ref 43.0–77.0)
Platelets: 236 10*3/uL (ref 150.0–400.0)
RBC: 5.32 Mil/uL (ref 4.22–5.81)
RDW: 12.8 % (ref 11.5–15.5)
WBC: 8.8 10*3/uL (ref 4.0–10.5)

## 2016-10-22 LAB — URINALYSIS, ROUTINE W REFLEX MICROSCOPIC
Bilirubin Urine: NEGATIVE
Ketones, ur: NEGATIVE
Leukocytes, UA: NEGATIVE
Nitrite: NEGATIVE
Specific Gravity, Urine: 1.025 (ref 1.000–1.030)
Total Protein, Urine: NEGATIVE
URINE GLUCOSE: NEGATIVE
Urobilinogen, UA: 0.2 (ref 0.0–1.0)
pH: 6 (ref 5.0–8.0)

## 2016-10-22 LAB — PSA: PSA: 2.44 ng/mL (ref 0.10–4.00)

## 2016-10-22 LAB — COMPREHENSIVE METABOLIC PANEL
ALBUMIN: 4.2 g/dL (ref 3.5–5.2)
ALK PHOS: 60 U/L (ref 39–117)
ALT: 15 U/L (ref 0–53)
AST: 16 U/L (ref 0–37)
BUN: 19 mg/dL (ref 6–23)
CO2: 30 mEq/L (ref 19–32)
CREATININE: 0.86 mg/dL (ref 0.40–1.50)
Calcium: 9.4 mg/dL (ref 8.4–10.5)
Chloride: 104 mEq/L (ref 96–112)
GFR: 93.07 mL/min (ref >60.00)
Glucose, Bld: 101 mg/dL — ABNORMAL HIGH (ref 70–99)
POTASSIUM: 3.9 meq/L (ref 3.5–5.1)
SODIUM: 139 meq/L (ref 135–145)
Total Bilirubin: 1.4 mg/dL — ABNORMAL HIGH (ref 0.2–1.2)
Total Protein: 6.9 g/dL (ref 6.0–8.3)

## 2016-10-22 LAB — LDL CHOLESTEROL, DIRECT: LDL DIRECT: 95 mg/dL

## 2016-10-22 LAB — HEMOGLOBIN A1C: HEMOGLOBIN A1C: 6.1 % (ref 4.6–6.5)

## 2016-10-22 LAB — TSH: TSH: 1.83 u[IU]/mL (ref 0.35–4.50)

## 2016-10-22 MED ORDER — PRAVASTATIN SODIUM 20 MG PO TABS: 20.0000 mg | ORAL_TABLET | Freq: Every day | ORAL | 3 refills | Status: DC

## 2016-10-22 MED ORDER — LOSARTAN POTASSIUM-HCTZ 100-12.5 MG PO TABS: 1.0000 | ORAL_TABLET | Freq: Every day | ORAL | 1 refills | Status: DC

## 2016-10-22 MED ORDER — LEVOTHYROXINE SODIUM 50 MCG PO TABS: ORAL_TABLET | ORAL | 1 refills | Status: DC

## 2016-10-22 NOTE — Progress Notes (Signed)
Subjective:  Patient ID: Kevin Bradley, male    DOB: 1946-04-04  Age: 71 y.o. MRN: [id number]  CC: Annual Exam; Hypothyroidism; Hypertension; and Hyperlipidemia   HPI COLSYN MCGUFFEY presents for an AWV/CPX.  He tells me his blood pressures well controlled on the combination of Bystolic, amlodipine, an ARB, and the thiazide diuretic. He has had no recent episodes of chest pain, DOE, palpitations, edema, fatigue, or diaphoresis.  Past Medical History:  Diagnosis Date  . Anxiety   . Atrial fibrillation ([place])   . BPH (benign prostatic hypertrophy)   . External thrombosed hemorrhoids   . History of colonoscopy   . HTN (hypertension)   . Hx of colonic polyps   . Hypothyroidism   . Low HDL (under 40)   . Nephrolithiasis    Past Surgical History:  Procedure Laterality Date  . APPENDECTOMY    . cyst tongue  1990   benign  . INGUINAL HERNIA REPAIR Right 1990  . INGUINAL HERNIA REPAIR Left 1948  . THYROIDECTOMY, PARTIAL  1990  . TONSILLECTOMY  1948    reports that he has quit smoking. His smoking use included Cigars. He has never used smokeless tobacco. He reports that he does not drink alcohol or use drugs. family history includes AAA (abdominal aortic aneurysm) (age of onset: 70) in his father; Heart attack in his father. Allergies  Allergen Reactions  . Penicillins     REACTION: passed out    Outpatient Medications Prior to Visit  Medication Sig Dispense Refill  . amLODipine (NORVASC) 10 MG tablet TAKE 1 TABLET BY MOUTH DAILY 180 tablet 3  . aspirin EC 325 MG tablet Take 325 mg by mouth daily.      Marland Kitchen BYSTOLIC 2.5 MG tablet TAKE 1 TABLET BY MOUTH EVERY DAY 90 tablet 3  . Fish Oil OIL Take 1 tablet by mouth daily.      . flecainide (TAMBOCOR) 50 MG tablet TAKE 2 TABLETS BY MOUTH EVERY DAY 180 tablet 2  . potassium chloride SA (K-DUR,KLOR-CON) 20 MEQ tablet TAKE ONE TABLET BY MOUTH TWICE DAILY 180 tablet 1  . levothyroxine (SYNTHROID, LEVOTHROID) 50 MCG tablet TAKE 1  TABLET(50 MCG) BY MOUTH DAILY 30 tablet 0  . losartan-hydrochlorothiazide (HYZAAR) 100-12.5 MG tablet TAKE 1 TABLET BY MOUTH DAILY 90 tablet 3  . pravastatin (PRAVACHOL) 20 MG tablet Take 1 tablet (20 mg total) by mouth daily. 90 tablet 3  . levothyroxine (SYNTHROID, LEVOTHROID) 50 MCG tablet TAKE 1 TABLET BY MOUTH EVERY DAY 90 tablet 1  . losartan-hydrochlorothiazide (HYZAAR) 100-12.5 MG tablet TK 1 T PO  D  3   No facility-administered medications prior to visit.     ROS Review of Systems  Constitutional: Negative.  Negative for appetite change, chills, diaphoresis, fatigue and unexpected weight change.  HENT: Negative.   Respiratory: Negative for cough, chest tightness, shortness of breath and wheezing.   Cardiovascular: Negative for chest pain, palpitations and leg swelling.  Gastrointestinal: Negative.  Negative for abdominal pain, constipation, diarrhea, nausea and vomiting.  Endocrine: Negative.   Genitourinary: Negative.  Negative for difficulty urinating, dysuria, flank pain, frequency, hematuria, penile swelling, scrotal swelling and testicular pain.  Musculoskeletal: Negative.  Negative for back pain, myalgias and neck pain.  Skin: Negative.  Negative for color change and rash.  Allergic/Immunologic: Negative.   Neurological: Negative.  Negative for dizziness, weakness and numbness.  Hematological: Negative for adenopathy. Does not bruise/bleed easily.  Psychiatric/Behavioral: Negative.     Objective:  BP 116/76  Pulse 63   Temp 98 F (36.7 C) (Oral)   Resp 16   Ht 6' (1.829 m)   Wt 202 lb 1.9 oz (91.7 kg)   SpO2 98%   BMI 27.41 kg/m   BP Readings from Last 3 Encounters:  10/22/16 116/76  01/24/16 130/70  10/12/15 141/77    Wt Readings from Last 3 Encounters:  10/22/16 202 lb 1.9 oz (91.7 kg)  01/24/16 198 lb (89.8 kg)  09/16/15 204 lb (92.5 kg)    Physical Exam  Constitutional: He is oriented to person, place, and time. No distress.  HENT:    Mouth/Throat: Oropharynx is clear and moist. No oropharyngeal exudate.  Eyes: Conjunctivae are normal. Right eye exhibits no discharge. Left eye exhibits no discharge. No scleral icterus.  Neck: Normal range of motion. Neck supple. No JVD present. No tracheal deviation present. No thyromegaly present.  Cardiovascular: Normal rate, regular rhythm, S1 normal, S2 normal, normal heart sounds and intact distal pulses.  Exam reveals no gallop.   No murmur heard. Pulmonary/Chest: Effort normal and breath sounds normal. No stridor. No respiratory distress. He has no wheezes. He has no rales. He exhibits no tenderness.  Abdominal: Soft. Bowel sounds are normal. He exhibits no distension and no mass. There is no tenderness. There is no rebound and no guarding. Hernia confirmed negative in the right inguinal area and confirmed negative in the left inguinal area.  Genitourinary: Rectum normal, testes normal and penis normal. Rectal exam shows no external hemorrhoid, no internal hemorrhoid, no fissure, no mass, no tenderness, anal tone normal and guaiac negative stool. Prostate is enlarged (1+ smooth symm BPH). Prostate is not tender. Right testis shows no mass, no swelling and no tenderness. Right testis is descended. Left testis shows no mass, no swelling and no tenderness. Left testis is descended. Circumcised. No penile erythema or penile tenderness. No discharge found.  Musculoskeletal: Normal range of motion. He exhibits no edema, tenderness or deformity.  Lymphadenopathy:    He has no cervical adenopathy.       Right: No inguinal adenopathy present.       Left: No inguinal adenopathy present.  Neurological: He is oriented to person, place, and time.  Skin: Skin is warm and dry. No rash noted. He is not diaphoretic. No erythema. No pallor.  Psychiatric: He has a normal mood and affect. His behavior is normal. Judgment and thought content normal.  Vitals reviewed.   Lab Results  Component Value Date    WBC 8.8 10/22/2016   HGB 16.7 10/22/2016   HCT 47.3 10/22/2016   PLT 236.0 10/22/2016   GLUCOSE 101 (H) 10/22/2016   CHOL 157 10/22/2016   TRIG 207.0 (H) 10/22/2016   HDL 36.20 (L) 10/22/2016   LDLDIRECT 95.0 10/22/2016   LDLCALC 90 09/17/2015   ALT 15 10/22/2016   AST 16 10/22/2016   NA 139 10/22/2016   K 3.9 10/22/2016   CL 104 10/22/2016   CREATININE 0.86 10/22/2016   BUN 19 10/22/2016   CO2 30 10/22/2016   TSH 1.83 10/22/2016   PSA 2.44 10/22/2016   HGBA1C 6.1 10/22/2016    No results found.  Assessment & Plan:   Eddrick was seen today for annual exam, hypothyroidism, hypertension and hyperlipidemia.  Diagnoses and all orders for this visit:  Hypertriglyceridemia- triglycerides are mildly elevated and do not require treatment at this time -     Lipid panel; Future  Hyperlipidemia with target LDL less than 100- he is achieved his LDL  goal is doing well on the statin -     Lipid panel; Future -     pravastatin (PRAVACHOL) 20 MG tablet; Take 1 tablet (20 mg total) by mouth daily.  Hyperglycemia- his A1c is up to 6.1%, he is prediabetic, no medications are needed to treat this. He agrees to work on his lifestyle modifications. -     Hemoglobin A1c; Future  Acquired hypothyroidism- his TSH is in the normal range, he will remain on the current dose of levothyroxine. -     TSH; Future -     levothyroxine (SYNTHROID, LEVOTHROID) 50 MCG tablet; TAKE 1 TABLET(50 MCG) BY MOUTH DAILY  Essential hypertension- his blood pressure is well-controlled, electrolytes and renal function are normal -     Comprehensive metabolic panel; Future -     CBC with Differential/Platelet; Future -     losartan-hydrochlorothiazide (HYZAAR) 100-12.5 MG tablet; Take 1 tablet by mouth daily.  BPH without obstruction/lower urinary tract symptoms- he has no signs or symptoms that need to be treated and his PSA is low so I not concerned about prostate cancer. -     PSA; Future -     Urinalysis,  Routine w reflex microscopic; Future  Paroxysmal atrial fibrillation ([place])- he has good rate and rhythm control, he is not willing to be anticoagulated  Other specified hypothyroidism   I have discontinued Mr. Murga FLUZONE HIGH-DOSE. I am also having him maintain his aspirin EC, Fish Oil, potassium chloride SA, flecainide, amLODipine, BYSTOLIC, losartan-hydrochlorothiazide, pravastatin, and levothyroxine.  Meds ordered this encounter  Medications  . DISCONTD: FLUZONE HIGH-DOSE 0.5 ML SUSY    Sig: ADM 0.5ML IM UTD    Refill:  0  . losartan-hydrochlorothiazide (HYZAAR) 100-12.5 MG tablet    Sig: Take 1 tablet by mouth daily.    Dispense:  90 tablet    Refill:  1  . pravastatin (PRAVACHOL) 20 MG tablet    Sig: Take 1 tablet (20 mg total) by mouth daily.    Dispense:  90 tablet    Refill:  3  . levothyroxine (SYNTHROID, LEVOTHROID) 50 MCG tablet    Sig: TAKE 1 TABLET(50 MCG) BY MOUTH DAILY    Dispense:  90 tablet    Refill:  1   See AVS for instructions about healthy living and anticipatory guidance.  Follow-up: Return in about 6 months (around 04/21/2017).  Scarlette Calico, MD

## 2016-10-22 NOTE — Progress Notes (Signed)
Pre-visit discussion using our clinic review tool. No additional management support is needed unless otherwise documented below in the visit note.  

## 2016-10-22 NOTE — Patient Instructions (Signed)

## 2016-10-24 ENCOUNTER — Other Ambulatory Visit: Payer: Self-pay | Admitting: Cardiology

## 2016-10-24 NOTE — Assessment & Plan Note (Signed)

## 2016-10-26 ENCOUNTER — Encounter: Payer: Self-pay | Admitting: Internal Medicine

## 2016-10-27 NOTE — Telephone Encounter (Signed)
Rx(s) sent to pharmacy electronically.  

## 2016-11-12 ENCOUNTER — Other Ambulatory Visit: Payer: Self-pay | Admitting: Internal Medicine

## 2017-01-08 ENCOUNTER — Other Ambulatory Visit: Payer: Self-pay | Admitting: Internal Medicine

## 2017-01-23 ENCOUNTER — Other Ambulatory Visit: Payer: Self-pay | Admitting: Cardiology

## 2017-01-25 NOTE — Telephone Encounter (Signed)
Rx(s) sent to pharmacy electronically.  

## 2017-03-01 DIAGNOSIS — D1801 Hemangioma of skin and subcutaneous tissue: Secondary | ICD-10-CM | POA: Diagnosis not present

## 2017-03-01 DIAGNOSIS — D225 Melanocytic nevi of trunk: Secondary | ICD-10-CM | POA: Diagnosis not present

## 2017-03-01 DIAGNOSIS — D485 Neoplasm of uncertain behavior of skin: Secondary | ICD-10-CM | POA: Diagnosis not present

## 2017-03-01 DIAGNOSIS — L820 Inflamed seborrheic keratosis: Secondary | ICD-10-CM | POA: Diagnosis not present

## 2017-03-01 DIAGNOSIS — Z85828 Personal history of other malignant neoplasm of skin: Secondary | ICD-10-CM | POA: Diagnosis not present

## 2017-03-01 DIAGNOSIS — L814 Other melanin hyperpigmentation: Secondary | ICD-10-CM | POA: Diagnosis not present

## 2017-03-04 NOTE — Progress Notes (Signed)
HPI: FU atrial fibrillation. A previous cardioNet monitor revealed sinus rhythm with occasional bursts of atrial fibrillation. He had an episode of chest pain approximately 9 years ago and had a cardiac catheterization that was normal by his report perfomed in [place]. An echocardiogram in May of 2011 revealed normal LV function, trivial aortic insufficiency and mild left atrial enlargement. Patient placed on flecanide previously. A followup stress echocardiogram showed no ischemia and normal LV function. There was a question of septal hypokinesis both at rest and with stress. There was no exercise induced arrhythmias. Cardionet in Aug 2012 showed sinus with PAT but no VT. Previously declined anticoagulants. Abdominal ultrasound in May of 2014 showed no aneurysm. Since I last saw him the patient has dyspnea with more extreme activities but not with routine activities. It is relieved with rest. It is not associated with chest pain. There is no orthopnea, PND or pedal edema. There is no syncope. There is no exertional chest pain. Occasional brief palpitations for 1-2 seconds but not sustained.   Current Outpatient Prescriptions  Medication Sig Dispense Refill  . amLODipine (NORVASC) 10 MG tablet TAKE 1 TABLET BY MOUTH DAILY 180 tablet 3  . aspirin EC 325 MG tablet Take 325 mg by mouth daily.      Marland Kitchen BYSTOLIC 2.5 MG tablet TAKE 1 TABLET BY MOUTH EVERY DAY 90 tablet 3  . Fish Oil OIL Take 1 tablet by mouth daily.      . flecainide (TAMBOCOR) 50 MG tablet TAKE 2 TABLETS BY MOUTH EVERY DAY 180 tablet 0  . levothyroxine (SYNTHROID, LEVOTHROID) 50 MCG tablet TAKE 1 TABLET(50 MCG) BY MOUTH DAILY 90 tablet 1  . losartan-hydrochlorothiazide (HYZAAR) 100-12.5 MG tablet Take 1 tablet by mouth daily. 90 tablet 1  . losartan-hydrochlorothiazide (HYZAAR) 100-12.5 MG tablet TAKE 1 TABLET BY MOUTH DAILY 90 tablet 1  . potassium chloride SA (K-DUR,KLOR-CON) 20 MEQ tablet TAKE ONE TABLET BY MOUTH TWICE DAILY 180  tablet 1  . pravastatin (PRAVACHOL) 20 MG tablet Take 1 tablet (20 mg total) by mouth daily. 90 tablet 3   No current facility-administered medications for this visit.      Past Medical History:  Diagnosis Date  . Anxiety   . Atrial fibrillation ([place])   . BPH (benign prostatic hypertrophy)   . External thrombosed hemorrhoids   . History of colonoscopy   . HTN (hypertension)   . Hx of colonic polyps   . Hypothyroidism   . Low HDL (under 40)   . Nephrolithiasis     Past Surgical History:  Procedure Laterality Date  . APPENDECTOMY    . cyst tongue  1990   benign  . INGUINAL HERNIA REPAIR Right 1990  . INGUINAL HERNIA REPAIR Left 1948  . THYROIDECTOMY, PARTIAL  1990  . TONSILLECTOMY  1948    Social History   Social History  . Marital status: Married    Spouse name: N/A  . Number of children: N/A  . Years of education: N/A   Occupational History  . Truck Geophysicist/field seismologist    Social History Main Topics  . Smoking status: Former Smoker    Types: Cigars  . Smokeless tobacco: Never Used  . Alcohol use No  . Drug use: No  . Sexual activity: Yes   Other Topics Concern  . Not on file   Social History Narrative  . No narrative on file    Family History  Problem Relation Age of Onset  . Heart attack Father   .  AAA (abdominal aortic aneurysm) Father 19  . Alcohol abuse Unknown   . Breast cancer Unknown   . Cancer Neg Hx   . Early death Neg Hx   . Diabetes Neg Hx   . Heart disease Neg Hx   . Hyperlipidemia Neg Hx   . Hypertension Neg Hx   . Kidney disease Neg Hx   . Stroke Neg Hx   . Colon cancer Neg Hx     ROS: no fevers or chills, productive cough, hemoptysis, dysphasia, odynophagia, melena, hematochezia, dysuria, hematuria, rash, seizure activity, orthopnea, PND, pedal edema, claudication. Remaining systems are negative.  Physical Exam: Well-developed well-nourished in no acute distress.  Skin is warm and dry.  HEENT is normal.  Neck is supple. No  bruits Chest is clear to auscultation with normal expansion.  Cardiovascular exam is regular rate and rhythm.  Abdominal exam nontender or distended. No masses palpated. Extremities show no edema. neuro grossly intact  ECG- Sinus rhythm at a rate of 56. First-degree AV block. personally reviewed  A/P  1 paroxysmal atrial fibrillation-patient remains in sinus rhythm. Continue flecainide and bystolic. Continue aspirin. He has declined Coumadin/NOACs in the past and understands the higher risk of CVA.  2 hypertension-blood pressure is mildly elevated. However he follows this at home and it is typically controlled. Continue present medications and follow.  3 hyperlipidemia-continue statin.  4 history of bradycardia-heart rate in the 50s today. Continue present dose of bystolic.  Kirk Ruths, MD

## 2017-03-15 ENCOUNTER — Telehealth: Payer: Self-pay | Admitting: Cardiology

## 2017-03-15 DIAGNOSIS — L080 Pyoderma: Secondary | ICD-10-CM | POA: Diagnosis not present

## 2017-03-15 NOTE — Telephone Encounter (Signed)
Spoke with pt, he wants to make sure the z-pak will not be a problem because the warning states it will cause irregular heart beats and he has a hx of atrial fib. Okay given for patient to take, aware we can not really tell what may cause atrial fib to return but hopefully he will be fine. Patient voiced understanding

## 2017-03-15 NOTE — Telephone Encounter (Signed)
New message    Pt c/o medication issue:  1. Name of Medication: 250 mg z pack   2. How are you currently taking this medication (dosage and times per day)? Has not started yet   3. Are you having a reaction (difficulty breathing--STAT)? No   4. What is your medication issue? Medication was given to patient to take - due to infection - foot

## 2017-03-17 ENCOUNTER — Ambulatory Visit (INDEPENDENT_AMBULATORY_CARE_PROVIDER_SITE_OTHER): Payer: BLUE CROSS/BLUE SHIELD | Admitting: Cardiology

## 2017-03-17 ENCOUNTER — Encounter: Payer: Self-pay | Admitting: Cardiology

## 2017-03-17 VITALS — BP 144/90 | HR 56 | Ht 72.0 in | Wt 200.0 lb

## 2017-03-17 DIAGNOSIS — E7800 Pure hypercholesterolemia, unspecified: Secondary | ICD-10-CM | POA: Diagnosis not present

## 2017-03-17 DIAGNOSIS — I480 Paroxysmal atrial fibrillation: Secondary | ICD-10-CM

## 2017-03-17 DIAGNOSIS — I10 Essential (primary) hypertension: Secondary | ICD-10-CM

## 2017-03-17 MED ORDER — NEBIVOLOL HCL 2.5 MG PO TABS
2.5000 mg | ORAL_TABLET | Freq: Every day | ORAL | 3 refills | Status: DC
Start: 2017-03-17 — End: 2018-01-17

## 2017-03-17 NOTE — Patient Instructions (Signed)
Your physician wants you to follow-up in: ONE YEAR WITH DR CRENSHAW You will receive a reminder letter in the mail two months in advance. If you don't receive a letter, please call our office to schedule the follow-up appointment.   If you need a refill on your cardiac medications before your next appointment, please call your pharmacy.  

## 2017-04-13 ENCOUNTER — Other Ambulatory Visit: Payer: Self-pay | Admitting: Internal Medicine

## 2017-04-13 DIAGNOSIS — E039 Hypothyroidism, unspecified: Secondary | ICD-10-CM

## 2017-04-23 ENCOUNTER — Other Ambulatory Visit: Payer: Self-pay | Admitting: Cardiology

## 2017-05-10 ENCOUNTER — Other Ambulatory Visit: Payer: Self-pay | Admitting: Internal Medicine

## 2017-05-10 DIAGNOSIS — I10 Essential (primary) hypertension: Secondary | ICD-10-CM

## 2017-05-27 ENCOUNTER — Other Ambulatory Visit (INDEPENDENT_AMBULATORY_CARE_PROVIDER_SITE_OTHER): Payer: BLUE CROSS/BLUE SHIELD

## 2017-05-27 ENCOUNTER — Ambulatory Visit (INDEPENDENT_AMBULATORY_CARE_PROVIDER_SITE_OTHER): Payer: BLUE CROSS/BLUE SHIELD | Admitting: Internal Medicine

## 2017-05-27 ENCOUNTER — Encounter: Payer: Self-pay | Admitting: Internal Medicine

## 2017-05-27 VITALS — BP 120/78 | HR 76 | Temp 98.0°F | Resp 16 | Ht 72.0 in | Wt 208.6 lb

## 2017-05-27 DIAGNOSIS — I10 Essential (primary) hypertension: Secondary | ICD-10-CM

## 2017-05-27 DIAGNOSIS — E038 Other specified hypothyroidism: Secondary | ICD-10-CM | POA: Diagnosis not present

## 2017-05-27 DIAGNOSIS — E039 Hypothyroidism, unspecified: Secondary | ICD-10-CM | POA: Diagnosis not present

## 2017-05-27 DIAGNOSIS — F40243 Fear of flying: Secondary | ICD-10-CM

## 2017-05-27 MED ORDER — DIAZEPAM 2 MG PO TABS: 2.0000 mg | ORAL_TABLET | Freq: Four times a day (QID) | ORAL | 0 refills | Status: DC | PRN

## 2017-05-27 NOTE — Patient Instructions (Signed)
Hypothyroidism Hypothyroidism is a disorder of the thyroid. The thyroid is a large gland that is located in the lower front of the neck. The thyroid releases hormones that control how the body works. With hypothyroidism, the thyroid does not make enough of these hormones. What are the causes? Causes of hypothyroidism may include:  Viral infections.  Pregnancy.  Your own defense system (immune system) attacking your thyroid.  Certain medicines.  Birth defects.  Past radiation treatments to your head or neck.  Past treatment with radioactive iodine.  Past surgical removal of part or all of your thyroid.  Problems with the gland that is located in the center of your brain (pituitary).  What are the signs or symptoms? Signs and symptoms of hypothyroidism may include:  Feeling as though you have no energy (lethargy).  Inability to tolerate cold.  Weight gain that is not explained by a change in diet or exercise habits.  Dry skin.  Coarse hair.  Menstrual irregularity.  Slowing of thought processes.  Constipation.  Sadness or depression.  How is this diagnosed? Your health care provider may diagnose hypothyroidism with blood tests and ultrasound tests. How is this treated? Hypothyroidism is treated with medicine that replaces the hormones that your body does not make. After you begin treatment, it may take several weeks for symptoms to go away. Follow these instructions at home:  Take medicines only as directed by your health care provider.  If you start taking any new medicines, tell your health care provider.  Keep all follow-up visits as directed by your health care provider. This is important. As your condition improves, your dosage needs may change. You will need to have blood tests regularly so that your health care provider can watch your condition. Contact a health care provider if:  Your symptoms do not get better with treatment.  You are taking thyroid  replacement medicine and: ? You sweat excessively. ? You have tremors. ? You feel anxious. ? You lose weight rapidly. ? You cannot tolerate heat. ? You have emotional swings. ? You have diarrhea. ? You feel weak. Get help right away if:  You develop chest pain.  You develop an irregular heartbeat.  You develop a rapid heartbeat. This information is not intended to replace advice given to you by your health care provider. Make sure you discuss any questions you have with your health care provider. Document Released: 08/24/2005 Document Revised: 01/30/2016 Document Reviewed: 01/09/2014 Elsevier Interactive Patient Education  2017 Elsevier Inc.  

## 2017-05-27 NOTE — Progress Notes (Signed)
Subjective:  Patient ID: Kevin Bradley, male    DOB: 10/28/1945  Age: 71 y.o. MRN: [id number]  CC: Hypertension and Hypothyroidism   HPI Kevin Bradley presents for f/up- he feels well and offers no complaints. He has a fear of flying and requests a refill on a prescription for Valium which she is taking in the past for this.  Outpatient Medications Prior to Visit  Medication Sig Dispense Refill  . amLODipine (NORVASC) 10 MG tablet TAKE 1 TABLET BY MOUTH DAILY 180 tablet 3  . aspirin EC 325 MG tablet Take 325 mg by mouth daily.      . Fish Oil OIL Take 1 tablet by mouth daily.      . flecainide (TAMBOCOR) 50 MG tablet TAKE 2 TABLETS BY MOUTH EVERY DAY 180 tablet 3  . losartan-hydrochlorothiazide (HYZAAR) 100-12.5 MG tablet TAKE 1 TABLET BY MOUTH DAILY 90 tablet 1  . nebivolol (BYSTOLIC) 2.5 MG tablet Take 1 tablet (2.5 mg total) by mouth daily. 90 tablet 3  . potassium chloride SA (K-DUR,KLOR-CON) 20 MEQ tablet TAKE ONE TABLET BY MOUTH TWICE DAILY 180 tablet 1  . pravastatin (PRAVACHOL) 20 MG tablet Take 1 tablet (20 mg total) by mouth daily. 90 tablet 3  . levothyroxine (SYNTHROID, LEVOTHROID) 50 MCG tablet TAKE 1 TABLET(50 MCG) BY MOUTH DAILY 90 tablet 0  . losartan-hydrochlorothiazide (HYZAAR) 100-12.5 MG tablet TAKE 1 TABLET BY MOUTH DAILY 30 tablet 0   No facility-administered medications prior to visit.     ROS Review of Systems  Constitutional: Negative.  Negative for diaphoresis, fatigue and unexpected weight change.  HENT: Negative.   Eyes: Negative.   Respiratory: Negative.  Negative for choking, shortness of breath and wheezing.   Cardiovascular: Negative.  Negative for chest pain, palpitations and leg swelling.  Gastrointestinal: Negative.  Negative for abdominal pain, constipation, diarrhea, nausea and vomiting.  Endocrine: Negative.  Negative for cold intolerance and heat intolerance.  Genitourinary: Negative.  Negative for difficulty urinating and dysuria.    Musculoskeletal: Negative.  Negative for back pain and myalgias.  Skin: Negative.  Negative for color change and rash.  Allergic/Immunologic: Negative.   Neurological: Negative.  Negative for dizziness and weakness.  Hematological: Negative for adenopathy. Does not bruise/bleed easily.  Psychiatric/Behavioral: Negative.     Objective:  BP 120/78 (BP Location: Left Arm, Patient Position: Sitting, Cuff Size: Normal)   Pulse 76   Temp 98 F (36.7 C) (Oral)   Resp 16   Ht 6' (1.829 m)   Wt 208 lb 10 oz (94.6 kg)   SpO2 98%   BMI 28.29 kg/m   BP Readings from Last 3 Encounters:  05/27/17 120/78  03/17/17 (!) 144/90  10/22/16 116/76    Wt Readings from Last 3 Encounters:  05/27/17 208 lb 10 oz (94.6 kg)  03/17/17 200 lb (90.7 kg)  10/22/16 202 lb 1.9 oz (91.7 kg)    Physical Exam  Constitutional: He is oriented to person, place, and time. No distress.  HENT:  Mouth/Throat: Oropharynx is clear and moist. No oropharyngeal exudate.  Eyes: Conjunctivae are normal. Right eye exhibits no discharge. Left eye exhibits no discharge. No scleral icterus.  Neck: Normal range of motion. Neck supple. No JVD present. No thyromegaly present.  Cardiovascular: Normal rate, regular rhythm and intact distal pulses.  Exam reveals no gallop and no friction rub.   No murmur heard. Pulmonary/Chest: Effort normal and breath sounds normal. No respiratory distress. He has no wheezes. He has no rales.  He exhibits no tenderness.  Abdominal: Soft. Bowel sounds are normal. He exhibits no distension and no mass. There is no tenderness. There is no rebound and no guarding.  Musculoskeletal: Normal range of motion. He exhibits no edema, tenderness or deformity.  Lymphadenopathy:    He has no cervical adenopathy.  Neurological: He is alert and oriented to person, place, and time.  Skin: Skin is warm and dry. No rash noted. He is not diaphoretic. No erythema. No pallor.  Vitals reviewed.   Lab Results   Component Value Date   WBC 8.8 10/22/2016   HGB 16.7 10/22/2016   HCT 47.3 10/22/2016   PLT 236.0 10/22/2016   GLUCOSE 105 (H) 05/27/2017   CHOL 157 10/22/2016   TRIG 207.0 (H) 10/22/2016   HDL 36.20 (L) 10/22/2016   LDLDIRECT 95.0 10/22/2016   LDLCALC 90 09/17/2015   ALT 15 10/22/2016   AST 16 10/22/2016   NA 139 05/27/2017   K 4.0 05/27/2017   CL 103 05/27/2017   CREATININE 1.42 05/27/2017   BUN 27 (H) 05/27/2017   CO2 27 05/27/2017   TSH 3.22 05/27/2017   PSA 2.44 10/22/2016   HGBA1C 6.1 10/22/2016    No results found.  Assessment & Plan:   Kevin Bradley was seen today for hypertension and hypothyroidism.  Diagnoses and all orders for this visit:  Essential hypertension- his blood pressure is well-controlled, electrolytes and renal function are normal. -     Basic metabolic panel; Future  Other specified hypothyroidism- his TSH is in the normal range. He will remain on the current dose of levothyroxine. -     TSH; Future  Fear of flying -     diazepam (VALIUM) 2 MG tablet; Take 1 tablet (2 mg total) by mouth every 6 (six) hours as needed for anxiety. Take 1-2 by mouth 1 hour prior to the flight  Acquired hypothyroidism -     levothyroxine (SYNTHROID, LEVOTHROID) 50 MCG tablet; Take 1 tablet (50 mcg total) by mouth daily before breakfast.   I have changed Kevin Bradley levothyroxine. I am also having him start on diazepam. Additionally, I am having him maintain his aspirin EC, Fish Oil, amLODipine, pravastatin, losartan-hydrochlorothiazide, potassium chloride SA, nebivolol, and flecainide.  Meds ordered this encounter  Medications  . diazepam (VALIUM) 2 MG tablet    Sig: Take 1 tablet (2 mg total) by mouth every 6 (six) hours as needed for anxiety. Take 1-2 by mouth 1 hour prior to the flight    Dispense:  30 tablet    Refill:  0  . levothyroxine (SYNTHROID, LEVOTHROID) 50 MCG tablet    Sig: Take 1 tablet (50 mcg total) by mouth daily before breakfast.    Dispense:   90 tablet    Refill:  1     Follow-up: Return in about 6 months (around 11/24/2017).  Scarlette Calico, MD

## 2017-05-28 LAB — BASIC METABOLIC PANEL
BUN: 27 mg/dL — ABNORMAL HIGH (ref 6–23)
CALCIUM: 9.4 mg/dL (ref 8.4–10.5)
CO2: 27 meq/L (ref 19–32)
CREATININE: 1.42 mg/dL (ref 0.40–1.50)
Chloride: 103 mEq/L (ref 96–112)
GFR: 52.09 mL/min — ABNORMAL LOW (ref >60.00)
GLUCOSE: 105 mg/dL — AB (ref 70–99)
Potassium: 4 mEq/L (ref 3.5–5.1)
Sodium: 139 mEq/L (ref 135–145)

## 2017-05-28 LAB — TSH: TSH: 3.22 u[IU]/mL (ref 0.35–4.50)

## 2017-05-28 MED ORDER — LEVOTHYROXINE SODIUM 50 MCG PO TABS: 50.0000 ug | ORAL_TABLET | Freq: Every day | ORAL | 1 refills | Status: DC

## 2017-05-31 ENCOUNTER — Ambulatory Visit: Payer: BLUE CROSS/BLUE SHIELD | Admitting: Internal Medicine

## 2017-05-31 ENCOUNTER — Telehealth: Payer: Self-pay | Admitting: Internal Medicine

## 2017-05-31 NOTE — Telephone Encounter (Signed)
Patient has been informed of labs results. He understood.

## 2017-06-03 ENCOUNTER — Other Ambulatory Visit: Payer: Self-pay | Admitting: Cardiology

## 2017-06-06 ENCOUNTER — Other Ambulatory Visit: Payer: Self-pay | Admitting: Internal Medicine

## 2017-06-06 DIAGNOSIS — I10 Essential (primary) hypertension: Secondary | ICD-10-CM

## 2017-07-03 DIAGNOSIS — Z23 Encounter for immunization: Secondary | ICD-10-CM | POA: Diagnosis not present

## 2017-07-05 ENCOUNTER — Other Ambulatory Visit: Payer: Self-pay | Admitting: Cardiology

## 2017-07-05 DIAGNOSIS — I480 Paroxysmal atrial fibrillation: Secondary | ICD-10-CM

## 2017-07-12 ENCOUNTER — Other Ambulatory Visit: Payer: Self-pay | Admitting: Internal Medicine

## 2017-07-12 DIAGNOSIS — E039 Hypothyroidism, unspecified: Secondary | ICD-10-CM

## 2017-10-08 DIAGNOSIS — B078 Other viral warts: Secondary | ICD-10-CM | POA: Diagnosis not present

## 2017-10-08 DIAGNOSIS — L570 Actinic keratosis: Secondary | ICD-10-CM | POA: Diagnosis not present

## 2017-11-05 DIAGNOSIS — H52223 Regular astigmatism, bilateral: Secondary | ICD-10-CM | POA: Diagnosis not present

## 2017-11-05 DIAGNOSIS — H2513 Age-related nuclear cataract, bilateral: Secondary | ICD-10-CM | POA: Diagnosis not present

## 2017-11-05 DIAGNOSIS — H5213 Myopia, bilateral: Secondary | ICD-10-CM | POA: Diagnosis not present

## 2017-11-05 DIAGNOSIS — H524 Presbyopia: Secondary | ICD-10-CM | POA: Diagnosis not present

## 2017-11-05 DIAGNOSIS — H25013 Cortical age-related cataract, bilateral: Secondary | ICD-10-CM | POA: Diagnosis not present

## 2017-12-06 ENCOUNTER — Other Ambulatory Visit: Payer: Self-pay | Admitting: Internal Medicine

## 2017-12-06 DIAGNOSIS — M5136 Other intervertebral disc degeneration, lumbar region: Secondary | ICD-10-CM | POA: Diagnosis not present

## 2017-12-06 DIAGNOSIS — I10 Essential (primary) hypertension: Secondary | ICD-10-CM

## 2017-12-06 DIAGNOSIS — M4316 Spondylolisthesis, lumbar region: Secondary | ICD-10-CM | POA: Diagnosis not present

## 2017-12-06 DIAGNOSIS — M545 Low back pain: Secondary | ICD-10-CM | POA: Diagnosis not present

## 2017-12-13 ENCOUNTER — Ambulatory Visit: Payer: BLUE CROSS/BLUE SHIELD | Admitting: Internal Medicine

## 2017-12-13 ENCOUNTER — Encounter: Payer: Self-pay | Admitting: Internal Medicine

## 2017-12-13 ENCOUNTER — Other Ambulatory Visit (INDEPENDENT_AMBULATORY_CARE_PROVIDER_SITE_OTHER): Payer: BLUE CROSS/BLUE SHIELD

## 2017-12-13 VITALS — BP 118/66 | HR 64 | Temp 98.1°F | Resp 16 | Wt 208.0 lb

## 2017-12-13 DIAGNOSIS — Z0000 Encounter for general adult medical examination without abnormal findings: Secondary | ICD-10-CM | POA: Diagnosis not present

## 2017-12-13 DIAGNOSIS — E785 Hyperlipidemia, unspecified: Secondary | ICD-10-CM

## 2017-12-13 DIAGNOSIS — N400 Enlarged prostate without lower urinary tract symptoms: Secondary | ICD-10-CM

## 2017-12-13 DIAGNOSIS — R7303 Prediabetes: Secondary | ICD-10-CM

## 2017-12-13 DIAGNOSIS — E559 Vitamin D deficiency, unspecified: Secondary | ICD-10-CM | POA: Diagnosis not present

## 2017-12-13 DIAGNOSIS — D751 Secondary polycythemia: Secondary | ICD-10-CM

## 2017-12-13 DIAGNOSIS — Z1159 Encounter for screening for other viral diseases: Secondary | ICD-10-CM | POA: Insufficient documentation

## 2017-12-13 DIAGNOSIS — I480 Paroxysmal atrial fibrillation: Secondary | ICD-10-CM | POA: Diagnosis not present

## 2017-12-13 DIAGNOSIS — E781 Pure hyperglyceridemia: Secondary | ICD-10-CM

## 2017-12-13 DIAGNOSIS — I10 Essential (primary) hypertension: Secondary | ICD-10-CM | POA: Diagnosis not present

## 2017-12-13 DIAGNOSIS — E032 Hypothyroidism due to medicaments and other exogenous substances: Secondary | ICD-10-CM

## 2017-12-13 LAB — LIPID PANEL
CHOL/HDL RATIO: 3
CHOLESTEROL: 139 mg/dL (ref 0–200)
HDL: 53.2 mg/dL (ref >39.00)
LDL CALC: 59 mg/dL (ref 0–99)
NonHDL: 86.13
Triglycerides: 136 mg/dL (ref 0.0–149.0)
VLDL: 27.2 mg/dL (ref 0.0–40.0)

## 2017-12-13 LAB — CBC WITH DIFFERENTIAL/PLATELET
BASOS PCT: 0.4 % (ref 0.0–3.0)
Basophils Absolute: 0.1 10*3/uL (ref 0.0–0.1)
EOS ABS: 0 10*3/uL (ref 0.0–0.7)
Eosinophils Relative: 0.3 % (ref 0.0–5.0)
HCT: 53.3 % — ABNORMAL HIGH (ref 39.0–52.0)
Lymphocytes Relative: 14 % (ref 12.0–46.0)
Lymphs Abs: 2 10*3/uL (ref 0.7–4.0)
MCHC: 34.6 g/dL (ref 30.0–36.0)
MCV: 90.4 fl (ref 78.0–100.0)
MONO ABS: 1.3 10*3/uL — AB (ref 0.1–1.0)
Monocytes Relative: 9.2 % (ref 3.0–12.0)
Neutro Abs: 10.7 10*3/uL — ABNORMAL HIGH (ref 1.4–7.7)
Neutrophils Relative %: 76.1 % (ref 43.0–77.0)
PLATELETS: 269 10*3/uL (ref 150.0–400.0)
RBC: 5.89 Mil/uL — ABNORMAL HIGH (ref 4.22–5.81)
RDW: 13.4 % (ref 11.5–15.5)
WBC: 14.1 10*3/uL — AB (ref 4.0–10.5)

## 2017-12-13 LAB — COMPREHENSIVE METABOLIC PANEL
ALT: 15 U/L (ref 0–53)
AST: 11 U/L (ref 0–37)
Albumin: 4.1 g/dL (ref 3.5–5.2)
Alkaline Phosphatase: 60 U/L (ref 39–117)
BUN: 25 mg/dL — AB (ref 6–23)
CHLORIDE: 101 meq/L (ref 96–112)
CO2: 28 meq/L (ref 19–32)
CREATININE: 0.87 mg/dL (ref 0.40–1.50)
Calcium: 9.1 mg/dL (ref 8.4–10.5)
GFR: 91.54 mL/min (ref >60.00)
Glucose, Bld: 99 mg/dL (ref 70–99)
Potassium: 3.9 mEq/L (ref 3.5–5.1)
SODIUM: 137 meq/L (ref 135–145)
Total Bilirubin: 0.8 mg/dL (ref 0.2–1.2)
Total Protein: 6.8 g/dL (ref 6.0–8.3)

## 2017-12-13 LAB — VITAMIN D 25 HYDROXY (VIT D DEFICIENCY, FRACTURES): VITD: 26.45 ng/mL — AB (ref 30.00–100.00)

## 2017-12-13 LAB — TSH: TSH: 3.57 u[IU]/mL (ref 0.35–4.50)

## 2017-12-13 LAB — HEMOGLOBIN A1C: Hgb A1c MFr Bld: 6.2 % (ref 4.6–6.5)

## 2017-12-13 LAB — PSA: PSA: 2.53 ng/mL (ref 0.10–4.00)

## 2017-12-13 NOTE — Patient Instructions (Signed)

## 2017-12-13 NOTE — Progress Notes (Signed)
Subjective:  Patient ID: Kevin Bradley, male    DOB: [birth year]  Age: 72 y.o. MRN: [id number]  CC: Hypertension; Hypothyroidism; and Annual Exam   HPI [place] presents for a CPX.  He tells me his blood pressure has been well controlled.  He denies any recent episodes of headache, blurred vision, chest pain, shortness of breath, dizziness, lightheadedness, palpitations, edema, or fatigue.  Past Medical History:  Diagnosis Date  . Anxiety   . Atrial fibrillation ([place])   . BPH (benign prostatic hypertrophy)   . External thrombosed hemorrhoids   . History of colonoscopy   . HTN (hypertension)   . Hx of colonic polyps   . Hypothyroidism   . Low HDL (under 40)   . Nephrolithiasis    Past Surgical History:  Procedure Laterality Date  . APPENDECTOMY    . cyst tongue  1990   benign  . INGUINAL HERNIA REPAIR Right 1990  . INGUINAL HERNIA REPAIR Left 1948  . THYROIDECTOMY, PARTIAL  1990  . TONSILLECTOMY  1948    reports that he has quit smoking. His smoking use included cigars. He has never used smokeless tobacco. He reports that he does not drink alcohol or use drugs. family history includes AAA (abdominal aortic aneurysm) (age of onset: 17) in his father; Alcohol abuse in his unknown relative; Breast cancer in his unknown relative; Heart attack in his father. Allergies  Allergen Reactions  . Penicillins     REACTION: passed out    Outpatient Medications Prior to Visit  Medication Sig Dispense Refill  . amLODipine (NORVASC) 10 MG tablet TAKE 1 TABLET BY MOUTH DAILY 180 tablet 2  . aspirin EC 325 MG tablet Take 325 mg by mouth daily.      Marland Kitchen BYSTOLIC 2.5 MG tablet TAKE 1 TABLET BY MOUTH EVERY DAY 90 tablet 0  . Fish Oil OIL Take 1 tablet by mouth daily.      . flecainide (TAMBOCOR) 50 MG tablet TAKE 2 TABLETS BY MOUTH EVERY DAY 180 tablet 3  . levothyroxine (SYNTHROID, LEVOTHROID) 50 MCG tablet TAKE 1 TABLET(50 MCG) BY MOUTH DAILY 90 tablet 1  .  losartan-hydrochlorothiazide (HYZAAR) 100-12.5 MG tablet TAKE 1 TABLET BY MOUTH DAILY 90 tablet 1  . nebivolol (BYSTOLIC) 2.5 MG tablet Take 1 tablet (2.5 mg total) by mouth daily. 90 tablet 3  . potassium chloride SA (K-DUR,KLOR-CON) 20 MEQ tablet TAKE ONE TABLET BY MOUTH TWICE DAILY 180 tablet 1  . pravastatin (PRAVACHOL) 20 MG tablet Take 1 tablet (20 mg total) by mouth daily. 90 tablet 3  . diazepam (VALIUM) 2 MG tablet Take 1 tablet (2 mg total) by mouth every 6 (six) hours as needed for anxiety. Take 1-2 by mouth 1 hour prior to the flight (Patient not taking: Reported on 12/13/2017) 30 tablet 0  . levothyroxine (SYNTHROID, LEVOTHROID) 50 MCG tablet Take 1 tablet (50 mcg total) by mouth daily before breakfast. 90 tablet 1  . losartan-hydrochlorothiazide (HYZAAR) 100-12.5 MG tablet Take 1 tablet by mouth daily. 90 tablet 1   No facility-administered medications prior to visit.     ROS Review of Systems  Constitutional: Negative.  Negative for appetite change, diaphoresis, fatigue and unexpected weight change.  HENT: Negative.   Eyes: Negative for visual disturbance.  Respiratory: Negative for cough, chest tightness, shortness of breath and wheezing.   Cardiovascular: Negative.  Negative for chest pain, palpitations and leg swelling.  Gastrointestinal: Negative.  Negative for abdominal pain, constipation, diarrhea, nausea and vomiting.  Endocrine: Negative.   Genitourinary: Negative.  Negative for difficulty urinating, penile swelling, scrotal swelling, testicular pain and urgency.  Musculoskeletal: Negative.  Negative for arthralgias, back pain, myalgias and neck pain.  Skin: Negative.  Negative for color change and pallor.  Allergic/Immunologic: Negative.   Neurological: Negative.  Negative for dizziness, weakness, light-headedness and headaches.  Hematological: Negative for adenopathy. Does not bruise/bleed easily.  Psychiatric/Behavioral: Negative.     Objective:  BP 118/66    Pulse 64   Temp 98.1 F (36.7 C)   Resp 16   Wt 208 lb (94.3 kg)   SpO2 98%   BMI 28.21 kg/m   BP Readings from Last 3 Encounters:  12/13/17 118/66  05/27/17 120/78  03/17/17 (!) 144/90    Wt Readings from Last 3 Encounters:  12/13/17 208 lb (94.3 kg)  05/27/17 208 lb 10 oz (94.6 kg)  03/17/17 200 lb (90.7 kg)    Physical Exam  Constitutional: He is oriented to person, place, and time. He appears well-developed and well-nourished. No distress.  HENT:  Head: Normocephalic and atraumatic.  Mouth/Throat: Oropharynx is clear and moist. No oropharyngeal exudate.  Neck: Normal range of motion. Neck supple. No JVD present. No thyromegaly present.  Cardiovascular: Regular rhythm and normal heart sounds. Bradycardia present. Exam reveals no gallop and no friction rub.  No murmur heard. EKG--  Sinus  Bradycardia  -First degree A-V block  PRi = 226 -RSR(V1) -nondiagnostic.   BORDERLINE RHYTHM- no change from the prior EKG   Pulmonary/Chest: Effort normal and breath sounds normal. No stridor. No respiratory distress. He has no wheezes. He has no rales.  Abdominal: Soft. Bowel sounds are normal. He exhibits no distension and no mass. There is no tenderness. No hernia. Hernia confirmed negative in the right inguinal area and confirmed negative in the left inguinal area.  Genitourinary: Rectum normal, testes normal and penis normal. Rectal exam shows no external hemorrhoid, no internal hemorrhoid, no fissure, no mass, no tenderness, anal tone normal and guaiac negative stool. Prostate is enlarged (2+ smooth synn BPH). Prostate is not tender. Right testis shows no mass, no swelling and no tenderness. Left testis shows no mass, no swelling and no tenderness. Circumcised. No penile erythema or penile tenderness. No discharge found.  Musculoskeletal: Normal range of motion. He exhibits no edema or tenderness.  Lymphadenopathy:    He has no cervical adenopathy. No inguinal adenopathy noted on  the right or left side.  Neurological: He is alert and oriented to person, place, and time.  Skin: Skin is warm and dry. He is not diaphoretic.  Vitals reviewed.   Lab Results  Component Value Date   WBC 14.1 (H) 12/13/2017   HGB 18.4 Repeated and verified X2. (HH) 12/13/2017   HCT 53.3 (H) 12/13/2017   PLT 269.0 12/13/2017   GLUCOSE 99 12/13/2017   CHOL 139 12/13/2017   TRIG 136.0 12/13/2017   HDL 53.20 12/13/2017   LDLDIRECT 95.0 10/22/2016   LDLCALC 59 12/13/2017   ALT 15 12/13/2017   AST 11 12/13/2017   NA 137 12/13/2017   K 3.9 12/13/2017   CL 101 12/13/2017   CREATININE 0.87 12/13/2017   BUN 25 (H) 12/13/2017   CO2 28 12/13/2017   TSH 3.57 12/13/2017   PSA 2.53 12/13/2017   HGBA1C 6.2 12/13/2017    No results found.  Assessment & Plan:   Dasan was seen today for hypertension, hypothyroidism and annual exam.  Diagnoses and all orders for this visit:  Essential hypertension-  His blood pressure is well controlled.  Electrolytes and renal function are normal.  EKG is negative for LVH.  I will treat the vitamin D deficiency. -     Comprehensive metabolic panel; Future -     CBC with Differential/Platelet; Future -     VITAMIN D 25 Hydroxy (Vit-D Deficiency, Fractures); Future -     EKG 12-Lead  Paroxysmal atrial fibrillation ([place])- He has good rate and rhythm control.  He has elected not to be anticoagulated. -     TSH; Future  Hypothyroidism due to medication- His TSH is in the normal range.  He will remain on the current dose of levothyroxine. -     TSH; Future  BPH without obstruction/lower urinary tract symptoms- His PSA has not risen so I am not concerned about prostate cancer.  He has no symptoms that need to be treated. -     PSA; Future  Hyperlipidemia with target LDL less than 100- He has achieved his LDL goal and is doing well on the statin. -     Comprehensive metabolic panel; Future -     Lipid panel; Future  Hypertriglyceridemia- Improvement  noted -     Lipid panel; Future  Prediabetes- His A1c is up to 6.2%.  He is prediabetic.  Medical therapy is not indicated. -     Hemoglobin A1c; Future  Need for hepatitis C screening test -     Hepatitis C antibody; Future  Vitamin D deficiency -     Cholecalciferol 2000 units TABS; Take 1 tablet (2,000 Units total) by mouth daily.  Erythrocytosis- He tells me he does not smoke anymore so I am not certain as to the cause for this.  I have asked him to see hematology for further evaluation. -     Ambulatory referral to Hematology  Routine general medical examination at a health care facility   I am having Moishe L. Volanda Napoleon start on Cholecalciferol. I am also having him maintain his aspirin EC, Fish Oil, pravastatin, losartan-hydrochlorothiazide, potassium chloride SA, nebivolol, flecainide, diazepam, BYSTOLIC, amLODipine, and levothyroxine.  Meds ordered this encounter  Medications  . Cholecalciferol 2000 units TABS    Sig: Take 1 tablet (2,000 Units total) by mouth daily.    Dispense:  90 tablet    Refill:  1   See AVS for instructions about healthy living and anticipatory guidance.   Follow-up: Return in about 6 months (around 06/14/2018).  Scarlette Calico, MD

## 2017-12-14 ENCOUNTER — Encounter: Payer: Self-pay | Admitting: Internal Medicine

## 2017-12-14 ENCOUNTER — Telehealth: Payer: Self-pay | Admitting: Internal Medicine

## 2017-12-14 DIAGNOSIS — E559 Vitamin D deficiency, unspecified: Secondary | ICD-10-CM | POA: Insufficient documentation

## 2017-12-14 DIAGNOSIS — D751 Secondary polycythemia: Secondary | ICD-10-CM | POA: Insufficient documentation

## 2017-12-14 LAB — HEPATITIS C ANTIBODY
HEP C AB: NONREACTIVE
SIGNAL TO CUT-OFF: 0.02 (ref <1.00)

## 2017-12-14 MED ORDER — CHOLECALCIFEROL 50 MCG (2000 UT) PO TABS: 1.0000 | ORAL_TABLET | Freq: Every day | ORAL | 1 refills | Status: DC

## 2017-12-14 NOTE — Telephone Encounter (Signed)
Pt given results per notes of Dr. [name] on 12/14/16, patient verbalized understanding. Unable to document in result note due to result note not being routed to [facility].

## 2017-12-15 ENCOUNTER — Other Ambulatory Visit: Payer: Self-pay | Admitting: Internal Medicine

## 2017-12-15 ENCOUNTER — Telehealth: Payer: Self-pay | Admitting: Hematology and Oncology

## 2017-12-15 ENCOUNTER — Encounter: Payer: Self-pay | Admitting: Hematology and Oncology

## 2017-12-15 DIAGNOSIS — I10 Essential (primary) hypertension: Secondary | ICD-10-CM

## 2017-12-15 NOTE — Assessment & Plan Note (Signed)

## 2017-12-15 NOTE — Telephone Encounter (Signed)
Appt has been scheduled for the pt to see Dr. Lebron Conners on 5/1 at 2pm. Appt has been given to the pt's wife. Letter mailed.

## 2018-01-05 ENCOUNTER — Telehealth: Payer: Self-pay | Admitting: Hematology and Oncology

## 2018-01-05 ENCOUNTER — Telehealth: Payer: Self-pay

## 2018-01-05 ENCOUNTER — Inpatient Hospital Stay: Payer: BLUE CROSS/BLUE SHIELD | Attending: Hematology and Oncology | Admitting: Hematology and Oncology

## 2018-01-05 ENCOUNTER — Encounter: Payer: Self-pay | Admitting: Hematology and Oncology

## 2018-01-05 ENCOUNTER — Inpatient Hospital Stay: Payer: BLUE CROSS/BLUE SHIELD

## 2018-01-05 VITALS — BP 139/78 | HR 80 | Temp 98.2°F | Resp 18 | Ht 72.0 in | Wt 199.3 lb

## 2018-01-05 DIAGNOSIS — Z87891 Personal history of nicotine dependence: Secondary | ICD-10-CM | POA: Insufficient documentation

## 2018-01-05 DIAGNOSIS — Z7982 Long term (current) use of aspirin: Secondary | ICD-10-CM | POA: Insufficient documentation

## 2018-01-05 DIAGNOSIS — D751 Secondary polycythemia: Secondary | ICD-10-CM | POA: Insufficient documentation

## 2018-01-05 DIAGNOSIS — D72829 Elevated white blood cell count, unspecified: Secondary | ICD-10-CM | POA: Insufficient documentation

## 2018-01-05 DIAGNOSIS — D582 Other hemoglobinopathies: Secondary | ICD-10-CM

## 2018-01-05 DIAGNOSIS — D72819 Decreased white blood cell count, unspecified: Secondary | ICD-10-CM | POA: Diagnosis not present

## 2018-01-05 DIAGNOSIS — Z79899 Other long term (current) drug therapy: Secondary | ICD-10-CM

## 2018-01-05 LAB — CMP (CANCER CENTER ONLY)
ALT: 16 U/L (ref 0–55)
AST: 17 U/L (ref 5–34)
Albumin: 4.2 g/dL (ref 3.5–5.0)
Alkaline Phosphatase: 75 U/L (ref 40–150)
Anion gap: 10 (ref 3–11)
BUN: 23 mg/dL (ref 7–26)
CHLORIDE: 104 mmol/L (ref 98–109)
CO2: 28 mmol/L (ref 22–29)
Calcium: 9.8 mg/dL (ref 8.4–10.4)
Creatinine: 1.12 mg/dL (ref 0.70–1.30)
GFR, Estimated: >60 mL/min (ref >60)
Glucose, Bld: 152 mg/dL — ABNORMAL HIGH (ref 70–140)
POTASSIUM: 3.9 mmol/L (ref 3.5–5.1)
SODIUM: 142 mmol/L (ref 136–145)
Total Bilirubin: 1 mg/dL (ref 0.2–1.2)
Total Protein: 7.4 g/dL (ref 6.4–8.3)

## 2018-01-05 LAB — CBC WITH DIFFERENTIAL (CANCER CENTER ONLY)
BASOS ABS: 0 10*3/uL (ref 0.0–0.1)
Basophils Relative: 0 %
EOS ABS: 0.2 10*3/uL (ref 0.0–0.5)
EOS PCT: 3 %
HCT: 51 % — ABNORMAL HIGH (ref 38.4–49.9)
Hemoglobin: 18 g/dL — ABNORMAL HIGH (ref 13.0–17.1)
Lymphocytes Relative: 24 %
Lymphs Abs: 1.6 10*3/uL (ref 0.9–3.3)
MCH: 32.1 pg (ref 27.2–33.4)
MCHC: 35.3 g/dL (ref 32.0–36.0)
MCV: 91.1 fL (ref 79.3–98.0)
MONO ABS: 0.7 10*3/uL (ref 0.1–0.9)
Monocytes Relative: 10 %
Neutro Abs: 4.1 10*3/uL (ref 1.5–6.5)
Neutrophils Relative %: 63 %
PLATELETS: 224 10*3/uL (ref 140–400)
RBC: 5.6 MIL/uL (ref 4.20–5.82)
RDW: 13.1 % (ref 11.0–14.6)
WBC: 6.7 10*3/uL (ref 4.0–10.3)

## 2018-01-05 NOTE — Telephone Encounter (Signed)
Printed avs and calender of upcoming appointment.per 5/1 los

## 2018-01-05 NOTE — Telephone Encounter (Signed)
Patient called to reschedule he has jury duty

## 2018-01-06 LAB — ERYTHROPOIETIN: Erythropoietin: 6.1 m[IU]/mL (ref 2.6–18.5)

## 2018-01-11 ENCOUNTER — Other Ambulatory Visit: Payer: Self-pay | Admitting: Internal Medicine

## 2018-01-11 ENCOUNTER — Ambulatory Visit: Payer: BLUE CROSS/BLUE SHIELD | Admitting: Hematology and Oncology

## 2018-01-11 DIAGNOSIS — E039 Hypothyroidism, unspecified: Secondary | ICD-10-CM

## 2018-01-17 ENCOUNTER — Inpatient Hospital Stay (HOSPITAL_BASED_OUTPATIENT_CLINIC_OR_DEPARTMENT_OTHER): Payer: BLUE CROSS/BLUE SHIELD | Admitting: Hematology and Oncology

## 2018-01-17 ENCOUNTER — Telehealth: Payer: Self-pay | Admitting: Hematology and Oncology

## 2018-01-17 ENCOUNTER — Encounter: Payer: Self-pay | Admitting: Hematology and Oncology

## 2018-01-17 VITALS — BP 142/68 | HR 62 | Temp 98.2°F | Resp 17 | Ht 72.0 in | Wt 205.2 lb

## 2018-01-17 DIAGNOSIS — Z87891 Personal history of nicotine dependence: Secondary | ICD-10-CM | POA: Diagnosis not present

## 2018-01-17 DIAGNOSIS — D751 Secondary polycythemia: Secondary | ICD-10-CM

## 2018-01-17 DIAGNOSIS — Z79899 Other long term (current) drug therapy: Secondary | ICD-10-CM | POA: Diagnosis not present

## 2018-01-17 DIAGNOSIS — D72829 Elevated white blood cell count, unspecified: Secondary | ICD-10-CM | POA: Diagnosis not present

## 2018-01-17 DIAGNOSIS — Z7982 Long term (current) use of aspirin: Secondary | ICD-10-CM

## 2018-01-17 DIAGNOSIS — D582 Other hemoglobinopathies: Secondary | ICD-10-CM

## 2018-01-17 NOTE — Telephone Encounter (Signed)
Return if symptoms worsen or fail to improve. Per 5/13 los

## 2018-01-18 DIAGNOSIS — H2511 Age-related nuclear cataract, right eye: Secondary | ICD-10-CM | POA: Diagnosis not present

## 2018-01-18 DIAGNOSIS — H2513 Age-related nuclear cataract, bilateral: Secondary | ICD-10-CM | POA: Diagnosis not present

## 2018-01-19 ENCOUNTER — Telehealth: Payer: Self-pay | Admitting: *Deleted

## 2018-01-19 NOTE — Telephone Encounter (Signed)
  PRIMARY CARDIOLOGIST  : [facility] Pre-operative Risk Assessment    Request for surgical clearance:  What type of surgery is being performed?  CATARACT EXTRACTION W/INTRAOCULAR LENS IMPLANTATION OF THE  RIGHT EYE /LEFT EYE 1. When is this surgery scheduled?  RIGHT EYE  02/11/18  AND  LEFT EYE  02/28/18   2.   3. What type of clearance is required (medical clearance vs. Pharmacy clearance to hold) MEDICAL   4. Are there any medications that need to be held prior to surgery and how long?N/A  5. Practice name and name of physician performing surgery? [facility]  6. What is your office phone number [phone]   7.   What is your office fax number [place]: [facility]  8.   Anesthesia type (None, local, MAC, general) ?  TOPICAL ANESTHESIA W/IV MEDICATION   Raiford Simmonds 01/19/2018, 1:54 PM  _________________________________________________________________   (provider comments below)

## 2018-01-19 NOTE — Telephone Encounter (Signed)
   Primary Cardiologist: Dr Stanford Breed  Chart reviewed as part of pre-operative protocol coverage. Given past medical history and time since last visit, based on ACC/AHA guidelines, HALTON NEAS would be at acceptable risk for the planned procedure without further cardiovascular testing.   I will route this recommendation to the requesting party via Epic fax function and remove from pre-op pool.  Please call with questions.  Kerin Ransom, PA-C 01/19/2018, 4:15 PM

## 2018-01-24 NOTE — Progress Notes (Signed)
[facility] New Visit:  Assessment: Erythrocytosis 72 y.o. male with recent history of long-term tobacco use referred to our clinic for assessment of persistent and possibly progressive erythrocytosis.  Same/most recent hematological profile also shows elevated white blood cell count, but this likely has occurred in the setting of steroid therapy.  At this time, I will conduct standard evaluation for differentiating primary versus secondary erythrocytosis based on elevation or suppression of her erythropoietin level.  Plan: -Labs today as outlined below. -Return to clinic in 1 week to review the findings.   Voice recognition software was used and creation of this note. Despite my best effort at editing the text, some misspelling/errors may have occurred. Orders Placed This Encounter  Procedures  . CBC with Differential (Cancer Center Only)    Standing Status:   Future    Number of Occurrences:   1    Standing Expiration Date:   01/06/2019  . CMP ([facility] only)    Standing Status:   Future    Number of Occurrences:   1    Standing Expiration Date:   01/06/2019  . Erythropoietin    All questions were answered.  . The patient knows to call the clinic with any problems, questions or concerns.  This note was electronically signed.    History of Presenting Illness Kevin Bradley 72 y.o. presenting to the [facility] for elevation of hemoglobin, referred by Dr Janith Lima.  Patient's past medical history significant for anxiety, atrial fibrillation, BPH, hypertension, hypothyroidism, and nephrolithiasis.  Of note, patient has been smoking up until about 1 year ago.  Has not relapsed since.  Does not drink alcohol or use recreational substances.  On additional note, patient has recently completed a 7-day course of prednisone for gout exacerbation, I assume.  At the present time, patient denies any fevers, chills, night sweats.  No unexpected weight loss or  decline in the ability to tolerate activities of daily living.  Patient denies facial flushing, headaches, syncopal events.  Denies shortness of breath, chest pain, or cough.  No skin burning or pruritus.  Denies nausea, vomiting, early satiety, abdominal pain, diarrhea, or constipation.  Oncological/hematological History: --Labs, 05/17/09: WBC   6.6, Hgb 16.9, Hct 50.5, Plt 208 --Labs, 07/11/10: WBC   8.2, Hgb 17.6, Hct 50.5, Plt 229; --Labs, 11/10/10: WBC   8.1, Hgb 18.6, Hct 53.3, Plt 203 --Labs, 09/18/11: WBC   6.9, Hgb 17.8, Hct 51.2, Plt 217 --Labs, 10/17/12: WBC   7.9, Hgb 18.7, Hct     ..., Plt 203 --Labs, 10/16/13: WBC   7.8, Hgb 17.4, Hct 51.2, Plt 226 --Labs, 09/18/14: WBC   9.1, Hgb 17.9, Hct 53.5, Plt 213 --Labs, 09/17/15: WBC   8.2, Hgb 16.3, Hct 48.5, Plt 248; --Labs, 10/22/16: WBC   8.8, Hgb 16.7, Hct 47.3, Plt 236; --Labs, 12/13/17: WBC 14.1, Hgb 18.4, Hct 53.3, Plt 269;    Medical History: Past Medical History:  Diagnosis Date  . Anxiety   . Atrial fibrillation ([place])   . BPH (benign prostatic hypertrophy)   . External thrombosed hemorrhoids   . History of colonoscopy   . HTN (hypertension)   . Hx of colonic polyps   . Hypothyroidism   . Low HDL (under 40)   . Nephrolithiasis     Surgical History: Past Surgical History:  Procedure Laterality Date  . APPENDECTOMY    . cyst tongue  1990   benign  . INGUINAL HERNIA REPAIR Right 1990  .  INGUINAL HERNIA REPAIR Left 1948  . THYROIDECTOMY, PARTIAL  1990  . TONSILLECTOMY  1948    Family History: Family History  Problem Relation Age of Onset  . Heart attack Father   . AAA (abdominal aortic aneurysm) Father 55  . Alcohol abuse Unknown   . Breast cancer Unknown   . Cancer Neg Hx   . Early death Neg Hx   . Diabetes Neg Hx   . Heart disease Neg Hx   . Hyperlipidemia Neg Hx   . Hypertension Neg Hx   . Kidney disease Neg Hx   . Stroke Neg Hx   . Colon cancer Neg Hx     Social History: Social History    Socioeconomic History  . Marital status: Married    Spouse name: Not on file  . Number of children: Not on file  . Years of education: Not on file  . Highest education level: Not on file  Occupational History  . Occupation: [employer] operational officer  . Financial resource strain: Not on file  . Food insecurity:    Worry: Not on file    Inability: Not on file  . Transportation needs:    Medical: Not on file    Non-medical: Not on file  Tobacco Use  . Smoking status: Former Smoker    Types: Cigars    Last attempt to quit: 2017    Years since quitting: 2.3  . Smokeless tobacco: Never Used  Substance and Sexual Activity  . Alcohol use: No    Alcohol/week: 0.0 oz  . Drug use: No  . Sexual activity: Yes  Lifestyle  . Physical activity:    Days per week: Not on file    Minutes per session: Not on file  . Stress: Not on file  Relationships  . Social connections:    Talks on phone: Not on file    Gets together: Not on file    Attends religious service: Not on file    Active member of club or organization: Not on file    Attends meetings of clubs or organizations: Not on file    Relationship status: Not on file  . Intimate partner violence:    Fear of current or ex partner: Not on file    Emotionally abused: Not on file    Physically abused: Not on file    Forced sexual activity: Not on file  Other Topics Concern  . Not on file  Social History Narrative  . Not on file    Allergies: Allergies  Allergen Reactions  . Penicillins     REACTION: passed out    Medications:  Current Outpatient Medications  Medication Sig Dispense Refill  . amLODipine (NORVASC) 10 MG tablet TAKE 1 TABLET BY MOUTH DAILY 180 tablet 2  . aspirin EC 325 MG tablet Take 325 mg by mouth daily.      Marland Kitchen BYSTOLIC 2.5 MG tablet TAKE 1 TABLET BY MOUTH EVERY DAY 90 tablet 0  . Fish Oil OIL Take 1 tablet by mouth daily.      . flecainide (TAMBOCOR) 50 MG tablet TAKE 2 TABLETS BY MOUTH EVERY DAY 180  tablet 3  . potassium chloride SA (K-DUR,KLOR-CON) 20 MEQ tablet TAKE ONE TABLET BY MOUTH TWICE DAILY 180 tablet 1  . levothyroxine (SYNTHROID, LEVOTHROID) 50 MCG tablet TAKE 1 TABLET(50 MCG) BY MOUTH DAILY BEFORE BREAKFAST 90 tablet 1  . losartan-hydrochlorothiazide (HYZAAR) 100-12.5 MG tablet TAKE 1 TABLET BY MOUTH DAILY 90 tablet 1  .  pravastatin (PRAVACHOL) 20 MG tablet Take 1 tablet (20 mg total) by mouth daily. 90 tablet 3   No current facility-administered medications for this visit.     Review of Systems: Review of Systems  All other systems reviewed and are negative.    PHYSICAL EXAMINATION Blood pressure 139/78, pulse 80, temperature 98.2 F (36.8 C), temperature source Oral, resp. rate 18, height 6' (1.829 m), weight 199 lb 4.8 oz (90.4 kg), SpO2 96 %.  ECOG PERFORMANCE STATUS: 1 - Symptomatic but completely ambulatory  Physical Exam  Constitutional: He is oriented to person, place, and time. He appears well-developed and well-nourished. No distress.  HENT:  Head: Normocephalic and atraumatic.  Mouth/Throat: Oropharynx is clear and moist. No oropharyngeal exudate.  Eyes: Pupils are equal, round, and reactive to light. Conjunctivae and EOM are normal. No scleral icterus.  Neck: No thyromegaly present.  Cardiovascular: Normal rate, regular rhythm, normal heart sounds and intact distal pulses. Exam reveals no gallop and no friction rub.  No murmur heard. Pulmonary/Chest: Effort normal and breath sounds normal. No stridor. No respiratory distress. He has no wheezes. He has no rales. He exhibits no tenderness.  Abdominal: Soft. Bowel sounds are normal. He exhibits no distension and no mass. There is no tenderness. There is no guarding.  Musculoskeletal: He exhibits no edema.  Lymphadenopathy:    He has no cervical adenopathy.  Neurological: He is alert and oriented to person, place, and time. He displays normal reflexes. No cranial nerve deficit or sensory deficit.  Skin:  Skin is warm and dry. No rash noted. He is not diaphoretic. No erythema. No pallor.     LABORATORY DATA: I have personally reviewed the data as listed: Office Visit on 01/05/2018  Component Date Value Ref Range Status  . Erythropoietin 01/05/2018 6.1  2.6 - 18.5 mIU/mL Final   Comment: (NOTE) [employer] UniCel DxI [place] obtained with different assay methods or kits cannot be used interchangeably. Results cannot be interpreted as absolute evidence of the presence or absence of malignant disease. Performed At: [facility] [place], [place] [id number] Rush Farmer MD UG:[id number] Performed at [facility], [place] [address], [place], [place] [address]   Appointment on 01/05/2018  Component Date Value Ref Range Status  . WBC Count 01/05/2018 6.7  4.0 - 10.3 K/uL Final  . RBC 01/05/2018 5.60  4.20 - 5.82 MIL/uL Final  . Hemoglobin 01/05/2018 18.0* 13.0 - 17.1 g/dL Final  . HCT 01/05/2018 51.0* 38.4 - 49.9 % Final  . MCV 01/05/2018 91.1  79.3 - 98.0 fL Final  . MCH 01/05/2018 32.1  27.2 - 33.4 pg Final  . MCHC 01/05/2018 35.3  32.0 - 36.0 g/dL Final  . RDW 01/05/2018 13.1  11.0 - 14.6 % Final  . Platelet Count 01/05/2018 224  140 - 400 K/uL Final  . Neutrophils Relative % 01/05/2018 63  % Final  . Neutro Abs 01/05/2018 4.1  1.5 - 6.5 K/uL Final  . Lymphocytes Relative 01/05/2018 24  % Final  . Lymphs Abs 01/05/2018 1.6  0.9 - 3.3 K/uL Final  . Monocytes Relative 01/05/2018 10  % Final  . Monocytes Absolute 01/05/2018 0.7  0.1 - 0.9 K/uL Final  . Eosinophils Relative 01/05/2018 3  % Final  . Eosinophils Absolute 01/05/2018 0.2  0.0 - 0.5 K/uL Final  . Basophils Relative 01/05/2018 0  % Final  . Basophils Absolute 01/05/2018 0.0  0.0 - 0.1 K/uL Final  Performed at [facility], [place] [address] [place], [place] [address]  . Sodium 01/05/2018 142  136 - 145 mmol/L Final  . Potassium  01/05/2018 3.9  3.5 - 5.1 mmol/L Final  . Chloride 01/05/2018 104  98 - 109 mmol/L Final  . CO2 01/05/2018 28  22 - 29 mmol/L Final  . Glucose, Bld 01/05/2018 152* 70 - 140 mg/dL Final  . BUN 01/05/2018 23  7 - 26 mg/dL Final  . Creatinine 01/05/2018 1.12  0.70 - 1.30 mg/dL Final  . Calcium 01/05/2018 9.8  8.4 - 10.4 mg/dL Final  . Total Protein 01/05/2018 7.4  6.4 - 8.3 g/dL Final  . Albumin 01/05/2018 4.2  3.5 - 5.0 g/dL Final  . AST 01/05/2018 17  5 - 34 U/L Final  . ALT 01/05/2018 16  0 - 55 U/L Final  . Alkaline Phosphatase 01/05/2018 75  40 - 150 U/L Final  . Total Bilirubin 01/05/2018 1.0  0.2 - 1.2 mg/dL Final  . GFR, Est Non Af Am 01/05/2018 >60  >60 mL/min Final  . GFR, Est AFR Am 01/05/2018 >60  >60 mL/min Final   Comment: (NOTE) The eGFR has been calculated using the CKD EPI equation. This calculation has not been validated in all clinical situations. eGFR's persistently <60 mL/min signify possible Chronic Kidney Disease.   Georgiann Hahn gap 01/05/2018 10  3 - 11 Final   Performed at [facility], [place] [address] [place], [place] [address]         Ardath Sax, MD

## 2018-01-24 NOTE — Assessment & Plan Note (Signed)
72 y.o. male with recent history of long-term tobacco use referred to our clinic for assessment of persistent and possibly progressive erythrocytosis.  Same/most recent hematological profile also shows elevated white blood cell count, but this likely has occurred in the setting of steroid therapy.  At this time, I will conduct standard evaluation for differentiating primary versus secondary erythrocytosis based on elevation or suppression of her erythropoietin level.  Plan: -Labs today as outlined below. -Return to clinic in 1 week to review the findings.

## 2018-01-25 ENCOUNTER — Other Ambulatory Visit: Payer: Self-pay | Admitting: Internal Medicine

## 2018-01-25 ENCOUNTER — Telehealth: Payer: Self-pay

## 2018-01-25 DIAGNOSIS — R0683 Snoring: Secondary | ICD-10-CM

## 2018-01-25 NOTE — Telephone Encounter (Signed)
Copied from [place] [id number]. Topic: Inquiry >> [name] 25, 2018 11:02 AM Conception Chancy, NT wrote: Reason for CRM:  Patient states he needs to talk to one of Dr. [name] nurse and would not disclose any information with me besides its in regards to the referral he had with the blood specialist.

## 2018-01-25 NOTE — Telephone Encounter (Signed)
1. Have you seen a note requesting pt to see Pulmonary to test for emphysema and also a sleep study test?   2. Pt is planning to have cataract surgery on June 6th and pt would like to make sure that the elevated RBC would not interfer with this procedure?   Please advise.

## 2018-01-25 NOTE — Telephone Encounter (Signed)
1. I have not heard or seen anything about this 2. okay

## 2018-01-27 NOTE — Telephone Encounter (Signed)
Left vm for pt to callback 

## 2018-02-01 NOTE — Telephone Encounter (Signed)
Patient returning Kevin Bradley's call. Call back at [phone]

## 2018-02-02 NOTE — Telephone Encounter (Signed)
Tried to call pt at the number listed. Message stated that pt was not available at this time.   Please ask pt if he is wanting [place] to place those orders. We have not seen any notes regarding the request for Pulmonary referral to test for emphysema or a sleep study.

## 2018-02-02 NOTE — Assessment & Plan Note (Signed)
72 y.o. male with history of long-term tobacco use referred to our clinic for assessment of persistent and possibly progressive erythrocytosis.  Additional lab work demonstrates stable hemoglobin with hematocrit of 51%, without elevation of white blood cell count or platelets.  Erythropoietin level is within normal range.  Primary hematological problem such as myeloproliferative neoplasm is very unlikely in this setting.  My recommendation would be for the primary care provider to evaluate patient for possible underlying COPD or obstructive sleep apnea and treat accordingly.  Plan: -Recommend CBC obtained by primary care provider every 3 months and referral back to hematology for possible therapeutic phlebotomy if hematocrit exceeds 55%.

## 2018-02-02 NOTE — Telephone Encounter (Signed)
Patient is ok with moving forward with referral to Pulmonary dr. [name] to move forward with cataract surgery?

## 2018-02-02 NOTE — Progress Notes (Signed)
[facility] Follow-up Visit:  Assessment: Erythrocytosis 72 y.o. male with history of long-term tobacco use referred to our clinic for assessment of persistent and possibly progressive erythrocytosis.  Additional lab work demonstrates stable hemoglobin with hematocrit of 51%, without elevation of white blood cell count or platelets.  Erythropoietin level is within normal range.  Primary hematological problem such as myeloproliferative neoplasm is very unlikely in this setting.  My recommendation would be for the primary care provider to evaluate patient for possible underlying COPD or obstructive sleep apnea and treat accordingly.  Plan: -Recommend CBC obtained by primary care provider every 3 months and referral back to hematology for possible therapeutic phlebotomy if hematocrit exceeds 55%.   Voice recognition software was used and creation of this note. Despite my best effort at editing the text, some misspelling/errors may have occurred.  No orders of the defined types were placed in this encounter.   Cancer Staging No matching staging information was found for the patient.  All questions were answered.  . The patient knows to call the clinic with any problems, questions or concerns.  This note was electronically signed.    History of Presenting Illness Kevin Bradley is a 72 y.o. male followed in the [facility] for elevation of hemoglobin, referred by Dr Janith Lima.  Patient's past medical history significant for anxiety, atrial fibrillation, BPH, hypertension, hypothyroidism, and nephrolithiasis.  Of note, patient has been smoking up until about 1 year ago.  Has not relapsed since.  Does not drink alcohol or use recreational substances.  On additional note, patient has recently completed a 7-day course of prednisone for gout exacerbation, I assume.  At the present time, patient denies any fevers, chills, night sweats.  No unexpected weight loss or  decline in the ability to tolerate activities of daily living.  Patient denies facial flushing, headaches, syncopal events.  Denies shortness of breath, chest pain, or cough.  No skin burning or pruritus.  Denies nausea, vomiting, early satiety, abdominal pain, diarrhea, or constipation.  Oncological/hematological History: --Labs, 05/17/09: WBC   6.6, Hgb 16.9, Hct 50.5, Plt 208 --Labs, 07/11/10: WBC   8.2, Hgb 17.6, Hct 50.5, Plt 229; --Labs, 11/10/10: WBC   8.1, Hgb 18.6, Hct 53.3, Plt 203 --Labs, 09/18/11: WBC   6.9, Hgb 17.8, Hct 51.2, Plt 217 --Labs, 10/17/12: WBC   7.9, Hgb 18.7, Hct     ..., Plt 203 --Labs, 10/16/13: WBC   7.8, Hgb 17.4, Hct 51.2, Plt 226 --Labs, 09/18/14: WBC   9.1, Hgb 17.9, Hct 53.5, Plt 213 --Labs, 09/17/15: WBC   8.2, Hgb 16.3, Hct 48.5, Plt 248; --Labs, 10/22/16: WBC   8.8, Hgb 16.7, Hct 47.3, Plt 236; --Labs, 12/13/17: WBC 14.1, Hgb 18.4, Hct 53.3, Plt 269; --Labs, 01/05/18: WBC   6.7, Hgb 18.0, Hct 51.0, Plt 224; Epo 6.1    No history exists.    Medical History: Past Medical History:  Diagnosis Date  . Anxiety   . Atrial fibrillation ([place])   . BPH (benign prostatic hypertrophy)   . External thrombosed hemorrhoids   . History of colonoscopy   . HTN (hypertension)   . Hx of colonic polyps   . Hypothyroidism   . Low HDL (under 40)   . Nephrolithiasis     Surgical History: Past Surgical History:  Procedure Laterality Date  . APPENDECTOMY    . cyst tongue  1990   benign  . INGUINAL HERNIA REPAIR Right 1990  . INGUINAL HERNIA REPAIR  Left 1948  . THYROIDECTOMY, PARTIAL  1990  . TONSILLECTOMY  1948    Family History: Family History  Problem Relation Age of Onset  . Heart attack Father   . AAA (abdominal aortic aneurysm) Father 70  . Alcohol abuse Unknown   . Breast cancer Unknown   . Cancer Neg Hx   . Early death Neg Hx   . Diabetes Neg Hx   . Heart disease Neg Hx   . Hyperlipidemia Neg Hx   . Hypertension Neg Hx   . Kidney disease  Neg Hx   . Stroke Neg Hx   . Colon cancer Neg Hx     Social History: Social History   Socioeconomic History  . Marital status: Married    Spouse name: Not on file  . Number of children: Not on file  . Years of education: Not on file  . Highest education level: Not on file  Occupational History  . Occupation: [employer] operational officer  . Financial resource strain: Not on file  . Food insecurity:    Worry: Not on file    Inability: Not on file  . Transportation needs:    Medical: Not on file    Non-medical: Not on file  Tobacco Use  . Smoking status: Former Smoker    Types: Cigars    Last attempt to quit: 2017    Years since quitting: 2.4  . Smokeless tobacco: Never Used  Substance and Sexual Activity  . Alcohol use: No    Alcohol/week: 0.0 oz  . Drug use: No  . Sexual activity: Yes  Lifestyle  . Physical activity:    Days per week: Not on file    Minutes per session: Not on file  . Stress: Not on file  Relationships  . Social connections:    Talks on phone: Not on file    Gets together: Not on file    Attends religious service: Not on file    Active member of club or organization: Not on file    Attends meetings of clubs or organizations: Not on file    Relationship status: Not on file  . Intimate partner violence:    Fear of current or ex partner: Not on file    Emotionally abused: Not on file    Physically abused: Not on file    Forced sexual activity: Not on file  Other Topics Concern  . Not on file  Social History Narrative  . Not on file    Allergies: Allergies  Allergen Reactions  . Penicillins     REACTION: passed out    Medications:  Current Outpatient Medications  Medication Sig Dispense Refill  . amLODipine (NORVASC) 10 MG tablet TAKE 1 TABLET BY MOUTH DAILY 180 tablet 2  . aspirin EC 325 MG tablet Take 325 mg by mouth daily.      Marland Kitchen BYSTOLIC 2.5 MG tablet TAKE 1 TABLET BY MOUTH EVERY DAY 90 tablet 0  . Fish Oil OIL Take 1 tablet by  mouth daily.      . flecainide (TAMBOCOR) 50 MG tablet TAKE 2 TABLETS BY MOUTH EVERY DAY 180 tablet 3  . levothyroxine (SYNTHROID, LEVOTHROID) 50 MCG tablet TAKE 1 TABLET(50 MCG) BY MOUTH DAILY BEFORE BREAKFAST 90 tablet 1  . losartan-hydrochlorothiazide (HYZAAR) 100-12.5 MG tablet TAKE 1 TABLET BY MOUTH DAILY 90 tablet 1  . potassium chloride SA (K-DUR,KLOR-CON) 20 MEQ tablet TAKE ONE TABLET BY MOUTH TWICE DAILY 180 tablet 1  . pravastatin (  PRAVACHOL) 20 MG tablet Take 1 tablet (20 mg total) by mouth daily. 90 tablet 3   No current facility-administered medications for this visit.     Review of Systems: Review of Systems  All other systems reviewed and are negative.    PHYSICAL EXAMINATION Blood pressure (!) 142/68, pulse 62, temperature 98.2 F (36.8 C), temperature source Oral, resp. rate 17, height 6' (1.829 m), weight 205 lb 3.2 oz (93.1 kg), SpO2 99 %.  ECOG PERFORMANCE STATUS: 0 - Asymptomatic  Physical Exam  Constitutional: He is oriented to person, place, and time. He appears well-developed and well-nourished. No distress.  HENT:  Head: Normocephalic and atraumatic.  Mouth/Throat: Oropharynx is clear and moist. No oropharyngeal exudate.  Eyes: Pupils are equal, round, and reactive to light. Conjunctivae and EOM are normal. No scleral icterus.  Neck: No thyromegaly present.  Cardiovascular: Normal rate, regular rhythm, normal heart sounds and intact distal pulses. Exam reveals no gallop and no friction rub.  No murmur heard. Pulmonary/Chest: Effort normal and breath sounds normal. No stridor. No respiratory distress. He has no wheezes. He has no rales. He exhibits no tenderness.  Abdominal: Soft. Bowel sounds are normal. He exhibits no distension and no mass. There is no tenderness. There is no guarding.  Musculoskeletal: He exhibits no edema.  Lymphadenopathy:    He has no cervical adenopathy.  Neurological: He is alert and oriented to person, place, and time. He displays  normal reflexes. No cranial nerve deficit or sensory deficit.  Skin: Skin is warm and dry. No rash noted. He is not diaphoretic. No erythema. No pallor.     LABORATORY DATA: I have personally reviewed the data as listed: No visits with results within 1 Week(s) from this visit.  Latest known visit with results is:  Office Visit on 01/05/2018  Component Date Value Ref Range Status  . Erythropoietin 01/05/2018 6.1  2.6 - 18.5 mIU/mL Final   Comment: (NOTE) Beckman [employer] UniCel DxI [place] obtained with different assay methods or kits cannot be used interchangeably. Results cannot be interpreted as absolute evidence of the presence or absence of malignant disease. Performed At: [facility] [place], [place] [id number] Rush Farmer MD EU:[id number] Performed at [facility], [place] [address] [place], [place] [address]        Ardath Sax, MD

## 2018-02-03 NOTE — Telephone Encounter (Signed)
lvm for pt stating that it is okay to have cataract surgery and that we will be entering the sleep study referral.

## 2018-02-11 DIAGNOSIS — H2512 Age-related nuclear cataract, left eye: Secondary | ICD-10-CM | POA: Diagnosis not present

## 2018-02-11 DIAGNOSIS — H25011 Cortical age-related cataract, right eye: Secondary | ICD-10-CM | POA: Diagnosis not present

## 2018-02-11 DIAGNOSIS — H2511 Age-related nuclear cataract, right eye: Secondary | ICD-10-CM | POA: Diagnosis not present

## 2018-02-16 ENCOUNTER — Telehealth: Payer: Self-pay | Admitting: Internal Medicine

## 2018-02-16 DIAGNOSIS — J449 Chronic obstructive pulmonary disease, unspecified: Secondary | ICD-10-CM

## 2018-02-16 NOTE — Telephone Encounter (Signed)
°  Pt. Has the sleep study scheduled but has not heard anything about  The Emphysema test.   He has not heard anything on this

## 2018-02-16 NOTE — Telephone Encounter (Signed)
LVM for patient to call back regarding this. This appt is already made.

## 2018-02-16 NOTE — Telephone Encounter (Unsigned)
Copied from [place] [id number]. Topic: Referral - Status >> Feb 16, 2018 12:54 PM Neva Seat wrote: Pt is checking on the Emphysema referral status.  Pt hasn't heard from them yet. Please call pt back to let him know.

## 2018-02-16 NOTE — Telephone Encounter (Signed)
Error

## 2018-02-16 NOTE — Telephone Encounter (Signed)
Can xray for other test be order for this.?

## 2018-02-16 NOTE — Telephone Encounter (Signed)
  Are you okay with referral to pulmonary so they may test for emphysema?

## 2018-02-16 NOTE — Telephone Encounter (Signed)
yes

## 2018-02-17 NOTE — Telephone Encounter (Signed)
Referral has been entered. Will you let the patient know please.

## 2018-02-18 NOTE — Telephone Encounter (Signed)
LVM informing patient.

## 2018-02-25 ENCOUNTER — Encounter: Payer: Self-pay | Admitting: Internal Medicine

## 2018-02-28 DIAGNOSIS — H2512 Age-related nuclear cataract, left eye: Secondary | ICD-10-CM | POA: Diagnosis not present

## 2018-03-07 ENCOUNTER — Other Ambulatory Visit: Payer: Self-pay | Admitting: Internal Medicine

## 2018-03-07 DIAGNOSIS — L814 Other melanin hyperpigmentation: Secondary | ICD-10-CM | POA: Diagnosis not present

## 2018-03-07 DIAGNOSIS — D225 Melanocytic nevi of trunk: Secondary | ICD-10-CM | POA: Diagnosis not present

## 2018-03-07 DIAGNOSIS — Z872 Personal history of diseases of the skin and subcutaneous tissue: Secondary | ICD-10-CM | POA: Diagnosis not present

## 2018-03-07 DIAGNOSIS — L821 Other seborrheic keratosis: Secondary | ICD-10-CM | POA: Diagnosis not present

## 2018-03-11 ENCOUNTER — Ambulatory Visit (HOSPITAL_BASED_OUTPATIENT_CLINIC_OR_DEPARTMENT_OTHER): Payer: BLUE CROSS/BLUE SHIELD | Attending: Internal Medicine | Admitting: Internal Medicine

## 2018-03-11 VITALS — Ht 72.0 in | Wt 195.0 lb

## 2018-03-11 DIAGNOSIS — R0683 Snoring: Secondary | ICD-10-CM | POA: Insufficient documentation

## 2018-03-19 DIAGNOSIS — R0683 Snoring: Secondary | ICD-10-CM | POA: Diagnosis not present

## 2018-03-19 NOTE — Procedures (Signed)
    Patient Name: Kevin Bradley, Kevin Bradley Date: 03/11/2018 Gender: Male D.O.B: 29-[name]-1946 Age (years): 71 Referring Provider: Janith Lima Height (inches): 51 Interpreting Physician: Baird Lyons MD, ABSM Weight (lbs): 195 RPSGT: Baxter Flattery BMI: 28 MRN: [id number] Neck Size: 17.00  CLINICAL INFORMATION Sleep Study Type: NPSG  Indication for sleep study: Fatigue, Snoring, Witnesses Apnea / Gasping During Sleep  Epworth Sleepiness Score: 10  SLEEP STUDY TECHNIQUE As per the AASM Manual for the Scoring of Sleep and Associated Events v2.3 (April 2016) with a hypopnea requiring 4% desaturations.  The channels recorded and monitored were frontal, central and occipital EEG, electrooculogram (EOG), submentalis EMG (chin), nasal and oral airflow, thoracic and abdominal wall motion, anterior tibialis EMG, snore microphone, electrocardiogram, and pulse oximetry.  MEDICATIONS Medications self-administered by patient taken the night of the study : none reported  SLEEP ARCHITECTURE The study was initiated at 10:35:45 PM and ended at 4:31:33 AM.  Sleep onset time was 46.5 minutes and the sleep efficiency was 59.7%%. The total sleep time was 212.5 minutes.  Stage REM latency was 184.5 minutes.  The patient spent 5.9%% of the night in stage N1 sleep, 78.6%% in stage N2 sleep, 0.0%% in stage N3 and 15.53% in REM.  Alpha intrusion was absent.  Supine sleep was 0.00%.  RESPIRATORY PARAMETERS The overall apnea/hypopnea index (AHI) was 0.8 per hour. There were 1 total apneas, including 1 obstructive, 0 central and 0 mixed apneas. There were 2 hypopneas and 4 RERAs.  The AHI during Stage REM sleep was 3.6 per hour.  AHI while supine was N/A per hour.  The mean oxygen saturation was 91.5%. The minimum SpO2 during sleep was 89.0%.  moderate snoring was noted during this study.  CARDIAC DATA The 2 lead EKG demonstrated sinus rhythm. The mean heart rate was 24.5 beats per minute. Other  EKG findings include: None.  LEG MOVEMENT DATA The total PLMS were 0 with a resulting PLMS index of 0.0. Associated arousal with leg movement index was 0.0 .  IMPRESSIONS - No significant obstructive sleep apnea occurred during this study (AHI = 0.8/h). - No significant central sleep apnea occurred during this study (CAI = 0.0/h). - The patient had minimal oxygen desaturation during the study (Min O2 = 89.0%) - The patient snored with moderate snoring volume. - Restless sleep with frequent brief, nonspecific arousals. - No cardiac abnormalities were noted during this study. - Clinically significant periodic limb movements did not occur during sleep. No significant associated arousals.  DIAGNOSIS - Primary snoring  RECOMMENDATIONS - Manage for snoring if appropriate. - Sleep hygiene should be reviewed to assess factors that may improve sleep quality. - Weight management and regular exercise should be initiated or continued if appropriate.  [Electronically signed] 03/19/2018 09:47 AM  Baird Lyons MD, ABSM Diplomate, American Board of Sleep Medicine   NPI: [id number]                         [place] [place] [facility]  ELECTRONICALLY SIGNED ON:  03/19/2018, 9:44 AM [place] PH: [phone]   FX: [phone] [place]

## 2018-03-21 ENCOUNTER — Encounter: Payer: Self-pay | Admitting: Internal Medicine

## 2018-03-26 ENCOUNTER — Other Ambulatory Visit: Payer: Self-pay | Admitting: Cardiology

## 2018-04-06 NOTE — Progress Notes (Signed)
HPI: FU atrial fibrillation. A previous cardioNet monitor revealed sinus rhythm with occasional bursts of atrial fibrillation. He had an episode of chest pain approximately 9 years ago and had a cardiac catheterization that was normal by his report perfomed in [place]. An echocardiogram in May of 2011 revealed normal LV function, trivial aortic insufficiency and mild left atrial enlargement. Patient placed on flecanide previously. A followup stress echocardiogram showed no ischemia and normal LV function. There was a question of septal hypokinesis both at rest and with stress. There was no exercise induced arrhythmias. Cardionet in Aug 2012 showed sinus with PAT but no VT. Previously declined anticoagulants. Abdominal ultrasound in May of 2014 showed no aneurysm. Since I last saw him  he has occasional palpitations.  He denies dyspnea, chest pain or syncope.  He is being evaluated for elevated hemoglobin.  Current Outpatient Medications  Medication Sig Dispense Refill  . amLODipine (NORVASC) 10 MG tablet TAKE 1 TABLET BY MOUTH DAILY 180 tablet 2  . aspirin EC 325 MG tablet Take 325 mg by mouth daily.      . Fish Oil OIL Take 1 tablet by mouth daily.      . flecainide (TAMBOCOR) 50 MG tablet TAKE 2 TABLETS BY MOUTH EVERY DAY 180 tablet 3  . levothyroxine (SYNTHROID, LEVOTHROID) 50 MCG tablet TAKE 1 TABLET(50 MCG) BY MOUTH DAILY BEFORE BREAKFAST 90 tablet 1  . losartan-hydrochlorothiazide (HYZAAR) 100-12.5 MG tablet TAKE 1 TABLET BY MOUTH DAILY 90 tablet 1  . nebivolol (BYSTOLIC) 2.5 MG tablet Take 1 tablet (2.5 mg total) by mouth daily. MUST KEEP APPT FOR FUTURE REFILLS 90 tablet 0  . potassium chloride SA (K-DUR,KLOR-CON) 20 MEQ tablet TAKE ONE TABLET BY MOUTH TWICE DAILY 180 tablet 0  . pravastatin (PRAVACHOL) 20 MG tablet Take 1 tablet (20 mg total) by mouth daily. 90 tablet 3   No current facility-administered medications for this visit.      Past Medical History:  Diagnosis Date  .  Anxiety   . Atrial fibrillation ([place])   . BPH (benign prostatic hypertrophy)   . External thrombosed hemorrhoids   . History of colonoscopy   . HTN (hypertension)   . Hx of colonic polyps   . Hypothyroidism   . Low HDL (under 40)   . Nephrolithiasis     Past Surgical History:  Procedure Laterality Date  . APPENDECTOMY    . cyst tongue  1990   benign  . INGUINAL HERNIA REPAIR Right 1990  . INGUINAL HERNIA REPAIR Left 1948  . THYROIDECTOMY, PARTIAL  1990  . TONSILLECTOMY  1948    Social History   Socioeconomic History  . Marital status: Married    Spouse name: Not on file  . Number of children: Not on file  . Years of education: Not on file  . Highest education level: Not on file  Occupational History  . Occupation: [employer] operational officer  . Financial resource strain: Not on file  . Food insecurity:    Worry: Not on file    Inability: Not on file  . Transportation needs:    Medical: Not on file    Non-medical: Not on file  Tobacco Use  . Smoking status: Former Smoker    Types: Cigars    Last attempt to quit: 2017    Years since quitting: 2.5  . Smokeless tobacco: Never Used  Substance and Sexual Activity  . Alcohol use: No    Alcohol/week: 0.0 oz  .  Drug use: No  . Sexual activity: Yes  Lifestyle  . Physical activity:    Days per week: Not on file    Minutes per session: Not on file  . Stress: Not on file  Relationships  . Social connections:    Talks on phone: Not on file    Gets together: Not on file    Attends religious service: Not on file    Active member of club or organization: Not on file    Attends meetings of clubs or organizations: Not on file    Relationship status: Not on file  . Intimate partner violence:    Fear of current or ex partner: Not on file    Emotionally abused: Not on file    Physically abused: Not on file    Forced sexual activity: Not on file  Other Topics Concern  . Not on file  Social History Narrative  . Not on  file    Family History  Problem Relation Age of Onset  . Heart attack Father   . AAA (abdominal aortic aneurysm) Father 45  . Alcohol abuse Unknown   . Breast cancer Unknown   . Cancer Neg Hx   . Early death Neg Hx   . Diabetes Neg Hx   . Heart disease Neg Hx   . Hyperlipidemia Neg Hx   . Hypertension Neg Hx   . Kidney disease Neg Hx   . Stroke Neg Hx   . Colon cancer Neg Hx     ROS: no fevers or chills, productive cough, hemoptysis, dysphasia, odynophagia, melena, hematochezia, dysuria, hematuria, rash, seizure activity, orthopnea, PND, pedal edema, claudication. Remaining systems are negative.  Physical Exam: Well-developed well-nourished in no acute distress.  Skin is warm and dry.  HEENT is normal.  Neck is supple.  Chest is clear to auscultation with normal expansion.  Cardiovascular exam is regular rate and rhythm.  Abdominal exam nontender or distended. No masses palpated. Extremities show no edema. neuro grossly intact  ECG- personally reviewed  A/P  1 paroxysmal atrial fibrillation-patient is in sinus rhythm today.  We will continue with flecainide for rhythm control and Bystolic for rate control if atrial fibrillation recurs.  Continue aspirin.  CHADSvasc 2.  I have recommended apixaban 5 mg twice daily.  He would like to consider.  We have provided a prescription today and he will contact us if he is agreeable.  If he does initiate apixaban I would discontinue aspirin at that time.  Would then check hemoglobin and renal function 4 weeks later.  He understands the higher risk of CVA on aspirin compared to apixaban.  2 hypertension-blood pressure is controlled.  Continue present medications.  3 hyperlipidemia-continue statin.  4 history of bradycardia-patient is not symptomatic and heart rate today is in the 50s.  No change in medications.  Kirk Ruths, MD

## 2018-04-12 ENCOUNTER — Ambulatory Visit: Payer: BLUE CROSS/BLUE SHIELD | Admitting: Cardiology

## 2018-04-12 ENCOUNTER — Encounter: Payer: Self-pay | Admitting: Cardiology

## 2018-04-12 VITALS — BP 127/76 | HR 58 | Ht 72.0 in | Wt 195.8 lb

## 2018-04-12 DIAGNOSIS — I480 Paroxysmal atrial fibrillation: Secondary | ICD-10-CM

## 2018-04-12 DIAGNOSIS — I10 Essential (primary) hypertension: Secondary | ICD-10-CM

## 2018-04-12 DIAGNOSIS — E7800 Pure hypercholesterolemia, unspecified: Secondary | ICD-10-CM

## 2018-04-12 MED ORDER — ASPIRIN EC 81 MG PO TBEC: 81.0000 mg | DELAYED_RELEASE_TABLET | Freq: Every day | ORAL | Status: DC

## 2018-04-12 MED ORDER — APIXABAN 5 MG PO TABS: 5.0000 mg | ORAL_TABLET | Freq: Two times a day (BID) | ORAL | 6 refills | Status: DC

## 2018-04-12 NOTE — Patient Instructions (Signed)
Medication Instructions:   DECREASE ASPIRIN TO 81 MG ONCE DAILY  ELIQUIS 5 MG ONE TABLET TWICE DAILY  Follow-Up:  Your physician wants you to follow-up in: [place] will receive a reminder letter in the mail two months in advance. If you don't receive a letter, please call our office to schedule the follow-up appointment.   If you need a refill on your cardiac medications before your next appointment, please call your pharmacy.

## 2018-04-15 ENCOUNTER — Other Ambulatory Visit (INDEPENDENT_AMBULATORY_CARE_PROVIDER_SITE_OTHER): Payer: BLUE CROSS/BLUE SHIELD

## 2018-04-15 ENCOUNTER — Ambulatory Visit: Payer: BLUE CROSS/BLUE SHIELD | Admitting: Pulmonary Disease

## 2018-04-15 ENCOUNTER — Encounter: Payer: Self-pay | Admitting: Pulmonary Disease

## 2018-04-15 VITALS — BP 130/76 | HR 80 | Ht 72.0 in | Wt 196.5 lb

## 2018-04-15 DIAGNOSIS — J449 Chronic obstructive pulmonary disease, unspecified: Secondary | ICD-10-CM

## 2018-04-15 LAB — CBC WITH DIFFERENTIAL/PLATELET
BASOS ABS: 0 10*3/uL (ref 0.0–0.1)
Basophils Relative: 0.6 % (ref 0.0–3.0)
EOS ABS: 0.2 10*3/uL (ref 0.0–0.7)
Eosinophils Relative: 2 % (ref 0.0–5.0)
HEMATOCRIT: 48.8 % (ref 39.0–52.0)
HEMOGLOBIN: 17.2 g/dL — AB (ref 13.0–17.0)
Lymphocytes Relative: 26.8 % (ref 12.0–46.0)
Lymphs Abs: 2.2 10*3/uL (ref 0.7–4.0)
MCHC: 35.3 g/dL (ref 30.0–36.0)
MCV: 90.8 fl (ref 78.0–100.0)
MONOS PCT: 11 % (ref 3.0–12.0)
Monocytes Absolute: 0.9 10*3/uL (ref 0.1–1.0)
NEUTROS ABS: 5 10*3/uL (ref 1.4–7.7)
Neutrophils Relative %: 59.6 % (ref 43.0–77.0)
PLATELETS: 247 10*3/uL (ref 150.0–400.0)
RBC: 5.37 Mil/uL (ref 4.22–5.81)
RDW: 12.6 % (ref 11.5–15.5)
WBC: 8.3 10*3/uL (ref 4.0–10.5)

## 2018-04-15 NOTE — Patient Instructions (Signed)
Schedule CBC with differential today and pulmonary function test Follow-up in 1 to 2 months.

## 2018-04-15 NOTE — Progress Notes (Addendum)
Kevin Bradley    [id number]    09/06/1946  Primary Care Physician:Jones, Arvid Right, MD  Referring Physician: Janith Lima, MD [address], [place] [address]  Chief complaint: Consult for COPD  HPI: 72 year old with history of hypertension, atrial fibrillation, elevated hemoglobin He has been evaluated by hematology for elevated RBCs.  There is no evidence of myeloproliferative disorder.  He is also been worked up for hypoxia with a sleep study with no evidence of sleep apnea, desaturation.  He has been referred here for evaluation of COPD.  States that he is doing well with his breathing.  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  Quit cigarettes in early 2000.  Continues to smoke cigars Travel history: Lived in [place], [place], [place] Relevant family history: No significant family history of lung issues.  Outpatient Encounter Medications as of 04/15/2018  Medication Sig  . amLODipine (NORVASC) 10 MG tablet TAKE 1 TABLET BY MOUTH DAILY  . apixaban (ELIQUIS) 5 MG TABS tablet Take 1 tablet (5 mg total) by mouth 2 (two) times daily.  Marland Kitchen aspirin EC 81 MG tablet Take 1 tablet (81 mg total) by mouth daily.  . Fish Oil OIL Take 1 tablet by mouth daily.    . flecainide (TAMBOCOR) 50 MG tablet TAKE 2 TABLETS BY MOUTH EVERY DAY  . levothyroxine (SYNTHROID, LEVOTHROID) 50 MCG tablet TAKE 1 TABLET(50 MCG) BY MOUTH DAILY BEFORE BREAKFAST  . losartan-hydrochlorothiazide (HYZAAR) 100-12.5 MG tablet TAKE 1 TABLET BY MOUTH DAILY  . nebivolol (BYSTOLIC) 2.5 MG tablet Take 1 tablet (2.5 mg total) by mouth daily. MUST KEEP APPT FOR FUTURE REFILLS  . potassium chloride SA (K-DUR,KLOR-CON) 20 MEQ tablet TAKE ONE  TABLET BY MOUTH TWICE DAILY  . pravastatin (PRAVACHOL) 20 MG tablet Take 1 tablet (20 mg total) by mouth daily.   No facility-administered encounter medications on file as of 04/15/2018.     Allergies as of 04/15/2018 - Review Complete 04/15/2018  Allergen Reaction Noted  . Penicillins      Past Medical History:  Diagnosis Date  . Anxiety   . Atrial fibrillation ([place])   . BPH (benign prostatic hypertrophy)   . External thrombosed hemorrhoids   . History of colonoscopy   . HTN (hypertension)   . Hx of colonic polyps   . Hypothyroidism   . Low HDL (under 40)   . Nephrolithiasis     Past Surgical History:  Procedure Laterality Date  . APPENDECTOMY    . cyst tongue  1990   benign  . INGUINAL HERNIA REPAIR Right 1990  . INGUINAL HERNIA REPAIR Left 1948  . THYROIDECTOMY, PARTIAL  1990  . TONSILLECTOMY  1948    Family History  Problem Relation Age of Onset  . Heart attack Father   . AAA (abdominal aortic aneurysm) Father 8  . Alcohol abuse Unknown   . Breast cancer Unknown   . Cancer Neg Hx   . Early death Neg Hx   . Diabetes Neg Hx   . Heart disease Neg Hx   . Hyperlipidemia Neg Hx   . Hypertension Neg Hx   . Kidney disease Neg Hx   . Stroke Neg Hx   . Colon cancer  Neg Hx     Social History   Socioeconomic History  . Marital status: Married    Spouse name: Not on file  . Number of children: Not on file  . Years of education: Not on file  . Highest education level: Not on file  Occupational History  . Occupation: [employer] operational officer  . Financial resource strain: Not on file  . Food insecurity:    Worry: Not on file    Inability: Not on file  . Transportation needs:    Medical: Not on file    Non-medical: Not on file  Tobacco Use  . Smoking status: Former Smoker    Packs/day: 2.00    Years: 10.00    Pack years: 20.00    Types: Cigars, Cigarettes    Last attempt to quit: 2005    Years since quitting: 14.6  . Smokeless tobacco: Never Used  .  Tobacco comment: quit smoking cigars in 2017--04/15/18  Substance and Sexual Activity  . Alcohol use: No    Alcohol/week: 0.0 standard drinks  . Drug use: No  . Sexual activity: Yes  Lifestyle  . Physical activity:    Days per week: Not on file    Minutes per session: Not on file  . Stress: Not on file  Relationships  . Social connections:    Talks on phone: Not on file    Gets together: Not on file    Attends religious service: Not on file    Active member of club or organization: Not on file    Attends meetings of clubs or organizations: Not on file    Relationship status: Not on file  . Intimate partner violence:    Fear of current or ex partner: Not on file    Emotionally abused: Not on file    Physically abused: Not on file    Forced sexual activity: Not on file  Other Topics Concern  . Not on file  Social History Narrative  . Not on file    Review of systems: Review of Systems  Constitutional: Negative for fever and chills.  HENT: Negative.   Eyes: Negative for blurred vision.  Respiratory: as per HPI  Cardiovascular: Negative for chest pain and palpitations.  Gastrointestinal: Negative for vomiting, diarrhea, blood per rectum. Genitourinary: Negative for dysuria, urgency, frequency and hematuria.  Musculoskeletal: Negative for myalgias, back pain and joint pain.  Skin: Negative for itching and rash.  Neurological: Negative for dizziness, tremors, focal weakness, seizures and loss of consciousness.  Endo/Heme/Allergies: Negative for environmental allergies.  Psychiatric/Behavioral: Negative for depression, suicidal ideas and hallucinations.  All other systems reviewed and are negative.  Physical Exam: Blood pressure 130/76, pulse 80, height 6' (1.829 m), weight 196 lb 8 oz (89.1 kg), SpO2 96 %. Gen:      No acute distress HEENT:  EOMI, sclera anicteric Neck:     No masses; no thyromegaly Lungs:    Clear to auscultation bilaterally; normal respiratory effort CV:          Regular rate and rhythm; no murmurs Abd:      + bowel sounds; soft, non-tender; no palpable masses, no distension Ext:    No edema; adequate peripheral perfusion Skin:      Warm and dry; no rash Neuro: alert and oriented x 3 Psych: normal mood and affect  Data Reviewed: Sleep study 03/11/2018 -no significant obstructive sleep apnea, AHI 0.8.  Minimal oxygen desaturation.  Lowest recorded O2 sat was 89%.  Assessment:  Evaluation for COPD He is being worked up for polycythemia of unclear etiology, no evidence of myelo proliferaitve disorder or obstructive sleep apnea He did not desat on exertion today and sats remained in the high 90s. We will schedule him for pulmonary function test for further evaluation  Polycythemia Recommendation is to get CBC every 3 months and phlebotomy if hematocrit goes greater than 55 Repeat CBC today  Plan/Recommendations: - PFTs, CBC  Marshell Garfinkel MD [facility] 04/15/2018, 2:56 PM  CC: Janith Lima, MD

## 2018-04-26 ENCOUNTER — Other Ambulatory Visit: Payer: Self-pay | Admitting: Cardiology

## 2018-05-02 ENCOUNTER — Telehealth: Payer: Self-pay | Admitting: Pulmonary Disease

## 2018-05-02 NOTE — Telephone Encounter (Signed)
Advised pt of results. Pt understood and nothing further is needed.    Notes recorded by Marshell Garfinkel, MD on 04/28/2018 at 2:38 PM EDT Please let patient know his hemoglobin is better compared to previous tests

## 2018-05-18 ENCOUNTER — Encounter: Payer: Self-pay | Admitting: Pulmonary Disease

## 2018-05-18 ENCOUNTER — Encounter: Payer: BLUE CROSS/BLUE SHIELD | Admitting: Pulmonary Disease

## 2018-05-18 ENCOUNTER — Ambulatory Visit: Payer: BLUE CROSS/BLUE SHIELD | Admitting: Pulmonary Disease

## 2018-05-18 VITALS — BP 132/78 | HR 55 | Ht 70.0 in | Wt 197.8 lb

## 2018-05-18 DIAGNOSIS — D751 Secondary polycythemia: Secondary | ICD-10-CM | POA: Diagnosis not present

## 2018-05-18 DIAGNOSIS — J449 Chronic obstructive pulmonary disease, unspecified: Secondary | ICD-10-CM

## 2018-05-18 LAB — PULMONARY FUNCTION TEST
DL/VA % PRED: 85 %
DL/VA: 3.9 ml/min/mmHg/L
DLCO COR: 26.83 ml/min/mmHg
DLCO cor % pred: 82 %
DLCO unc % pred: 88 %
DLCO unc: 28.61 ml/min/mmHg
FEF 25-75 PRE: 3.27 L/s
FEF2575-%PRED-PRE: 140 %
FEV1-%Pred-Pre: 106 %
FEV1-PRE: 3.33 L
FEV1FVC-%Pred-Pre: 110 %
FEV6-%PRED-PRE: 101 %
FEV6-PRE: 4.12 L
FEV6FVC-%Pred-Pre: 106 %
FVC-%Pred-Pre: 95 %
FVC-Pre: 4.12 L
PRE FEV6/FVC RATIO: 100 %
Pre FEV1/FVC ratio: 81 %
RV % pred: 104 %
RV: 2.63 L
TLC % PRED: 103 %
TLC: 7.27 L

## 2018-05-18 NOTE — Patient Instructions (Signed)
Reviewed your lung function tests which show any abnormality  Follow-up as needed.

## 2018-05-18 NOTE — Progress Notes (Signed)
Kevin Bradley    [id number]    05/04/1946  Primary Care Physician:Jones, Arvid Right, MD  Referring Physician: Janith Lima, MD [address], [place] [address]  Chief complaint: Consult for COPD  HPI: 72 year old with history of hypertension, atrial fibrillation, elevated hemoglobin He has been evaluated by hematology for elevated RBCs.  There is no evidence of myeloproliferative disorder.  He is also been worked up for hypoxia with a sleep study with no evidence of sleep apnea, desaturation.  He has been referred here for evaluation of COPD.  States that he is doing well with his breathing.  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  Quit cigarettes in early 2000.  Continues to smoke cigars Travel history: Lived in [place], [place], [place] Relevant family history: No significant family history of lung issues.  Interim history: Patient is here for review of PFTs.  States that breathing is doing well with no new issues Denies any dyspnea cough, sputum production, wheezing.  Outpatient Encounter Medications as of 05/18/2018  Medication Sig  . amLODipine (NORVASC) 10 MG tablet TAKE 1 TABLET BY MOUTH DAILY  . aspirin EC 81 MG tablet Take 1 tablet (81 mg total) by mouth daily.  . Fish Oil OIL Take 1 tablet by mouth daily.    . flecainide (TAMBOCOR) 50 MG tablet TAKE 2 TABLETS BY MOUTH EVERY DAY  . levothyroxine (SYNTHROID, LEVOTHROID) 50 MCG tablet TAKE 1 TABLET(50 MCG) BY MOUTH DAILY BEFORE BREAKFAST  . losartan-hydrochlorothiazide (HYZAAR) 100-12.5 MG tablet TAKE 1 TABLET BY MOUTH DAILY  . nebivolol (BYSTOLIC) 2.5 MG tablet Take 1 tablet (2.5 mg total) by mouth daily. MUST KEEP APPT FOR FUTURE  REFILLS  . potassium chloride SA (K-DUR,KLOR-CON) 20 MEQ tablet TAKE ONE TABLET BY MOUTH TWICE DAILY  . pravastatin (PRAVACHOL) 20 MG tablet Take 1 tablet (20 mg total) by mouth daily.  . [DISCONTINUED] apixaban (ELIQUIS) 5 MG TABS tablet Take 1 tablet (5 mg total) by mouth 2 (two) times daily. (Patient not taking: Reported on 05/18/2018)   No facility-administered encounter medications on file as of 05/18/2018.     Allergies as of 05/18/2018 - Review Complete 05/18/2018  Allergen Reaction Noted  . Penicillins      Past Medical History:  Diagnosis Date  . Anxiety   . Atrial fibrillation ([place])   . BPH (benign prostatic hypertrophy)   . External thrombosed hemorrhoids   . History of colonoscopy   . HTN (hypertension)   . Hx of colonic polyps   . Hypothyroidism   . Low HDL (under 40)   . Nephrolithiasis     Past Surgical History:  Procedure Laterality Date  . APPENDECTOMY    . cyst tongue  1990   benign  . INGUINAL HERNIA REPAIR Right 1990  . INGUINAL HERNIA REPAIR Left 1948  . THYROIDECTOMY, PARTIAL  1990  . TONSILLECTOMY  1948    Family History  Problem Relation Age of Onset  . Heart attack Father   . AAA (abdominal aortic aneurysm) Father 26  . Alcohol abuse Unknown   . Breast cancer Unknown   . Cancer Neg Hx   . Early death Neg Hx   . Diabetes Neg Hx   .  Heart disease Neg Hx   . Hyperlipidemia Neg Hx   . Hypertension Neg Hx   . Kidney disease Neg Hx   . Stroke Neg Hx   . Colon cancer Neg Hx     Social History   Socioeconomic History  . Marital status: Married    Spouse name: Not on file  . Number of children: Not on file  . Years of education: Not on file  . Highest education level: Not on file  Occupational History  . Occupation: [employer] operational officer  . Financial resource strain: Not on file  . Food insecurity:    Worry: Not on file    Inability: Not on file  . Transportation needs:    Medical: Not on file    Non-medical: Not on file    Tobacco Use  . Smoking status: Former Smoker    Packs/day: 2.00    Years: 10.00    Pack years: 20.00    Types: Cigars, Cigarettes    Last attempt to quit: 2005    Years since quitting: 14.7  . Smokeless tobacco: Never Used  . Tobacco comment: quit smoking cigars in 2017--04/15/18  Substance and Sexual Activity  . Alcohol use: No    Alcohol/week: 0.0 standard drinks  . Drug use: No  . Sexual activity: Yes  Lifestyle  . Physical activity:    Days per week: Not on file    Minutes per session: Not on file  . Stress: Not on file  Relationships  . Social connections:    Talks on phone: Not on file    Gets together: Not on file    Attends religious service: Not on file    Active member of club or organization: Not on file    Attends meetings of clubs or organizations: Not on file    Relationship status: Not on file  . Intimate partner violence:    Fear of current or ex partner: Not on file    Emotionally abused: Not on file    Physically abused: Not on file    Forced sexual activity: Not on file  Other Topics Concern  . Not on file  Social History Narrative  . Not on file    Review of systems: Review of Systems  Constitutional: Negative for fever and chills.  HENT: Negative.   Eyes: Negative for blurred vision.  Respiratory: as per HPI  Cardiovascular: Negative for chest pain and palpitations.  Gastrointestinal: Negative for vomiting, diarrhea, blood per rectum. Genitourinary: Negative for dysuria, urgency, frequency and hematuria.  Musculoskeletal: Negative for myalgias, back pain and joint pain.  Skin: Negative for itching and rash.  Neurological: Negative for dizziness, tremors, focal weakness, seizures and loss of consciousness.  Endo/Heme/Allergies: Negative for environmental allergies.  Psychiatric/Behavioral: Negative for depression, suicidal ideas and hallucinations.  All other systems reviewed and are negative.  Physical Exam: Blood pressure 130/76, pulse 80,  height 6' (1.829 m), weight 196 lb 8 oz (89.1 kg), SpO2 96 %. Gen:      No acute distress HEENT:  EOMI, sclera anicteric Neck:     No masses; no thyromegaly Lungs:    Clear to auscultation bilaterally; normal respiratory effort CV:         Regular rate and rhythm; no murmurs Abd:      + bowel sounds; soft, non-tender; no palpable masses, no distension Ext:    No edema; adequate peripheral perfusion Skin:      Warm and dry; no rash Neuro:  alert and oriented x 3 Psych: normal mood and affect  Data Reviewed: Sleep study 03/11/2018 -no significant obstructive sleep apnea, AHI 0.8.  Minimal oxygen desaturation.  Lowest recorded O2 sat was 89%.  PFTs 05/18/2018 FVC 4.12 [95%], FEV1 3.33 [106%], F/F 81, TLC 103%, DLCO 88% Normal test  Assessment:  Evaluation for COPD He is being worked up for polycythemia of unclear etiology, no evidence of myelo proliferaitve disorder or obstructive sleep apnea. He did not desat on exertion and sats remained in the high 90s.   PFTs reviewed with no abnormality.  There is no evidence of COPD, no restriction or diffusion impairment It does not appear that the polycythemia is due to any lung abnormality or hypoxia.  Polycythemia Follow-up with primary care.  Recommendation is to get CBC every 3 months.  Plan/Recommendations: -Follow-up as needed.  Marshell Garfinkel MD [facility] 05/18/2018, 12:25 PM  CC: Janith Lima, MD

## 2018-05-26 DIAGNOSIS — Z23 Encounter for immunization: Secondary | ICD-10-CM | POA: Diagnosis not present

## 2018-06-04 ENCOUNTER — Other Ambulatory Visit: Payer: Self-pay | Admitting: Internal Medicine

## 2018-06-04 DIAGNOSIS — I10 Essential (primary) hypertension: Secondary | ICD-10-CM

## 2018-07-02 ENCOUNTER — Other Ambulatory Visit: Payer: Self-pay | Admitting: Internal Medicine

## 2018-07-02 DIAGNOSIS — E039 Hypothyroidism, unspecified: Secondary | ICD-10-CM

## 2018-07-26 ENCOUNTER — Other Ambulatory Visit: Payer: Self-pay | Admitting: Cardiology

## 2018-08-01 ENCOUNTER — Encounter: Payer: Self-pay | Admitting: Internal Medicine

## 2018-08-01 ENCOUNTER — Ambulatory Visit: Payer: BLUE CROSS/BLUE SHIELD | Admitting: Internal Medicine

## 2018-08-01 ENCOUNTER — Other Ambulatory Visit (INDEPENDENT_AMBULATORY_CARE_PROVIDER_SITE_OTHER): Payer: BLUE CROSS/BLUE SHIELD

## 2018-08-01 VITALS — BP 130/68 | HR 60 | Temp 97.7°F | Resp 16 | Ht 70.0 in | Wt 202.8 lb

## 2018-08-01 DIAGNOSIS — D751 Secondary polycythemia: Secondary | ICD-10-CM | POA: Diagnosis not present

## 2018-08-01 DIAGNOSIS — E039 Hypothyroidism, unspecified: Secondary | ICD-10-CM | POA: Diagnosis not present

## 2018-08-01 DIAGNOSIS — Z23 Encounter for immunization: Secondary | ICD-10-CM | POA: Diagnosis not present

## 2018-08-01 DIAGNOSIS — R7303 Prediabetes: Secondary | ICD-10-CM

## 2018-08-01 DIAGNOSIS — I10 Essential (primary) hypertension: Secondary | ICD-10-CM

## 2018-08-01 LAB — CBC WITH DIFFERENTIAL/PLATELET
Basophils Absolute: 0.1 10*3/uL (ref 0.0–0.1)
Basophils Relative: 0.7 % (ref 0.0–3.0)
EOS PCT: 2 % (ref 0.0–5.0)
Eosinophils Absolute: 0.2 10*3/uL (ref 0.0–0.7)
HCT: 50.8 % (ref 39.0–52.0)
Hemoglobin: 17.7 g/dL — ABNORMAL HIGH (ref 13.0–17.0)
LYMPHS ABS: 2.1 10*3/uL (ref 0.7–4.0)
Lymphocytes Relative: 25.5 % (ref 12.0–46.0)
MCHC: 34.8 g/dL (ref 30.0–36.0)
MCV: 89.6 fl (ref 78.0–100.0)
MONOS PCT: 11 % (ref 3.0–12.0)
Monocytes Absolute: 0.9 10*3/uL (ref 0.1–1.0)
NEUTROS ABS: 4.9 10*3/uL (ref 1.4–7.7)
NEUTROS PCT: 60.8 % (ref 43.0–77.0)
PLATELETS: 242 10*3/uL (ref 150.0–400.0)
RBC: 5.67 Mil/uL (ref 4.22–5.81)
RDW: 12.6 % (ref 11.5–15.5)
WBC: 8.1 10*3/uL (ref 4.0–10.5)

## 2018-08-01 LAB — BASIC METABOLIC PANEL
BUN: 20 mg/dL (ref 6–23)
CALCIUM: 9.6 mg/dL (ref 8.4–10.5)
CO2: 30 meq/L (ref 19–32)
CREATININE: 0.91 mg/dL (ref 0.40–1.50)
Chloride: 102 mEq/L (ref 96–112)
GFR: 86.76 mL/min (ref >60.00)
Glucose, Bld: 89 mg/dL (ref 70–99)
Potassium: 4 mEq/L (ref 3.5–5.1)
Sodium: 139 mEq/L (ref 135–145)

## 2018-08-01 LAB — TSH: TSH: 4.93 u[IU]/mL — ABNORMAL HIGH (ref 0.35–4.50)

## 2018-08-01 LAB — HEMOGLOBIN A1C: Hgb A1c MFr Bld: 6.1 % (ref 4.6–6.5)

## 2018-08-01 MED ORDER — LEVOTHYROXINE SODIUM 75 MCG PO TABS: 75.0000 ug | ORAL_TABLET | Freq: Every day | ORAL | 0 refills | Status: DC

## 2018-08-01 NOTE — Patient Instructions (Signed)
Hypothyroidism Hypothyroidism is a disorder of the thyroid. The thyroid is a large gland that is located in the lower front of the neck. The thyroid releases hormones that control how the body works. With hypothyroidism, the thyroid does not make enough of these hormones. What are the causes? Causes of hypothyroidism may include:  Viral infections.  Pregnancy.  Your own defense system (immune system) attacking your thyroid.  Certain medicines.  Birth defects.  Past radiation treatments to your head or neck.  Past treatment with radioactive iodine.  Past surgical removal of part or all of your thyroid.  Problems with the gland that is located in the center of your brain (pituitary).  What are the signs or symptoms? Signs and symptoms of hypothyroidism may include:  Feeling as though you have no energy (lethargy).  Inability to tolerate cold.  Weight gain that is not explained by a change in diet or exercise habits.  Dry skin.  Coarse hair.  Menstrual irregularity.  Slowing of thought processes.  Constipation.  Sadness or depression.  How is this diagnosed? Your health care provider may diagnose hypothyroidism with blood tests and ultrasound tests. How is this treated? Hypothyroidism is treated with medicine that replaces the hormones that your body does not make. After you begin treatment, it may take several weeks for symptoms to go away. Follow these instructions at home:  Take medicines only as directed by your health care provider.  If you start taking any new medicines, tell your health care provider.  Keep all follow-up visits as directed by your health care provider. This is important. As your condition improves, your dosage needs may change. You will need to have blood tests regularly so that your health care provider can watch your condition. Contact a health care provider if:  Your symptoms do not get better with treatment.  You are taking thyroid  replacement medicine and: ? You sweat excessively. ? You have tremors. ? You feel anxious. ? You lose weight rapidly. ? You cannot tolerate heat. ? You have emotional swings. ? You have diarrhea. ? You feel weak. Get help right away if:  You develop chest pain.  You develop an irregular heartbeat.  You develop a rapid heartbeat. This information is not intended to replace advice given to you by your health care provider. Make sure you discuss any questions you have with your health care provider. Document Released: 08/24/2005 Document Revised: 01/30/2016 Document Reviewed: 01/09/2014 Elsevier Interactive Patient Education  2018 Elsevier Inc[employer]  

## 2018-08-01 NOTE — Progress Notes (Signed)
Subjective:  Patient ID: Kevin Bradley, male    DOB: 1945-09-23  Age: 72 y.o. MRN: [id number]  CC: Hypertension and Hypothyroidism   HPI Kevin Bradley presents for f/up - He complains of constipation, weight gain, and fatigue.  He is taking the stool softener twice a day to relieve the constipation.  Outpatient Medications Prior to Visit  Medication Sig Dispense Refill  . amLODipine (NORVASC) 10 MG tablet TAKE 1 TABLET BY MOUTH DAILY 180 tablet 2  . aspirin EC 81 MG tablet Take 1 tablet (81 mg total) by mouth daily.    . Fish Oil OIL Take 1 tablet by mouth daily.      . flecainide (TAMBOCOR) 50 MG tablet TAKE 2 TABLETS BY MOUTH EVERY DAY 180 tablet 0  . losartan-hydrochlorothiazide (HYZAAR) 100-12.5 MG tablet TAKE 1 TABLET BY MOUTH DAILY 90 tablet 0  . nebivolol (BYSTOLIC) 2.5 MG tablet Take 1 tablet (2.5 mg total) by mouth daily. MUST KEEP APPT FOR FUTURE REFILLS 90 tablet 0  . potassium chloride SA (K-DUR,KLOR-CON) 20 MEQ tablet TAKE ONE TABLET BY MOUTH TWICE DAILY 180 tablet 0  . pravastatin (PRAVACHOL) 20 MG tablet Take 1 tablet (20 mg total) by mouth daily. 90 tablet 3  . levothyroxine (SYNTHROID, LEVOTHROID) 50 MCG tablet TAKE 1 TABLET(50 MCG) BY MOUTH DAILY BEFORE BREAKFAST 90 tablet 1  . levothyroxine (SYNTHROID, LEVOTHROID) 50 MCG tablet TAKE 1 TABLET(50 MCG) BY MOUTH DAILY 90 tablet 0   No facility-administered medications prior to visit.     ROS Review of Systems  Constitutional: Positive for fatigue and unexpected weight change. Negative for appetite change and diaphoresis.  HENT: Negative.   Eyes: Negative for visual disturbance.  Respiratory: Negative for cough, chest tightness, shortness of breath and wheezing.   Cardiovascular: Negative for chest pain, palpitations and leg swelling.  Gastrointestinal: Positive for constipation. Negative for abdominal pain, diarrhea, nausea and vomiting.  Endocrine: Negative for cold intolerance and heat intolerance.    Genitourinary: Negative.  Negative for difficulty urinating.  Musculoskeletal: Negative.  Negative for arthralgias and myalgias.  Skin: Negative.   Neurological: Negative.  Negative for dizziness, weakness, light-headedness and headaches.  Hematological: Negative for adenopathy. Does not bruise/bleed easily.  Psychiatric/Behavioral: Negative.     Objective:  BP 130/68 (BP Location: Left Arm, Patient Position: Sitting, Cuff Size: Large)   Pulse 60   Temp 97.7 F (36.5 C) (Oral)   Resp 16   Ht 5\' 10"  (1.778 m)   Wt 202 lb 12 oz (92 kg)   SpO2 97%   BMI 29.09 kg/m   BP Readings from Last 3 Encounters:  08/01/18 130/68  05/18/18 132/78  04/15/18 130/76    Wt Readings from Last 3 Encounters:  08/01/18 202 lb 12 oz (92 kg)  05/18/18 197 lb 12.8 oz (89.7 kg)  04/15/18 196 lb 8 oz (89.1 kg)    Physical Exam  Constitutional: He is oriented to person, place, and time.  HENT:  Mouth/Throat: Oropharynx is clear and moist. No oropharyngeal exudate.  Eyes: Conjunctivae are normal. No scleral icterus.  Neck: Normal range of motion. Neck supple. No JVD present. No thyromegaly present.  Cardiovascular: Normal rate, regular rhythm and normal heart sounds.  No murmur heard. Pulmonary/Chest: Effort normal and breath sounds normal. No respiratory distress. He has no wheezes. He has no rales.  Abdominal: Soft. Bowel sounds are normal. He exhibits no mass. There is no hepatosplenomegaly. There is no tenderness.  Musculoskeletal: Normal range of motion. He exhibits  no edema, tenderness or deformity.  Lymphadenopathy:    He has no cervical adenopathy.  Neurological: He is alert and oriented to person, place, and time.  Skin: Skin is warm and dry. No rash noted.  Vitals reviewed.   Lab Results  Component Value Date   WBC 8.1 08/01/2018   HGB 17.7 (H) 08/01/2018   HCT 50.8 08/01/2018   PLT 242.0 08/01/2018   GLUCOSE 89 08/01/2018   CHOL 139 12/13/2017   TRIG 136.0 12/13/2017   HDL  53.20 12/13/2017   LDLDIRECT 95.0 10/22/2016   LDLCALC 59 12/13/2017   ALT 16 01/05/2018   AST 17 01/05/2018   NA 139 08/01/2018   K 4.0 08/01/2018   CL 102 08/01/2018   CREATININE 0.91 08/01/2018   BUN 20 08/01/2018   CO2 30 08/01/2018   TSH 4.93 (H) 08/01/2018   PSA 2.53 12/13/2017   HGBA1C 6.1 08/01/2018    No results found.  Assessment & Plan:   Kevin Bradley was seen today for hypertension and hypothyroidism.  Diagnoses and all orders for this visit:  Essential hypertension- His blood pressure is adequately well controlled.  Electrolytes and renal function are normal. -     Basic metabolic panel; Future  Prediabetes- His A1c is at 6.1%.  He is prediabetic.  Medical therapy is not indicated. -     Basic metabolic panel; Future -     Hemoglobin A1c; Future  Erythrocytosis- His hemoglobin is slightly elevated.  His hematocrit is normal.  He has been screened by hematology for malignant causes.  I think the most likely explanation is his mild COPD.  Fortunately, he has quit smoking. -     CBC with Differential/Platelet; Future  Need for pneumococcal vaccination -     Pneumococcal polysaccharide vaccine 23-valent greater than or equal to 2yo subcutaneous/IM  Acquired hypothyroidism- His TSH is slightly elevated and he is symptomatic.  I have therefore recommended that he take a higher dose of levothyroxine. -     TSH; Future -     levothyroxine (SYNTHROID, LEVOTHROID) 75 MCG tablet; Take 1 tablet (75 mcg total) by mouth daily.   I have discontinued Gwyndolyn Saxon L. Faley's levothyroxine and levothyroxine. I am also having him start on levothyroxine. Additionally, I am having him maintain his Fish Oil, pravastatin, amLODipine, nebivolol, aspirin EC, potassium chloride SA, losartan-hydrochlorothiazide, and flecainide.  Meds ordered this encounter  Medications  . levothyroxine (SYNTHROID, LEVOTHROID) 75 MCG tablet    Sig: Take 1 tablet (75 mcg total) by mouth daily.    Dispense:  90  tablet    Refill:  0     Follow-up: Return in about 4 months (around 11/30/2018).  Scarlette Calico, MD

## 2018-09-10 ENCOUNTER — Other Ambulatory Visit: Payer: Self-pay | Admitting: Internal Medicine

## 2018-09-10 DIAGNOSIS — I10 Essential (primary) hypertension: Secondary | ICD-10-CM

## 2018-09-13 ENCOUNTER — Other Ambulatory Visit: Payer: Self-pay | Admitting: Cardiology

## 2018-09-19 ENCOUNTER — Ambulatory Visit: Payer: Self-pay | Admitting: *Deleted

## 2018-09-19 NOTE — Telephone Encounter (Signed)
Pt reports 2 episodes of "Legs weak for a few seconds in the morning." States first episode 1- 1/2 months ago, again this AM. States both episodes first in am when getting out of bed. Duration "Few seconds."  States sat back down and resolved. Denies any other symptoms; no dizziness, no pain or swelling of legs, no further weakness occurs during day.  Denies any other weakness of extremities. States does not take sleeping aide at HS, no new medications except dosage change of synthroid "Months ago." Also reports index finger of right hand is "Hot to touch sometimes, then goes away."  This occurred only twice as well. No redness, swelling. Pt has appt with Dr. [name] 10/24/2018. Offered pt earlier appt, declined. States "If Dr. [name] thinks I need to be seen earlier I'll come in."  Instructed to call back if any other symptoms present; if weakness of legs worsen in duration, frequency, intensity. Assured pt [place] would route to practice.  Reason for Disposition . Weakness is a chronic symptom (recurrent or ongoing AND present > 4 weeks)  Answer Assessment - Initial Assessment Questions 1. DESCRIPTION: "Describe how you are feeling."     Weakness of legs. 2. SEVERITY: "How bad is it?"  "Can you stand and walk?"   - MILD - Feels weak or tired, but does not interfere with work, school or normal activities   - Grant to stand and walk; weakness interferes with work, school, or normal activities   - SEVERE - Unable to stand or walk     LAsts few seconds 3. ONSET:  "When did the weakness begin?"     1 1/2 months ago, 2 episodes only 4. CAUSE: "What do you think is causing the weakness?"     unsure 5. MEDICINES: "Have you recently started a new medicine or had a change in the amount of a medicine?"     Synthroid dose changed months ago per pts report 6. OTHER SYMPTOMS: "Do you have any other symptoms?" (e.g., chest pain, fever, cough, SOB, vomiting, diarrhea, bleeding, other areas of pain)     none    Weakness of both legs only  Protocols used: WEAKNESS (GENERALIZED) AND FATIGUE-A-AH

## 2018-09-27 ENCOUNTER — Other Ambulatory Visit: Payer: Self-pay | Admitting: Internal Medicine

## 2018-09-27 ENCOUNTER — Other Ambulatory Visit: Payer: Self-pay | Admitting: Cardiology

## 2018-09-27 DIAGNOSIS — E039 Hypothyroidism, unspecified: Secondary | ICD-10-CM

## 2018-09-28 ENCOUNTER — Other Ambulatory Visit: Payer: Self-pay | Admitting: Cardiology

## 2018-09-28 DIAGNOSIS — I480 Paroxysmal atrial fibrillation: Secondary | ICD-10-CM

## 2018-10-02 ENCOUNTER — Other Ambulatory Visit: Payer: Self-pay | Admitting: Internal Medicine

## 2018-10-02 DIAGNOSIS — E039 Hypothyroidism, unspecified: Secondary | ICD-10-CM

## 2018-10-24 ENCOUNTER — Encounter: Payer: Self-pay | Admitting: Internal Medicine

## 2018-10-24 ENCOUNTER — Ambulatory Visit: Payer: BLUE CROSS/BLUE SHIELD | Admitting: Internal Medicine

## 2018-10-24 ENCOUNTER — Other Ambulatory Visit (INDEPENDENT_AMBULATORY_CARE_PROVIDER_SITE_OTHER): Payer: BLUE CROSS/BLUE SHIELD

## 2018-10-24 VITALS — BP 124/78 | HR 52 | Temp 98.1°F | Resp 16 | Ht 70.0 in | Wt 199.0 lb

## 2018-10-24 DIAGNOSIS — E785 Hyperlipidemia, unspecified: Secondary | ICD-10-CM

## 2018-10-24 DIAGNOSIS — D751 Secondary polycythemia: Secondary | ICD-10-CM

## 2018-10-24 DIAGNOSIS — E039 Hypothyroidism, unspecified: Secondary | ICD-10-CM

## 2018-10-24 DIAGNOSIS — I10 Essential (primary) hypertension: Secondary | ICD-10-CM

## 2018-10-24 LAB — BASIC METABOLIC PANEL
BUN: 24 mg/dL — ABNORMAL HIGH (ref 6–23)
CO2: 28 mEq/L (ref 19–32)
Calcium: 9.2 mg/dL (ref 8.4–10.5)
Chloride: 102 mEq/L (ref 96–112)
Creatinine, Ser: 0.98 mg/dL (ref 0.40–1.50)
GFR: 74.89 mL/min (ref >60.00)
Glucose, Bld: 100 mg/dL — ABNORMAL HIGH (ref 70–99)
Potassium: 4.2 mEq/L (ref 3.5–5.1)
Sodium: 139 mEq/L (ref 135–145)

## 2018-10-24 LAB — CBC WITH DIFFERENTIAL/PLATELET
Basophils Absolute: 0 10*3/uL (ref 0.0–0.1)
Basophils Relative: 0.6 % (ref 0.0–3.0)
Eosinophils Absolute: 0.2 10*3/uL (ref 0.0–0.7)
Eosinophils Relative: 2.4 % (ref 0.0–5.0)
HCT: 49.6 % (ref 39.0–52.0)
Hemoglobin: 17 g/dL (ref 13.0–17.0)
Lymphocytes Relative: 23.5 % (ref 12.0–46.0)
Lymphs Abs: 1.8 10*3/uL (ref 0.7–4.0)
MCHC: 34.3 g/dL (ref 30.0–36.0)
MCV: 90.2 fl (ref 78.0–100.0)
Monocytes Absolute: 0.7 10*3/uL (ref 0.1–1.0)
Monocytes Relative: 8.8 % (ref 3.0–12.0)
Neutro Abs: 5 10*3/uL (ref 1.4–7.7)
Neutrophils Relative %: 64.7 % (ref 43.0–77.0)
Platelets: 262 10*3/uL (ref 150.0–400.0)
RBC: 5.5 Mil/uL (ref 4.22–5.81)
RDW: 12.7 % (ref 11.5–15.5)
WBC: 7.7 10*3/uL (ref 4.0–10.5)

## 2018-10-24 LAB — LIPID PANEL
CHOLESTEROL: 148 mg/dL (ref 0–200)
HDL: 34.5 mg/dL — ABNORMAL LOW (ref >39.00)
LDL Cholesterol: 73 mg/dL (ref 0–99)
NonHDL: 113.25
Total CHOL/HDL Ratio: 4
Triglycerides: 199 mg/dL — ABNORMAL HIGH (ref 0.0–149.0)
VLDL: 39.8 mg/dL (ref 0.0–40.0)

## 2018-10-24 LAB — TSH: TSH: 2.58 u[IU]/mL (ref 0.35–4.50)

## 2018-10-24 NOTE — Progress Notes (Signed)
Subjective:  Patient ID: DEVARIO BUCKLEW, male    DOB: 1946/01/15  Age: 73 y.o. MRN: [id number]  CC: Hypertension and Hypothyroidism   HPI WILIAM CAUTHORN presents for f/up - He feels well today and offers no complaints.  He tells me his blood pressure has been well controlled.  Outpatient Medications Prior to Visit  Medication Sig Dispense Refill  . amLODipine (NORVASC) 10 MG tablet TAKE 1 TABLET BY MOUTH DAILY 180 tablet 3  . aspirin EC 81 MG tablet Take 1 tablet (81 mg total) by mouth daily.    Marland Kitchen BYSTOLIC 2.5 MG tablet TAKE 1 TABLET(2.5 MG) BY MOUTH DAILY 90 tablet 2  . Fish Oil OIL Take 1 tablet by mouth daily.      . flecainide (TAMBOCOR) 50 MG tablet TAKE 2 TABLETS BY MOUTH EVERY DAY 180 tablet 2  . levothyroxine (SYNTHROID, LEVOTHROID) 50 MCG tablet TAKE 1 TABLET(50 MCG) BY MOUTH DAILY 90 tablet 0  . levothyroxine (SYNTHROID, LEVOTHROID) 75 MCG tablet TAKE 1 TABLET(75 MCG) BY MOUTH DAILY 90 tablet 0  . losartan-hydrochlorothiazide (HYZAAR) 100-12.5 MG tablet TAKE 1 TABLET BY MOUTH DAILY 90 tablet 1  . potassium chloride SA (K-DUR,KLOR-CON) 20 MEQ tablet TAKE ONE TABLET BY MOUTH TWICE DAILY 180 tablet 0  . pravastatin (PRAVACHOL) 20 MG tablet Take 1 tablet (20 mg total) by mouth daily. 90 tablet 3   No facility-administered medications prior to visit.     ROS Review of Systems  Constitutional: Negative.  Negative for diaphoresis, fatigue and unexpected weight change.  HENT: Negative.   Eyes: Negative for visual disturbance.  Respiratory: Negative for cough, chest tightness, shortness of breath and wheezing.   Cardiovascular: Negative for chest pain, palpitations and leg swelling.  Gastrointestinal: Negative for abdominal pain, blood in stool, constipation, diarrhea, nausea and vomiting.  Endocrine: Negative.  Negative for cold intolerance and heat intolerance.  Genitourinary: Negative.  Negative for difficulty urinating and dysuria.  Musculoskeletal: Negative.  Negative for  arthralgias, back pain and myalgias.  Skin: Negative.  Negative for color change and pallor.  Neurological: Negative.  Negative for dizziness, weakness, light-headedness and headaches.  Hematological: Negative for adenopathy. Does not bruise/bleed easily.  Psychiatric/Behavioral: Negative.     Objective:  BP 124/78 (BP Location: Left Arm, Patient Position: Sitting, Cuff Size: Normal)   Pulse (!) 52   Temp 98.1 F (36.7 C) (Oral)   Resp 16   Ht 5\' 10"  (1.778 m)   Wt 199 lb 0.6 oz (90.3 kg)   SpO2 97%   BMI 28.56 kg/m   BP Readings from Last 3 Encounters:  10/24/18 124/78  08/01/18 130/68  05/18/18 132/78    Wt Readings from Last 3 Encounters:  10/24/18 199 lb 0.6 oz (90.3 kg)  08/01/18 202 lb 12 oz (92 kg)  05/18/18 197 lb 12.8 oz (89.7 kg)    Physical Exam Vitals signs reviewed.  Constitutional:      Appearance: He is not ill-appearing or diaphoretic.  HENT:     Nose: Nose normal. No congestion or rhinorrhea.     Mouth/Throat:     Mouth: Mucous membranes are moist.     Pharynx: Oropharynx is clear. No oropharyngeal exudate or posterior oropharyngeal erythema.  Eyes:     General: No scleral icterus.    Conjunctiva/sclera: Conjunctivae normal.  Neck:     Musculoskeletal: Normal range of motion and neck supple. No neck rigidity or muscular tenderness.  Cardiovascular:     Rate and Rhythm: Normal rate and  regular rhythm.     Heart sounds: No murmur. No gallop.   Pulmonary:     Effort: Pulmonary effort is normal. No respiratory distress.     Breath sounds: No stridor. No wheezing, rhonchi or rales.  Abdominal:     General: Bowel sounds are normal.     Palpations: There is no hepatomegaly, splenomegaly or mass.     Tenderness: There is no abdominal tenderness. There is no guarding.  Musculoskeletal: Normal range of motion.        General: No swelling.     Right lower leg: No edema.     Left lower leg: No edema.  Lymphadenopathy:     Cervical: No cervical  adenopathy.  Skin:    General: Skin is warm and dry.     Coloration: Skin is not pale.  Neurological:     General: No focal deficit present.     Mental Status: He is oriented to person, place, and time. Mental status is at baseline.     Lab Results  Component Value Date   WBC 7.7 10/24/2018   HGB 17.0 10/24/2018   HCT 49.6 10/24/2018   PLT 262.0 10/24/2018   GLUCOSE 100 (H) 10/24/2018   CHOL 148 10/24/2018   TRIG 199.0 (H) 10/24/2018   HDL 34.50 (L) 10/24/2018   LDLDIRECT 95.0 10/22/2016   LDLCALC 73 10/24/2018   ALT 16 01/05/2018   AST 17 01/05/2018   NA 139 10/24/2018   K 4.2 10/24/2018   CL 102 10/24/2018   CREATININE 0.98 10/24/2018   BUN 24 (H) 10/24/2018   CO2 28 10/24/2018   TSH 2.58 10/24/2018   PSA 2.53 12/13/2017   HGBA1C 6.1 08/01/2018    No results found.  Assessment & Plan:   Fontaine was seen today for hypertension and hypothyroidism.  Diagnoses and all orders for this visit:  Acquired hypothyroidism- His TSH is in the normal range.  He will remain on the current dose of levothyroxine. -     TSH; Future  Erythrocytosis- His H&H are normal now that he has quit smoking. -     CBC with Differential/Platelet; Future  Hyperlipidemia with target LDL less than 100- He has achieved his LDL goal and is doing well on the statin.  His triglycerides are mildly elevated so I have asked him to decrease his intake of fat and carbohydrates. -     Lipid panel; Future  Essential hypertension- His blood pressure is well controlled.  Electrolytes and renal function are normal. -     Basic metabolic panel; Future   I am having Gurkirat L. Capaldi maintain his Fish Oil, pravastatin, aspirin EC, potassium chloride SA, losartan-hydrochlorothiazide, flecainide, BYSTOLIC, levothyroxine, amLODipine, and levothyroxine.  No orders of the defined types were placed in this encounter.    Follow-up: Return in about 6 months (around 04/24/2019).  Scarlette Calico, MD

## 2018-10-24 NOTE — Patient Instructions (Signed)
Hypothyroidism  Hypothyroidism is when the thyroid gland does not make enough of certain hormones (it is underactive). The thyroid gland is a small gland located in the lower front part of the neck, just in front of the windpipe (trachea). This gland makes hormones that help control how the body uses food for energy (metabolism) as well as how the heart and brain function. These hormones also play a role in keeping your bones strong. When the thyroid is underactive, it produces too little of the hormones thyroxine (T4) and triiodothyronine (T3). What are the causes? This condition may be caused by:  Hashimoto's disease. This is a disease in which the body's disease-fighting system (immune system) attacks the thyroid gland. This is the most common cause.  Viral infections.  Pregnancy.  Certain medicines.  Birth defects.  Past radiation treatments to the head or neck for cancer.  Past treatment with radioactive iodine.  Past exposure to radiation in the environment.  Past surgical removal of part or all of the thyroid.  Problems with a gland in the center of the brain (pituitary gland).  Lack of enough iodine in the diet. What increases the risk? You are more likely to develop this condition if:  You are male.  You have a family history of thyroid conditions.  You use a medicine called lithium.  You take medicines that affect the immune system (immunosuppressants). What are the signs or symptoms? Symptoms of this condition include:  Feeling as though you have no energy (lethargy).  Not being able to tolerate cold.  Weight gain that is not explained by a change in diet or exercise habits.  Lack of appetite.  Dry skin.  Coarse hair.  Menstrual irregularity.  Slowing of thought processes.  Constipation.  Sadness or depression. How is this diagnosed? This condition may be diagnosed based on:  Your symptoms, your medical history, and a physical exam.  Blood  tests. You may also have imaging tests, such as an ultrasound or MRI. How is this treated? This condition is treated with medicine that replaces the thyroid hormones that your body does not make. After you begin treatment, it may take several weeks for symptoms to go away. Follow these instructions at home:  Take over-the-counter and prescription medicines only as told by your health care provider.  If you start taking any new medicines, tell your health care provider.  Keep all follow-up visits as told by your health care provider. This is important. ? As your condition improves, your dosage of thyroid hormone medicine may change. ? You will need to have blood tests regularly so that your health care provider can monitor your condition. Contact a health care provider if:  Your symptoms do not get better with treatment.  You are taking thyroid replacement medicine and you: ? Sweat a lot. ? Have tremors. ? Feel anxious. ? Lose weight rapidly. ? Cannot tolerate heat. ? Have emotional swings. ? Have diarrhea. ? Feel weak. Get help right away if you have:  Chest pain.  An irregular heartbeat.  A rapid heartbeat.  Difficulty breathing. Summary  Hypothyroidism is when the thyroid gland does not make enough of certain hormones (it is underactive).  When the thyroid is underactive, it produces too little of the hormones thyroxine (T4) and triiodothyronine (T3).  The most common cause is Hashimoto's disease, a disease in which the body's disease-fighting system (immune system) attacks the thyroid gland. The condition can also be caused by viral infections, medicine, pregnancy, or past   radiation treatment to the head or neck.  Symptoms may include weight gain, dry skin, constipation, feeling as though you do not have energy, and not being able to tolerate cold.  This condition is treated with medicine to replace the thyroid hormones that your body does not make. This information  is not intended to replace advice given to you by your health care provider. Make sure you discuss any questions you have with your health care provider. Document Released: 08/24/2005 Document Revised: 08/04/2017 Document Reviewed: 08/04/2017 Elsevier Interactive Patient Education  2019 [employer]  

## 2018-10-25 ENCOUNTER — Encounter: Payer: Self-pay | Admitting: Internal Medicine

## 2019-01-24 ENCOUNTER — Encounter: Payer: Self-pay | Admitting: Internal Medicine

## 2019-01-24 ENCOUNTER — Other Ambulatory Visit: Payer: Self-pay | Admitting: *Deleted

## 2019-01-24 DIAGNOSIS — E039 Hypothyroidism, unspecified: Secondary | ICD-10-CM

## 2019-01-24 MED ORDER — LEVOTHYROXINE SODIUM 75 MCG PO TABS: ORAL_TABLET | ORAL | 0 refills | Status: DC

## 2019-02-11 ENCOUNTER — Other Ambulatory Visit: Payer: Self-pay | Admitting: Internal Medicine

## 2019-02-22 ENCOUNTER — Encounter: Payer: Self-pay | Admitting: Internal Medicine

## 2019-03-02 ENCOUNTER — Other Ambulatory Visit: Payer: Self-pay | Admitting: Internal Medicine

## 2019-03-02 DIAGNOSIS — L308 Other specified dermatitis: Secondary | ICD-10-CM | POA: Diagnosis not present

## 2019-03-02 DIAGNOSIS — L814 Other melanin hyperpigmentation: Secondary | ICD-10-CM | POA: Diagnosis not present

## 2019-03-02 DIAGNOSIS — L570 Actinic keratosis: Secondary | ICD-10-CM | POA: Diagnosis not present

## 2019-03-02 DIAGNOSIS — L820 Inflamed seborrheic keratosis: Secondary | ICD-10-CM | POA: Diagnosis not present

## 2019-03-02 DIAGNOSIS — D225 Melanocytic nevi of trunk: Secondary | ICD-10-CM | POA: Diagnosis not present

## 2019-03-02 DIAGNOSIS — I10 Essential (primary) hypertension: Secondary | ICD-10-CM

## 2019-03-02 DIAGNOSIS — L821 Other seborrheic keratosis: Secondary | ICD-10-CM | POA: Diagnosis not present

## 2019-03-02 MED ORDER — LOSARTAN POTASSIUM-HCTZ 100-12.5 MG PO TABS: 1.0000 | ORAL_TABLET | Freq: Every day | ORAL | 0 refills | Status: DC

## 2019-03-07 ENCOUNTER — Ambulatory Visit (INDEPENDENT_AMBULATORY_CARE_PROVIDER_SITE_OTHER): Payer: BC Managed Care – PPO | Admitting: Internal Medicine

## 2019-03-07 ENCOUNTER — Other Ambulatory Visit: Payer: Self-pay

## 2019-03-07 ENCOUNTER — Other Ambulatory Visit (INDEPENDENT_AMBULATORY_CARE_PROVIDER_SITE_OTHER): Payer: BC Managed Care – PPO

## 2019-03-07 ENCOUNTER — Encounter: Payer: Self-pay | Admitting: Internal Medicine

## 2019-03-07 VITALS — BP 126/70 | HR 54 | Temp 97.9°F | Ht 70.0 in | Wt 191.8 lb

## 2019-03-07 DIAGNOSIS — E781 Pure hyperglyceridemia: Secondary | ICD-10-CM

## 2019-03-07 DIAGNOSIS — N400 Enlarged prostate without lower urinary tract symptoms: Secondary | ICD-10-CM

## 2019-03-07 DIAGNOSIS — Z0000 Encounter for general adult medical examination without abnormal findings: Secondary | ICD-10-CM

## 2019-03-07 DIAGNOSIS — E039 Hypothyroidism, unspecified: Secondary | ICD-10-CM

## 2019-03-07 DIAGNOSIS — E279 Disorder of adrenal gland, unspecified: Secondary | ICD-10-CM

## 2019-03-07 DIAGNOSIS — R7303 Prediabetes: Secondary | ICD-10-CM | POA: Diagnosis not present

## 2019-03-07 DIAGNOSIS — I10 Essential (primary) hypertension: Secondary | ICD-10-CM

## 2019-03-07 DIAGNOSIS — E278 Other specified disorders of adrenal gland: Secondary | ICD-10-CM

## 2019-03-07 DIAGNOSIS — E559 Vitamin D deficiency, unspecified: Secondary | ICD-10-CM

## 2019-03-07 DIAGNOSIS — I480 Paroxysmal atrial fibrillation: Secondary | ICD-10-CM

## 2019-03-07 LAB — CBC WITH DIFFERENTIAL/PLATELET
Basophils Absolute: 0.1 10*3/uL (ref 0.0–0.1)
Basophils Relative: 0.7 % (ref 0.0–3.0)
Eosinophils Absolute: 0.2 10*3/uL (ref 0.0–0.7)
Eosinophils Relative: 3.1 % (ref 0.0–5.0)
HCT: 49.9 % (ref 39.0–52.0)
Hemoglobin: 17 g/dL (ref 13.0–17.0)
Lymphocytes Relative: 23.9 % (ref 12.0–46.0)
Lymphs Abs: 1.7 10*3/uL (ref 0.7–4.0)
MCHC: 34 g/dL (ref 30.0–36.0)
MCV: 90.7 fl (ref 78.0–100.0)
Monocytes Absolute: 0.7 10*3/uL (ref 0.1–1.0)
Monocytes Relative: 10.4 % (ref 3.0–12.0)
Neutro Abs: 4.4 10*3/uL (ref 1.4–7.7)
Neutrophils Relative %: 61.9 % (ref 43.0–77.0)
Platelets: 238 10*3/uL (ref 150.0–400.0)
RBC: 5.5 Mil/uL (ref 4.22–5.81)
RDW: 12.6 % (ref 11.5–15.5)
WBC: 7.1 10*3/uL (ref 4.0–10.5)

## 2019-03-07 LAB — BASIC METABOLIC PANEL
BUN: 18 mg/dL (ref 6–23)
CO2: 30 mEq/L (ref 19–32)
Calcium: 9.5 mg/dL (ref 8.4–10.5)
Chloride: 102 mEq/L (ref 96–112)
Creatinine, Ser: 1 mg/dL (ref 0.40–1.50)
GFR: 73.09 mL/min (ref >60.00)
Glucose, Bld: 105 mg/dL — ABNORMAL HIGH (ref 70–99)
Potassium: 4.3 mEq/L (ref 3.5–5.1)
Sodium: 140 mEq/L (ref 135–145)

## 2019-03-07 LAB — VITAMIN D 25 HYDROXY (VIT D DEFICIENCY, FRACTURES): VITD: 48.85 ng/mL (ref 30.00–100.00)

## 2019-03-07 LAB — CORTISOL: Cortisol, Plasma: 9.5 ug/dL

## 2019-03-07 LAB — PSA: PSA: 2.68 ng/mL (ref 0.10–4.00)

## 2019-03-07 LAB — HEMOGLOBIN A1C: Hgb A1c MFr Bld: 6.1 % (ref 4.6–6.5)

## 2019-03-07 LAB — TRIGLYCERIDES: Triglycerides: 216 mg/dL — ABNORMAL HIGH (ref 0.0–149.0)

## 2019-03-07 LAB — TSH: TSH: 3.11 u[IU]/mL (ref 0.35–4.50)

## 2019-03-07 MED ORDER — RIVAROXABAN 20 MG PO TABS: 20.0000 mg | ORAL_TABLET | Freq: Every day | ORAL | 1 refills | Status: DC

## 2019-03-07 MED ORDER — LEVOTHYROXINE SODIUM 75 MCG PO TABS: ORAL_TABLET | ORAL | 1 refills | Status: DC

## 2019-03-07 NOTE — Patient Instructions (Signed)

## 2019-03-07 NOTE — Progress Notes (Signed)
Subjective:  Patient ID: Kevin Bradley, male    DOB: 11-23-1945  Age: 73 y.o. MRN: [id number]  CC: Annual Exam, Atrial Fibrillation, Hypertension, Hypothyroidism, and Hyperlipidemia   HPI [name] presents for a CPX.  He continues to have rare, intermittent, brief episodes of palpitations that he attributes to atrial fibrillation.  He had previously not been willing to take a DOAC because he was concerned about the risk of bleeding from trauma.  Now that he is retired he is willing to upgrade from a baby aspirin every day to a DOAC.  He denies any recent episodes of CP, DOE, edema, or fatigue.   Outpatient Medications Prior to Visit  Medication Sig Dispense Refill  . amLODipine (NORVASC) 10 MG tablet TAKE 1 TABLET BY MOUTH DAILY 180 tablet 3  . BYSTOLIC 2.5 MG tablet TAKE 1 TABLET(2.5 MG) BY MOUTH DAILY 90 tablet 2  . Fish Oil OIL Take 1 tablet by mouth daily.      . flecainide (TAMBOCOR) 50 MG tablet TAKE 2 TABLETS BY MOUTH EVERY DAY 180 tablet 2  . losartan-hydrochlorothiazide (HYZAAR) 100-12.5 MG tablet Take 1 tablet by mouth daily. 90 tablet 0  . potassium chloride SA (K-DUR) 20 MEQ tablet TAKE ONE TABLET BY MOUTH TWICE DAILY 180 tablet 0  . pravastatin (PRAVACHOL) 20 MG tablet Take 1 tablet (20 mg total) by mouth daily. 90 tablet 3  . aspirin EC 81 MG tablet Take 1 tablet (81 mg total) by mouth daily.    Marland Kitchen levothyroxine (SYNTHROID) 75 MCG tablet TAKE 1 TABLET(75 MCG) BY MOUTH DAILY 90 tablet 0  . levothyroxine (SYNTHROID, LEVOTHROID) 50 MCG tablet TAKE 1 TABLET(50 MCG) BY MOUTH DAILY 90 tablet 0   No facility-administered medications prior to visit.     ROS Review of Systems  Constitutional: Negative.  Negative for diaphoresis, fatigue and unexpected weight change.  HENT: Negative.   Eyes: Negative for visual disturbance.  Respiratory: Negative.  Negative for cough, chest tightness, shortness of breath and wheezing.   Cardiovascular: Positive for palpitations.  Negative for chest pain and leg swelling.  Gastrointestinal: Negative for abdominal pain, constipation, diarrhea, nausea and vomiting.  Endocrine: Negative.   Genitourinary: Negative.  Negative for difficulty urinating, hematuria, scrotal swelling, testicular pain and urgency.  Musculoskeletal: Negative for arthralgias and myalgias.  Skin: Negative.  Negative for color change.  Neurological: Negative.  Negative for dizziness, weakness, light-headedness, numbness and headaches.  Hematological: Negative for adenopathy. Does not bruise/bleed easily.  Psychiatric/Behavioral: Negative.     Objective:  BP 126/70 (BP Location: Left Arm, Patient Position: Sitting, Cuff Size: Normal)   Pulse (!) 54   Temp 97.9 F (36.6 C) (Oral)   Ht 5\' 10"  (1.778 m)   Wt 191 lb 12 oz (87 kg)   SpO2 98%   BMI 27.51 kg/m   BP Readings from Last 3 Encounters:  03/07/19 126/70  10/24/18 124/78  08/01/18 130/68    Wt Readings from Last 3 Encounters:  03/07/19 191 lb 12 oz (87 kg)  10/24/18 199 lb 0.6 oz (90.3 kg)  08/01/18 202 lb 12 oz (92 kg)    Physical Exam Vitals signs reviewed. Exam conducted with a chaperone present.  Constitutional:      Appearance: He is not ill-appearing or diaphoretic.  HENT:     Nose: Nose normal.     Mouth/Throat:     Mouth: Mucous membranes are moist.  Eyes:     General: No scleral icterus.    Conjunctiva/sclera:  Conjunctivae normal.  Neck:     Musculoskeletal: Normal range of motion. No neck rigidity.  Cardiovascular:     Rate and Rhythm: Regular rhythm. Bradycardia present.     Heart sounds: No murmur. No gallop.      Comments: EKG ----  Marked sinus  Bradycardia  -First degree A-V block  PRi = 270 Low voltage with rightward P-axis and rotation -possible pulmonary disease.   ABNORMAL  Pulmonary:     Effort: Pulmonary effort is normal.     Breath sounds: No stridor. No wheezing, rhonchi or rales.  Abdominal:     General: Abdomen is flat. Bowel sounds are  normal. There is no distension.     Palpations: There is no hepatomegaly or splenomegaly.     Tenderness: There is no abdominal tenderness.     Hernia: No hernia is present. There is no hernia in the left inguinal area or right inguinal area.  Genitourinary:    Pubic Area: No rash.      Penis: Normal and circumcised. No discharge, swelling or lesions.      Scrotum/Testes: Normal.        Right: Mass, tenderness or swelling not present.        Left: Mass, tenderness or swelling not present.     Epididymis:     Right: Normal. Not inflamed or enlarged. No mass or tenderness.     Left: Not inflamed or enlarged. No mass or tenderness.     Prostate: Enlarged (1+ smooth symm BPH). Not tender and no nodules present.     Rectum: Normal. Guaiac result negative. No mass, tenderness, anal fissure, external hemorrhoid or internal hemorrhoid. Normal anal tone.  Musculoskeletal: Normal range of motion.     Right lower leg: No edema.     Left lower leg: No edema.  Lymphadenopathy:     Lower Body: No right inguinal adenopathy. No left inguinal adenopathy.  Skin:    General: Skin is warm and dry.  Neurological:     General: No focal deficit present.     Mental Status: He is alert and oriented to person, place, and time.  Psychiatric:        Mood and Affect: Mood normal.        Behavior: Behavior normal.     Lab Results  Component Value Date   WBC 7.1 03/07/2019   HGB 17.0 03/07/2019   HCT 49.9 03/07/2019   PLT 238.0 03/07/2019   GLUCOSE 105 (H) 03/07/2019   CHOL 148 10/24/2018   TRIG 216.0 (H) 03/07/2019   HDL 34.50 (L) 10/24/2018   LDLDIRECT 95.0 10/22/2016   LDLCALC 73 10/24/2018   ALT 16 01/05/2018   AST 17 01/05/2018   NA 140 03/07/2019   K 4.3 03/07/2019   CL 102 03/07/2019   CREATININE 1.00 03/07/2019   BUN 18 03/07/2019   CO2 30 03/07/2019   TSH 3.11 03/07/2019   PSA 2.68 03/07/2019   HGBA1C 6.1 03/07/2019    No results found.  Assessment & Plan:   Leslie was seen  today for annual exam, atrial fibrillation, hypertension, hypothyroidism and hyperlipidemia.  Diagnoses and all orders for this visit:  Essential hypertension- His blood pressure is adequately well controlled. -     EKG 12-Lead -     CBC with Differential/Platelet; Future -     Basic metabolic panel; Future  Acquired hypothyroidism- His TSH is in the normal range.  He will remain on the current dose of levothyroxine. -  TSH; Future -     levothyroxine (SYNTHROID) 75 MCG tablet; TAKE 1 TABLET(75 MCG) BY MOUTH DAILY  BPH without obstruction/lower urinary tract symptoms- His PSA is normal which is reassuring that he does not have prostate cancer.  He has no symptoms that need to be treated.  Prediabetes- His A1c is at 6.1%.  Medical therapy is not indicated.  He will continue to work on his lifestyle modifications. -     Hemoglobin A1c; Future -     PSA; Future  Routine general medical examination at a health care facility- Exam completed, labs reviewed, vaccines reviewed, screening for colon cancer is up-to-date, patient education material was given.  Vitamin D deficiency -     VITAMIN D 25 Hydroxy (Vit-D Deficiency, Fractures); Future  Adrenal nodule ([place])- He has normal adrenal function. -     Cortisol; Future  Paroxysmal atrial fibrillation ([place])- It sounds like he has minimal A. fib burden.  I have asked him to upgrade to Xarelto for stroke risk reduction. -     rivaroxaban (XARELTO) 20 MG TABS tablet; Take 1 tablet (20 mg total) by mouth daily with supper.  Hypertriglyceridemia- His triglycerides remain mildly elevated.  He agrees to improve his lifestyle modifications. -     Triglycerides; Future   I have discontinued Gwyndolyn Saxon L. Kalka's aspirin EC. I am also having him start on rivaroxaban. Additionally, I am having him maintain his Fish Oil, pravastatin, flecainide, Bystolic, amLODipine, potassium chloride SA, losartan-hydrochlorothiazide, and levothyroxine.  Meds ordered  this encounter  Medications  . rivaroxaban (XARELTO) 20 MG TABS tablet    Sig: Take 1 tablet (20 mg total) by mouth daily with supper.    Dispense:  90 tablet    Refill:  1  . levothyroxine (SYNTHROID) 75 MCG tablet    Sig: TAKE 1 TABLET(75 MCG) BY MOUTH DAILY    Dispense:  90 tablet    Refill:  1     Follow-up: Return in about 6 months (around 09/06/2019).  Scarlette Calico, MD

## 2019-03-20 MED ORDER — NEBIVOLOL HCL 2.5 MG PO TABS: ORAL_TABLET | ORAL | 0 refills | Status: DC

## 2019-04-19 ENCOUNTER — Encounter: Payer: Self-pay | Admitting: *Deleted

## 2019-04-23 ENCOUNTER — Other Ambulatory Visit: Payer: Self-pay | Admitting: Internal Medicine

## 2019-04-23 DIAGNOSIS — E039 Hypothyroidism, unspecified: Secondary | ICD-10-CM

## 2019-04-23 MED ORDER — LEVOTHYROXINE SODIUM 75 MCG PO TABS: ORAL_TABLET | ORAL | 1 refills | Status: DC

## 2019-05-10 NOTE — Progress Notes (Signed)
Virtual Visit via Video Note   This visit type was conducted due to national recommendations for restrictions regarding the COVID-19 Pandemic (e.g. social distancing) in an effort to limit this patient's exposure and mitigate transmission in our community.  Due to his co-morbid illnesses, this patient is at least at moderate risk for complications without adequate follow up.  This format is felt to be most appropriate for this patient at this time.  All issues noted in this document were discussed and addressed.  A limited physical exam was performed with this format.  Please refer to the patient's chart for his consent to telehealth for [facility].   Date:  05/11/2019   ID:  Kevin Bradley, DOB [birth year], MRN [id number]  Patient Location:Home Provider Location: Home  PCP:  Janith Lima, MD  Cardiologist:  Dr Stanford Breed  Evaluation Performed:  Follow-Up Visit  Chief Complaint:  FU atrial fibrillation  History of Present Illness:    FU atrial fibrillation. A previous cardioNet monitor revealed sinus rhythm with occasional bursts of atrial fibrillation. He had an episode of chest pain approximately 9 years ago and had a cardiac catheterization that was normal by his report perfomed in [place]. An echocardiogram in May of 2011 revealed normal LV function, trivial aortic insufficiency and mild left atrial enlargement. Patient placed on flecanide previously. A followup stress echocardiogram showed no ischemia and normal LV function. There was a question of septal hypokinesis both at rest and with stress. There was no exercise induced arrhythmias. Cardionet in Aug 2012 showed sinus with PAT but no VT. Previously declined anticoagulants. Abdominal ultrasound in May of 2014 showed no aneurysm. Since I last saw himpatient denies dyspnea, chest pain, syncope.  Occasional brief flutters but no sustained palpitations.  The patient does not have symptoms concerning for COVID-19 infection (fever,  chills, cough, or new shortness of breath).    Past Medical History:  Diagnosis Date  . Anxiety   . Atrial fibrillation ([place])   . BPH (benign prostatic hypertrophy)   . External thrombosed hemorrhoids   . History of colonoscopy   . HTN (hypertension)   . Hx of colonic polyps   . Hypothyroidism   . Low HDL (under 40)   . Nephrolithiasis    Past Surgical History:  Procedure Laterality Date  . APPENDECTOMY    . cyst tongue  1990   benign  . INGUINAL HERNIA REPAIR Right 1990  . INGUINAL HERNIA REPAIR Left 1948  . THYROIDECTOMY, PARTIAL  1990  . TONSILLECTOMY  1948     Current Meds  Medication Sig  . amLODipine (NORVASC) 10 MG tablet TAKE 1 TABLET BY MOUTH DAILY  . aspirin 325 MG tablet Take 1 tablet by mouth daily.  . Fish Oil OIL Take 1 tablet by mouth daily.    . flecainide (TAMBOCOR) 50 MG tablet TAKE 2 TABLETS BY MOUTH EVERY DAY  . levothyroxine (SYNTHROID) 75 MCG tablet TAKE 1 TABLET(75 MCG) BY MOUTH DAILY  . losartan-hydrochlorothiazide (HYZAAR) 100-12.5 MG tablet Take 1 tablet by mouth daily.  . nebivolol (BYSTOLIC) 2.5 MG tablet TAKE 1 TABLET(2.5 MG) BY MOUTH DAILY  . potassium chloride SA (K-DUR) 20 MEQ tablet TAKE ONE TABLET BY MOUTH TWICE DAILY  . pravastatin (PRAVACHOL) 20 MG tablet Take 1 tablet (20 mg total) by mouth daily.     Allergies:   Penicillins   Social History   Tobacco Use  . Smoking status: Former Smoker    Packs/day: 2.00    Years: 10.00  Pack years: 20.00    Types: Cigars, Cigarettes    Quit date: 2005    Years since quitting: 15.6  . Smokeless tobacco: Never Used  . Tobacco comment: quit smoking cigars in 2017--04/15/18  Substance Use Topics  . Alcohol use: No    Alcohol/week: 0.0 standard drinks  . Drug use: No     Family Hx: The patient's family history includes AAA (abdominal aortic aneurysm) (age of onset: 72) in his father; Alcohol abuse in his unknown relative; Breast cancer in his unknown relative; Heart attack in his father.  There is no history of Cancer, Early death, Diabetes, Heart disease, Hyperlipidemia, Hypertension, Kidney disease, Stroke, or Colon cancer.  ROS:   Please see the history of present illness.    No Fever, chills  or productive cough All other systems reviewed and are negative.   Recent Labs: 03/07/2019: BUN 18; Creatinine, Ser 1.00; Hemoglobin 17.0; Platelets 238.0; Potassium 4.3; Sodium 140; TSH 3.11   Recent Lipid Panel Lab Results  Component Value Date/Time   CHOL 148 10/24/2018 08:35 AM   TRIG 216.0 (H) 03/07/2019 10:05 AM   HDL 34.50 (L) 10/24/2018 08:35 AM   CHOLHDL 4 10/24/2018 08:35 AM   LDLCALC 73 10/24/2018 08:35 AM   LDLDIRECT 95.0 10/22/2016 02:05 PM    Wt Readings from Last 3 Encounters:  05/11/19 185 lb 3.2 oz (84 kg)  03/07/19 191 lb 12 oz (87 kg)  10/24/18 199 lb 0.6 oz (90.3 kg)     Objective:    Vital Signs:  BP 139/75   Pulse (!) 51   Ht 6' (1.829 m)   Wt 185 lb 3.2 oz (84 kg)   BMI 25.12 kg/m    VITAL SIGNS:  reviewed NAD Answers questions appropriately Normal affect Remainder of physical examination not performed (telehealth visit; coronavirus pandemic)  ASSESSMENT & PLAN:    1. Paroxysmal atrial fibrillation-patient remains in sinus rhythm.  Continue flecainide and Bystolic.  Continue aspirin but decreased to 81 mg daily.  I again discussed anticoagulation with the patient.  Given his age and history of hypertension (CHADSvasc 2) I have recommended long-term anticoagulation.  However he declines.  He understands the higher risk of CVA with aspirin compared to anticoagulation.  He will contact us if he would be agreeable to start 1 of these medications in the future. 2. Hypertension-blood pressures controlled today.  Continue present medications and follow. 3. Hyperlipidemia-continue statin.  COVID-19 Education: The importance of social distancing was discussed today.  Time:   Today, I have spent 20 minutes with the patient with telehealth  technology discussing the above problems.     Medication Adjustments/Labs and Tests Ordered: Current medicines are reviewed at length with the patient today.  Concerns regarding medicines are outlined above.   Tests Ordered: No orders of the defined types were placed in this encounter.   Medication Changes: No orders of the defined types were placed in this encounter.   Follow Up:  Virtual Visit or In Person in 1 year(s)  Signed, Kirk Ruths, MD  05/11/2019 7:52 AM    [place]

## 2019-05-11 ENCOUNTER — Telehealth (INDEPENDENT_AMBULATORY_CARE_PROVIDER_SITE_OTHER): Payer: BC Managed Care – PPO | Admitting: Cardiology

## 2019-05-11 ENCOUNTER — Encounter: Payer: Self-pay | Admitting: Cardiology

## 2019-05-11 VITALS — BP 139/75 | HR 51 | Ht 72.0 in | Wt 185.2 lb

## 2019-05-11 DIAGNOSIS — E7800 Pure hypercholesterolemia, unspecified: Secondary | ICD-10-CM

## 2019-05-11 DIAGNOSIS — I10 Essential (primary) hypertension: Secondary | ICD-10-CM | POA: Diagnosis not present

## 2019-05-11 DIAGNOSIS — I480 Paroxysmal atrial fibrillation: Secondary | ICD-10-CM | POA: Diagnosis not present

## 2019-05-11 NOTE — Patient Instructions (Signed)
Medication Instructions:  NO CHANGE If you need a refill on your cardiac medications before your next appointment, please call your pharmacy.   Lab work: If you have labs (blood work) drawn today and your tests are completely normal, you will receive your results only by: . MyChart Message (if you have MyChart) OR . A paper copy in the mail If you have any lab test that is abnormal or we need to change your treatment, we will call you to review the results.  Follow-Up: At [facility], you and your health needs are our priority.  As part of our continuing mission to provide you with exceptional heart care, we have created designated Provider Care Teams.  These Care Teams include your primary Cardiologist (physician) and Advanced Practice Providers (APPs -  Physician Assistants and Nurse Practitioners) who all work together to provide you with the care you need, when you need it. You will need a follow up appointment in 12 months.  Please call our office 2 months in advance to schedule this appointment.  You may see Brian Crenshaw, MD or one of the following Advanced Practice Providers on your designated Care Team:   Luke Kilroy, PA-C Krista Kroeger, PA-C . Callie Goodrich, PA-C     

## 2019-05-19 ENCOUNTER — Encounter: Payer: Self-pay | Admitting: Family

## 2019-05-19 ENCOUNTER — Other Ambulatory Visit: Payer: Self-pay

## 2019-05-19 ENCOUNTER — Other Ambulatory Visit (INDEPENDENT_AMBULATORY_CARE_PROVIDER_SITE_OTHER): Payer: PPO

## 2019-05-19 ENCOUNTER — Ambulatory Visit (INDEPENDENT_AMBULATORY_CARE_PROVIDER_SITE_OTHER): Payer: PPO | Admitting: Family

## 2019-05-19 VITALS — BP 110/70 | HR 63 | Temp 98.0°F | Ht 72.0 in | Wt 188.0 lb

## 2019-05-19 DIAGNOSIS — R233 Spontaneous ecchymoses: Secondary | ICD-10-CM | POA: Diagnosis not present

## 2019-05-19 LAB — COMPREHENSIVE METABOLIC PANEL
ALT: 14 U/L (ref 0–53)
AST: 16 U/L (ref 0–37)
Albumin: 4.2 g/dL (ref 3.5–5.2)
Alkaline Phosphatase: 64 U/L (ref 39–117)
BUN: 22 mg/dL (ref 6–23)
CO2: 30 mEq/L (ref 19–32)
Calcium: 9.7 mg/dL (ref 8.4–10.5)
Chloride: 102 mEq/L (ref 96–112)
Creatinine, Ser: 0.92 mg/dL (ref 0.40–1.50)
GFR: 80.43 mL/min (ref >60.00)
Glucose, Bld: 96 mg/dL (ref 70–99)
Potassium: 4 mEq/L (ref 3.5–5.1)
Sodium: 140 mEq/L (ref 135–145)
Total Bilirubin: 1.4 mg/dL — ABNORMAL HIGH (ref 0.2–1.2)
Total Protein: 6.9 g/dL (ref 6.0–8.3)

## 2019-05-19 LAB — CBC WITH DIFFERENTIAL/PLATELET
Basophils Absolute: 0.1 10*3/uL (ref 0.0–0.1)
Basophils Relative: 0.8 % (ref 0.0–3.0)
Eosinophils Absolute: 0.2 10*3/uL (ref 0.0–0.7)
Eosinophils Relative: 2.5 % (ref 0.0–5.0)
HCT: 47.3 % (ref 39.0–52.0)
Hemoglobin: 16.2 g/dL (ref 13.0–17.0)
Lymphocytes Relative: 20 % (ref 12.0–46.0)
Lymphs Abs: 1.5 10*3/uL (ref 0.7–4.0)
MCHC: 34.2 g/dL (ref 30.0–36.0)
MCV: 90.9 fl (ref 78.0–100.0)
Monocytes Absolute: 0.7 10*3/uL (ref 0.1–1.0)
Monocytes Relative: 10.1 % (ref 3.0–12.0)
Neutro Abs: 4.9 10*3/uL (ref 1.4–7.7)
Neutrophils Relative %: 66.6 % (ref 43.0–77.0)
Platelets: 239 10*3/uL (ref 150.0–400.0)
RBC: 5.21 Mil/uL (ref 4.22–5.81)
RDW: 13 % (ref 11.5–15.5)
WBC: 7.3 10*3/uL (ref 4.0–10.5)

## 2019-05-19 NOTE — Progress Notes (Signed)
Kevin Bradley is a 73 y.o. male with the following history as recorded in EpicCare:  Patient Active Problem List   Diagnosis Date Noted  . Vitamin D deficiency 12/14/2017  . Erythrocytosis 12/14/2017  . Hyperlipidemia with target LDL less than 100 09/17/2015  . Hypertriglyceridemia 02/10/2014  . COPD (chronic obstructive pulmonary disease) with chronic bronchitis ([place]) 11/06/2011  . Routine general medical examination at a health care facility 09/19/2011  . Adrenal nodule ([place]) 06/25/2011  . Prediabetes 07/11/2010  . Paroxysmal atrial fibrillation ([place]) 12/03/2009  . Hypothyroidism 05/17/2009  . Fear of flying 05/17/2009  . Essential hypertension 05/17/2009  . BPH without obstruction/lower urinary tract symptoms 05/17/2009    Current Outpatient Medications  Medication Sig Dispense Refill  . amLODipine (NORVASC) 10 MG tablet TAKE 1 TABLET BY MOUTH DAILY 180 tablet 3  . aspirin 325 MG tablet Take 1 tablet by mouth daily.    . Fish Oil OIL Take 1 tablet by mouth daily.      . flecainide (TAMBOCOR) 50 MG tablet TAKE 2 TABLETS BY MOUTH EVERY DAY 180 tablet 2  . levothyroxine (SYNTHROID) 75 MCG tablet TAKE 1 TABLET(75 MCG) BY MOUTH DAILY 90 tablet 1  . losartan-hydrochlorothiazide (HYZAAR) 100-12.5 MG tablet Take 1 tablet by mouth daily. 90 tablet 0  . nebivolol (BYSTOLIC) 2.5 MG tablet TAKE 1 TABLET(2.5 MG) BY MOUTH DAILY 90 tablet 0  . potassium chloride SA (K-DUR) 20 MEQ tablet TAKE ONE TABLET BY MOUTH TWICE DAILY 180 tablet 0  . pravastatin (PRAVACHOL) 20 MG tablet Take 1 tablet (20 mg total) by mouth daily. 90 tablet 3   No current facility-administered medications for this visit.     Allergies: Penicillins  Past Medical History:  Diagnosis Date  . Anxiety   . Atrial fibrillation ([place])   . BPH (benign prostatic hypertrophy)   . External thrombosed hemorrhoids   . History of colonoscopy   . HTN (hypertension)   . Hx of colonic polyps   . Hypothyroidism   . Low HDL (under  40)   . Nephrolithiasis     Past Surgical History:  Procedure Laterality Date  . APPENDECTOMY    . cyst tongue  1990   benign  . INGUINAL HERNIA REPAIR Right 1990  . INGUINAL HERNIA REPAIR Left 1948  . THYROIDECTOMY, PARTIAL  1990  . TONSILLECTOMY  1948    Family History  Problem Relation Age of Onset  . Heart attack Father   . AAA (abdominal aortic aneurysm) Father 84  . Alcohol abuse Unknown   . Breast cancer Unknown   . Cancer Neg Hx   . Early death Neg Hx   . Diabetes Neg Hx   . Heart disease Neg Hx   . Hyperlipidemia Neg Hx   . Hypertension Neg Hx   . Kidney disease Neg Hx   . Stroke Neg Hx   . Colon cancer Neg Hx     Social History   Tobacco Use  . Smoking status: Former Smoker    Packs/day: 2.00    Years: 10.00    Pack years: 20.00    Types: Cigars, Cigarettes    Quit date: 2005    Years since quitting: 15.7  . Smokeless tobacco: Never Used  . Tobacco comment: quit smoking cigars in 2017--04/15/18  Substance Use Topics  . Alcohol use: No    Alcohol/week: 0.0 standard drinks    Subjective:  Patient presents with concerns for localized "rash" on both of his lower extremities x 3  days; does actually feel that the are is improving; no pain or itching; denies any new soaps, foods, detergents or medications. Does have history of A. Fib- is currently only taking Asa/ prefers not to use Eliquis; does wear compression stockings regularly;      Objective:  Vitals:   05/19/19 0840  BP: 110/70  Pulse: 63  Temp: 98 F (36.7 C)  TempSrc: Oral  SpO2: 98%  Weight: 188 lb (85.3 kg)  Height: 6' (1.829 m)    General: Well developed, well nourished, in no acute distress  Skin : Warm and dry. Bilateral erythematous macular rash on lower extremities Head: Normocephalic and atraumatic  Eyes: Sclera and conjunctiva clear; pupils round and reactive to light; extraocular movements intact  Ears: External normal; canals clear; tympanic membranes normal  Oropharynx: Pink,  supple. No suspicious lesions  Neck: Supple without thyromegaly, adenopathy  Lungs: Respirations unlabored; clear to auscultation bilaterally without wheeze, rales, rhonchi  CVS exam: normal rate and regular rhythm.  Musculoskeletal: No deformities; no active joint inflammation  Extremities: No edema, cyanosis, clubbing  Vessels: Symmetric bilaterally  Neurologic: Alert and oriented; speech intact; face symmetrical; moves all extremities well; CNII-XII intact without focal deficit   Assessment:  1. Petechiae     Plan:  Suspect vascular source; check labs; encouraged to elevate labs as much as possible; work note for today and Monday; follow-up to be determined.   No follow-ups on file.  Orders Placed This Encounter  Procedures  . CBC w/Diff    Standing Status:   Future    Number of Occurrences:   1    Standing Expiration Date:   05/18/2020  . Comp Met (CMET)    Standing Status:   Future    Number of Occurrences:   1    Standing Expiration Date:   05/18/2020    Requested Prescriptions    No prescriptions requested or ordered in this encounter

## 2019-05-22 ENCOUNTER — Other Ambulatory Visit: Payer: Self-pay | Admitting: Family

## 2019-05-22 DIAGNOSIS — I878 Other specified disorders of veins: Secondary | ICD-10-CM

## 2019-05-23 ENCOUNTER — Encounter: Payer: Self-pay | Admitting: Family

## 2019-05-24 DIAGNOSIS — I480 Paroxysmal atrial fibrillation: Secondary | ICD-10-CM

## 2019-05-26 ENCOUNTER — Ambulatory Visit (HOSPITAL_COMMUNITY): Payer: PPO | Attending: Cardiovascular Disease

## 2019-05-26 ENCOUNTER — Other Ambulatory Visit: Payer: Self-pay

## 2019-05-26 DIAGNOSIS — I480 Paroxysmal atrial fibrillation: Secondary | ICD-10-CM

## 2019-05-31 ENCOUNTER — Other Ambulatory Visit: Payer: Self-pay | Admitting: Internal Medicine

## 2019-05-31 DIAGNOSIS — I10 Essential (primary) hypertension: Secondary | ICD-10-CM

## 2019-05-31 MED ORDER — LOSARTAN POTASSIUM-HCTZ 100-12.5 MG PO TABS: 1.0000 | ORAL_TABLET | Freq: Every day | ORAL | 0 refills | Status: DC

## 2019-06-19 ENCOUNTER — Other Ambulatory Visit: Payer: Self-pay

## 2019-06-19 MED ORDER — NEBIVOLOL HCL 2.5 MG PO TABS: ORAL_TABLET | ORAL | 0 refills | Status: DC

## 2019-06-20 ENCOUNTER — Other Ambulatory Visit: Payer: Self-pay

## 2019-06-20 MED ORDER — NEBIVOLOL HCL 2.5 MG PO TABS: ORAL_TABLET | ORAL | 0 refills | Status: DC

## 2019-07-02 ENCOUNTER — Encounter: Payer: Self-pay | Admitting: Internal Medicine

## 2019-07-04 ENCOUNTER — Other Ambulatory Visit: Payer: Self-pay

## 2019-07-04 DIAGNOSIS — Z20822 Contact with and (suspected) exposure to covid-19: Secondary | ICD-10-CM

## 2019-07-06 LAB — NOVEL CORONAVIRUS, NAA: SARS-CoV-2, NAA: NOT DETECTED

## 2019-07-07 ENCOUNTER — Other Ambulatory Visit: Payer: Self-pay

## 2019-07-07 DIAGNOSIS — I878 Other specified disorders of veins: Secondary | ICD-10-CM

## 2019-07-10 ENCOUNTER — Other Ambulatory Visit: Payer: Self-pay | Admitting: *Deleted

## 2019-07-10 MED ORDER — FLECAINIDE ACETATE 50 MG PO TABS: 100.0000 mg | ORAL_TABLET | Freq: Every day | ORAL | 2 refills | Status: DC

## 2019-07-11 ENCOUNTER — Telehealth (HOSPITAL_COMMUNITY): Payer: Self-pay

## 2019-07-11 NOTE — Telephone Encounter (Signed)

## 2019-07-12 ENCOUNTER — Ambulatory Visit (INDEPENDENT_AMBULATORY_CARE_PROVIDER_SITE_OTHER): Payer: PPO | Admitting: Vascular Surgery

## 2019-07-12 ENCOUNTER — Encounter: Payer: Self-pay | Admitting: Vascular Surgery

## 2019-07-12 ENCOUNTER — Other Ambulatory Visit: Payer: Self-pay

## 2019-07-12 ENCOUNTER — Ambulatory Visit (HOSPITAL_COMMUNITY)
Admission: RE | Admit: 2019-07-12 | Discharge: 2019-07-12 | Disposition: A | Discharge status: Home / Self Care | Payer: PPO | Source: Ambulatory Visit | Attending: Vascular Surgery | Admitting: Vascular Surgery

## 2019-07-12 VITALS — BP 118/67 | HR 67 | Temp 98.0°F | Resp 18 | Ht 72.0 in | Wt 185.0 lb

## 2019-07-12 DIAGNOSIS — I872 Venous insufficiency (chronic) (peripheral): Secondary | ICD-10-CM

## 2019-07-12 DIAGNOSIS — I878 Other specified disorders of veins: Secondary | ICD-10-CM | POA: Insufficient documentation

## 2019-07-12 NOTE — Progress Notes (Signed)
Referring Physician: Jodi Mourning  Patient name: Kevin Bradley MRN: [id number] DOB: October 28, 1945 Sex: male  REASON FOR CONSULT: Bilateral lower extremity rash possible stasis dermatitis  HPI: Kevin Bradley is a 73 y.o. male, who noticed about 6 weeks ago that he was developing a rash on both of his lower extremities.  He seems to think that most of this is usually related to after working a full day at [employer] where he is standing on his feet all day.  He does wear a generic lightweight compression stocking.  He does not seem to think that this is an allergic reaction to this but has not really noticed 1 where the other.  The rash distribution is in the same area as the stocking.  He has no family history of varicose veins.  He has no history of DVT.  He has never had any complete skin breakdown.  He does developed a sensation of heaviness fullness and aching in his lower extremities after he has been on his feet all day.  He sometimes will get some swelling but this is improved with compression stockings.  The rash is not really bothersome to him.  He just wanted to see what was causing it.  Other medical problems include and is on aspirin instead of anticoagulation due to patient preference.  Other chronic medical problems include hypertension hypothyroidism both of which have been stable.  Past Medical History:  Diagnosis Date  . Anxiety   . Atrial fibrillation ([place])   . BPH (benign prostatic hypertrophy)   . External thrombosed hemorrhoids   . History of colonoscopy   . HTN (hypertension)   . Hx of colonic polyps   . Hypothyroidism   . Low HDL (under 40)   . Nephrolithiasis    Past Surgical History:  Procedure Laterality Date  . APPENDECTOMY    . cyst tongue  1990   benign  . INGUINAL HERNIA REPAIR Right 1990  . INGUINAL HERNIA REPAIR Left 1948  . THYROIDECTOMY, PARTIAL  1990  . TONSILLECTOMY  1948    Family History  Problem Relation Age of Onset  . Heart attack Father    . AAA (abdominal aortic aneurysm) Father 69  . Alcohol abuse Other   . Breast cancer Other   . Cancer Neg Hx   . Early death Neg Hx   . Diabetes Neg Hx   . Heart disease Neg Hx   . Hyperlipidemia Neg Hx   . Hypertension Neg Hx   . Kidney disease Neg Hx   . Stroke Neg Hx   . Colon cancer Neg Hx     SOCIAL HISTORY: Social History   Socioeconomic History  . Marital status: Married    Spouse name: Not on file  . Number of children: Not on file  . Years of education: Not on file  . Highest education level: Not on file  Occupational History  . Occupation: [employer] operational officer  . Financial resource strain: Not on file  . Food insecurity    Worry: Not on file    Inability: Not on file  . Transportation needs    Medical: Not on file    Non-medical: Not on file  Tobacco Use  . Smoking status: Former Smoker    Packs/day: 2.00    Years: 10.00    Pack years: 20.00    Types: Cigars, Cigarettes    Quit date: 2005    Years since quitting: 15.8  . Smokeless  tobacco: Never Used  . Tobacco comment: quit smoking cigars in 2017--04/15/18  Substance and Sexual Activity  . Alcohol use: No    Alcohol/week: 0.0 standard drinks  . Drug use: No  . Sexual activity: Yes  Lifestyle  . Physical activity    Days per week: Not on file    Minutes per session: Not on file  . Stress: Not on file  Relationships  . Social Herbalist on phone: Not on file    Gets together: Not on file    Attends religious service: Not on file    Active member of club or organization: Not on file    Attends meetings of clubs or organizations: Not on file    Relationship status: Not on file  . Intimate partner violence    Fear of current or ex partner: Not on file    Emotionally abused: Not on file    Physically abused: Not on file    Forced sexual activity: Not on file  Other Topics Concern  . Not on file  Social History Narrative  . Not on file    Allergies  Allergen Reactions  .  Penicillins     REACTION: passed out    Current Outpatient Medications  Medication Sig Dispense Refill  . amLODipine (NORVASC) 10 MG tablet TAKE 1 TABLET BY MOUTH DAILY 180 tablet 3  . aspirin 325 MG tablet Take 81 tablets by mouth daily.     . Fish Oil OIL Take 1 tablet by mouth daily.      . flecainide (TAMBOCOR) 50 MG tablet Take 2 tablets (100 mg total) by mouth daily. 180 tablet 2  . levothyroxine (SYNTHROID) 75 MCG tablet TAKE 1 TABLET(75 MCG) BY MOUTH DAILY 90 tablet 1  . losartan-hydrochlorothiazide (HYZAAR) 100-12.5 MG tablet Take 1 tablet by mouth daily. 90 tablet 0  . nebivolol (BYSTOLIC) 2.5 MG tablet TAKE 1 TABLET(2.5 MG) BY MOUTH DAILY 90 tablet 0  . potassium chloride SA (K-DUR) 20 MEQ tablet TAKE ONE TABLET BY MOUTH TWICE DAILY 180 tablet 0  . pravastatin (PRAVACHOL) 20 MG tablet Take 1 tablet (20 mg total) by mouth daily. (Patient not taking: Reported on 07/12/2019) 90 tablet 3   No current facility-administered medications for this visit.     ROS:   General:  No weight loss, Fever, chills  HEENT: No recent headaches, no nasal bleeding, no visual changes, no sore throat  Neurologic: No dizziness, blackouts, seizures. No recent symptoms of stroke or mini- stroke. No recent episodes of slurred speech, or temporary blindness.  Cardiac: No recent episodes of chest pain/pressure, no shortness of breath at rest.  No shortness of breath with exertion.  + history of atrial fibrillation or irregular heartbeat  Vascular: No history of rest pain in feet.  No history of claudication.  No history of non-healing ulcer, No history of DVT   Pulmonary: No home oxygen, no productive cough, no hemoptysis,  No asthma or wheezing  Musculoskeletal:  [ ]  Arthritis, [ ]  Low back pain,  [ ]  Joint pain  Hematologic:No history of hypercoagulable state.  No history of easy bleeding.  No history of anemia  Gastrointestinal: No hematochezia or melena,  No gastroesophageal reflux, no trouble  swallowing  Urinary: [ ]  chronic Kidney disease, [ ]  on HD - [ ]  MWF or [ ]  TTHS, [ ]  Burning with urination, [ ]  Frequent urination, [ ]  Difficulty urinating;   Skin: + rashes  Psychological: No history of  anxiety,  No history of depression   Physical Examination  Vitals:   07/12/19 1426  BP: 118/67  Pulse: 67  Resp: 18  Temp: 98 F (36.7 C)  TempSrc: Temporal  SpO2: 98%  Weight: 185 lb (83.9 kg)  Height: 6' (1.829 m)    Body mass index is 25.09 kg/m.  General:  Alert and oriented, no acute distress HEENT: Normal Cardiac: Regular Rate and Rhythm Skin: Bilateral diffuse scattered 1 mm red circular lesions extending from the ankle up to the knee bilaterally, no ulcer, no obvious skin varicosities Extremity Pulses:  2+ radial, brachial, femoral, dorsalis pedis, posterior tibial pulses bilaterally Musculoskeletal: No deformity trace pretibial and pedal edema  Neurologic: Upper and lower extremity motor 5/5 and symmetric  DATA:  Patient had a venous reflux exam today which did show some reflux in the common femoral vein.  However the superficial venous system did not show any significant reflux.  ASSESSMENT: Stasis dermatitis secondary to venous hypertension with a component of deep vein reflux.   PLAN: Discussed with patient mainstay of therapy is going to be compression therapy as his superficial venous system does not have reflux.  He probably has venous hypertension as well as an element of deep vein reflux which is not treated surgically.  Discussed with patient they have wearing a tighter compression stocking 30 to 40 mmHg or at least 20 to 30 mmHg after a trial of not going with his stockings for several days just to make sure that this is not a contact dermatitis reaction to the material in his compression stockings.  He will follow-up with [place] on an as-needed basis.  Ruta Hinds, MD [facility] Office: [phone] Pager:  [phone]

## 2019-07-28 ENCOUNTER — Other Ambulatory Visit: Payer: Self-pay

## 2019-07-28 DIAGNOSIS — Z20822 Contact with and (suspected) exposure to covid-19: Secondary | ICD-10-CM

## 2019-07-31 LAB — NOVEL CORONAVIRUS, NAA: SARS-CoV-2, NAA: NOT DETECTED

## 2019-08-25 ENCOUNTER — Encounter: Payer: Self-pay | Admitting: Internal Medicine

## 2019-08-29 ENCOUNTER — Encounter: Payer: Self-pay | Admitting: Internal Medicine

## 2019-09-04 ENCOUNTER — Encounter: Payer: Self-pay | Admitting: Internal Medicine

## 2019-09-04 ENCOUNTER — Other Ambulatory Visit: Payer: Self-pay | Admitting: Internal Medicine

## 2019-09-04 ENCOUNTER — Ambulatory Visit (INDEPENDENT_AMBULATORY_CARE_PROVIDER_SITE_OTHER): Payer: PPO | Admitting: Internal Medicine

## 2019-09-04 ENCOUNTER — Other Ambulatory Visit: Payer: Self-pay

## 2019-09-04 VITALS — BP 136/78 | HR 64 | Temp 97.9°F | Resp 16 | Ht 72.0 in | Wt 186.4 lb

## 2019-09-04 DIAGNOSIS — E039 Hypothyroidism, unspecified: Secondary | ICD-10-CM | POA: Diagnosis not present

## 2019-09-04 DIAGNOSIS — I10 Essential (primary) hypertension: Secondary | ICD-10-CM

## 2019-09-04 DIAGNOSIS — R7303 Prediabetes: Secondary | ICD-10-CM | POA: Diagnosis not present

## 2019-09-04 DIAGNOSIS — E785 Hyperlipidemia, unspecified: Secondary | ICD-10-CM | POA: Diagnosis not present

## 2019-09-04 DIAGNOSIS — I480 Paroxysmal atrial fibrillation: Secondary | ICD-10-CM

## 2019-09-04 DIAGNOSIS — E559 Vitamin D deficiency, unspecified: Secondary | ICD-10-CM

## 2019-09-04 DIAGNOSIS — E781 Pure hyperglyceridemia: Secondary | ICD-10-CM | POA: Diagnosis not present

## 2019-09-04 DIAGNOSIS — Z23 Encounter for immunization: Secondary | ICD-10-CM | POA: Insufficient documentation

## 2019-09-04 LAB — BASIC METABOLIC PANEL
BUN: 21 mg/dL (ref 6–23)
CO2: 30 mEq/L (ref 19–32)
Calcium: 9.6 mg/dL (ref 8.4–10.5)
Chloride: 102 mEq/L (ref 96–112)
Creatinine, Ser: 1.05 mg/dL (ref 0.40–1.50)
GFR: 68.99 mL/min (ref >60.00)
Glucose, Bld: 107 mg/dL — ABNORMAL HIGH (ref 70–99)
Potassium: 4.3 mEq/L (ref 3.5–5.1)
Sodium: 139 mEq/L (ref 135–145)

## 2019-09-04 LAB — LIPID PANEL
Cholesterol: 161 mg/dL (ref 0–200)
HDL: 39.5 mg/dL (ref >39.00)
LDL Cholesterol: 87 mg/dL (ref 0–99)
NonHDL: 121.79
Total CHOL/HDL Ratio: 4
Triglycerides: 174 mg/dL — ABNORMAL HIGH (ref 0.0–149.0)
VLDL: 34.8 mg/dL (ref 0.0–40.0)

## 2019-09-04 LAB — VITAMIN D 25 HYDROXY (VIT D DEFICIENCY, FRACTURES): VITD: 21.37 ng/mL — ABNORMAL LOW (ref 30.00–100.00)

## 2019-09-04 LAB — TSH: TSH: 2.33 u[IU]/mL (ref 0.35–4.50)

## 2019-09-04 LAB — HEMOGLOBIN A1C: Hgb A1c MFr Bld: 5.8 % (ref 4.6–6.5)

## 2019-09-04 MED ORDER — SHINGRIX 50 MCG/0.5ML IM SUSR: 0.5000 mL | Freq: Once | INTRAMUSCULAR | 1 refills | Status: AC

## 2019-09-04 MED ORDER — CHOLECALCIFEROL 50 MCG (2000 UT) PO TABS: 1.0000 | ORAL_TABLET | Freq: Every day | ORAL | 1 refills | Status: DC

## 2019-09-04 MED ORDER — LOSARTAN POTASSIUM-HCTZ 100-12.5 MG PO TABS: 1.0000 | ORAL_TABLET | Freq: Every day | ORAL | 0 refills | Status: DC

## 2019-09-04 MED ORDER — POTASSIUM CHLORIDE CRYS ER 20 MEQ PO TBCR: 20.0000 meq | EXTENDED_RELEASE_TABLET | Freq: Two times a day (BID) | ORAL | 0 refills | Status: DC

## 2019-09-04 NOTE — Patient Instructions (Signed)
Atrial Fibrillation Atrial fibrillation is a type of irregular or rapid heartbeat (arrhythmia). In atrial fibrillation, the top part of the heart (atria) quivers in a chaotic pattern. This makes the heart unable to pump blood normally. Having atrial fibrillation can increase your risk for other health problems, such as:  Blood can pool in the atria and form clots. If a clot travels to the brain, it can cause a stroke.  The heart muscle may weaken from the irregular blood flow. This can cause heart failure. Atrial fibrillation may start suddenly and stop on its own, or it may become a long-lasting problem. What are the causes? This condition is caused by some heart-related conditions or procedures, including:  High blood pressure. This is the most common cause.  Heart failure.  Heart valve conditions.  Inflammation of the sac that surrounds the heart (pericarditis).  Heart surgery.  Coronary artery disease.  Certain heart rhythm disorders, such as Wolf-Parkinson-White syndrome. Other causes include:  Pneumonia.  Obstructive sleep apnea.  Lung cancer.  Thyroid problems, especially if the thyroid is overactive (hyperthyroidism).  Excessive alcohol or drug use. Sometimes, the cause of this condition is not known. What increases the risk? This condition is more likely to develop in:  Older people.  People who smoke.  People who have diabetes mellitus.  People who are overweight (obese).  Athletes who exercise vigorously.  People who have a family history. What are the signs or symptoms? Symptoms of this condition include:  A feeling that your heart is beating rapidly or irregularly.  A feeling of discomfort or pain in your chest.  Shortness of breath.  Sudden light-headedness or weakness.  Getting tired easily during exercise. In some cases, there are no symptoms. How is this diagnosed? Your health care provider may be able to detect atrial fibrillation when  taking your pulse. If detected, this condition may be diagnosed with:  Electrocardiogram (ECG).  Ambulatory cardiac monitor. This device records your heartbeats for 24 hours or more.  Transthoracic echocardiogram (TTE) to evaluate how blood flows through your heart.  Transesophageal echocardiogram (TEE) to view more detailed images of your heart.  A stress test.  Imaging tests, such as a CT scan or chest X-ray.  Blood tests. How is this treated? This condition may be treated with:  Medicines to slow down the heart rate or bring the heart's rhythm back to normal.  Medicines to prevent blood clots from forming.  Electrical cardioversion. This delivers a low-energy shock to the heart to reset its rhythm.  Ablation. This procedure destroys the part of the heart tissue that sends abnormal signals.  Left atrial appendage occlusion/excision. This seals off a common place in the atria where blood clots can form (left atrial appendage). The goal of treatment is to prevent blood clots from forming and to keep your heart beating at a normal rate and rhythm. Treatment depends on underlying medical conditions and how you feel when you are experiencing fibrillation. Follow these instructions at home: Medicines  Take over-the counter and prescription medicines only as told by your health care provider.  If your health care provider prescribed a blood-thinning medicine (anticoagulant), take it exactly as told. Taking too much blood-thinning medicine can cause bleeding. Taking too little can enable a blood clot to form and travel to the brain, causing a stroke. Lifestyle      Do not use any products that contain nicotine or tobacco, such as cigarettes and e-cigarettes. If you need help quitting, ask your health   care provider.  Do not drink beverages that contain caffeine, such as coffee, soda, and tea.  Follow diet instructions as told by your health care provider.  Exercise regularly as  told by your health care provider.  Do not drink alcohol. General instructions  If you have obstructive sleep apnea, manage your condition as told by your health care provider.  Maintain a healthy weight. Do not use diet pills unless your health care provider approves. Diet pills may make heart problems worse.  Keep all follow-up visits as told by your health care provider. This is important. Contact a health care provider if you:  Notice a change in the rate, rhythm, or strength of your heartbeat.  Are taking an anticoagulant and you notice increased bruising.  Tire more easily when you exercise or exert yourself.  Have a sudden change in weight. Get help right away if you have:   Chest pain, abdominal pain, sweating, or weakness.  Difficulty breathing.  Blood in your vomit, stool (feces), or urine.  Any symptoms of a stroke. "BE FAST" is an easy way to remember the main warning signs of a stroke: ? B - Balance. Signs are dizziness, sudden trouble walking, or loss of balance. ? E - Eyes. Signs are trouble seeing or a sudden change in vision. ? F - Face. Signs are sudden weakness or numbness of the face, or the face or eyelid drooping on one side. ? A - Arms. Signs are weakness or numbness in an arm. This happens suddenly and usually on one side of the body. ? S - Speech. Signs are sudden trouble speaking, slurred speech, or trouble understanding what people say. ? T - Time. Time to call emergency services. Write down what time symptoms started.  Other signs of a stroke, such as: ? A sudden, severe headache with no known cause. ? Nausea or vomiting. ? Seizure. These symptoms may represent a serious problem that is an emergency. Do not wait to see if the symptoms will go away. Get medical help right away. Call your local emergency services (911 in the [place] Do not drive yourself to the hospital. Summary  Atrial fibrillation is a type of irregular or rapid heartbeat  (arrhythmia).  Symptoms include a feeling that your heart is beating fast or irregularly. In some cases, you may not have symptoms.  The condition is treated with medicines to slow down the heart rate or bring the heart's rhythm back to normal. You may also need blood-thinning medicines to prevent blood clots.  Get help right away if you have symptoms or signs of a stroke. This information is not intended to replace advice given to you by your health care provider. Make sure you discuss any questions you have with your health care provider. Document Released: 08/24/2005 Document Revised: 10/14/2017 Document Reviewed: 10/15/2017 Elsevier Patient Education  2020 [employer]  

## 2019-09-04 NOTE — Telephone Encounter (Signed)
Medication Refill - Medication: potassium chloride SA (K-DUR) 20 MEQ tablet, losaratan   Has the patient contacted their pharmacy? Yes.   Pt states these were supposed to be sent into the pharmacy after his appt today. Please advise.  (Agent: If no, request that the patient contact the pharmacy for the refill.) (Agent: If yes, when and what did the pharmacy advise?) [facility] [place], [address]  [place] [address]  Phone: [phone] Fax: [phone]  Open 24 hours    Preferred Pharmacy (with phone number or street name):   Agent: Please be advised that RX refills may take up to 3 business days. We ask that you follow-up with your pharmacy.

## 2019-09-04 NOTE — Progress Notes (Signed)
Subjective:  Patient ID: Kevin Bradley, male    DOB: September 02, 1946  Age: 73 y.o. MRN: [id number]  CC: Hypertension, Hypothyroidism, and Hyperlipidemia  This visit occurred during the SARS-CoV-2 public health emergency.  Safety protocols were in place, including screening questions prior to the visit, additional usage of staff PPE, and extensive cleaning of exam room while observing appropriate contact time as indicated for disinfecting solutions.   HPI Kevin Bradley presents for f/up - He continues to have rare, stable, brief palpitations.  He is active and denies any recent episodes of CP, DOE, near syncope, dizziness, lightheadedness, edema, or fatigue.  Outpatient Medications Prior to Visit  Medication Sig Dispense Refill  . amLODipine (NORVASC) 10 MG tablet TAKE 1 TABLET BY MOUTH DAILY 180 tablet 3  . aspirin 325 MG tablet Take 81 tablets by mouth daily.     . Fish Oil OIL Take 1 tablet by mouth daily.      . flecainide (TAMBOCOR) 50 MG tablet Take 2 tablets (100 mg total) by mouth daily. 180 tablet 2  . levothyroxine (SYNTHROID) 75 MCG tablet TAKE 1 TABLET(75 MCG) BY MOUTH DAILY 90 tablet 1  . nebivolol (BYSTOLIC) 2.5 MG tablet TAKE 1 TABLET(2.5 MG) BY MOUTH DAILY 90 tablet 0  . pravastatin (PRAVACHOL) 20 MG tablet Take 1 tablet (20 mg total) by mouth daily. 90 tablet 3  . losartan-hydrochlorothiazide (HYZAAR) 100-12.5 MG tablet Take 1 tablet by mouth daily. 90 tablet 0  . potassium chloride SA (K-DUR) 20 MEQ tablet TAKE ONE TABLET BY MOUTH TWICE DAILY 180 tablet 0   No facility-administered medications prior to visit.    ROS Review of Systems  Constitutional: Negative for diaphoresis, fatigue and unexpected weight change.  HENT: Negative.   Respiratory: Negative for cough, chest tightness, shortness of breath and wheezing.   Cardiovascular: Negative for chest pain, palpitations and leg swelling.  Gastrointestinal: Negative for abdominal pain, constipation, diarrhea, nausea and  vomiting.  Endocrine: Negative.  Negative for cold intolerance and heat intolerance.  Genitourinary: Negative for difficulty urinating.  Musculoskeletal: Negative.  Negative for arthralgias and myalgias.  Skin: Negative.  Negative for color change and pallor.  Neurological: Negative.  Negative for dizziness, syncope, weakness and light-headedness.  Hematological: Negative for adenopathy. Does not bruise/bleed easily.  Psychiatric/Behavioral: Negative.     Objective:  BP 136/78 (BP Location: Left Arm, Patient Position: Sitting, Cuff Size: Normal)   Pulse 64   Temp 97.9 F (36.6 C) (Oral)   Resp 16   Ht 6' (1.829 m)   Wt 186 lb 7 oz (84.6 kg)   SpO2 96%   BMI 25.29 kg/m   BP Readings from Last 3 Encounters:  09/04/19 136/78  07/12/19 118/67  05/19/19 110/70    Wt Readings from Last 3 Encounters:  09/04/19 186 lb 7 oz (84.6 kg)  07/12/19 185 lb (83.9 kg)  05/19/19 188 lb (85.3 kg)    Physical Exam Vitals reviewed.  Constitutional:      Appearance: Normal appearance.  HENT:     Nose: Nose normal.     Mouth/Throat:     Mouth: Mucous membranes are moist.  Eyes:     General: No scleral icterus.    Conjunctiva/sclera: Conjunctivae normal.  Cardiovascular:     Rate and Rhythm: Normal rate and regular rhythm.     Heart sounds: No murmur.  Pulmonary:     Effort: Pulmonary effort is normal.     Breath sounds: No stridor. No wheezing, rhonchi or rales.  Abdominal:     General: Abdomen is flat.     Palpations: Abdomen is soft. There is no hepatomegaly, splenomegaly or mass.     Tenderness: There is no abdominal tenderness. There is no guarding.  Musculoskeletal:        General: Normal range of motion.     Cervical back: Neck supple.     Right lower leg: No edema.     Left lower leg: No edema.  Lymphadenopathy:     Cervical: No cervical adenopathy.  Skin:    General: Skin is warm and dry.  Neurological:     General: No focal deficit present.     Mental Status: He is  alert.  Psychiatric:        Mood and Affect: Mood normal.        Behavior: Behavior normal.     Lab Results  Component Value Date   WBC 7.3 05/19/2019   HGB 16.2 05/19/2019   HCT 47.3 05/19/2019   PLT 239.0 05/19/2019   GLUCOSE 107 (H) 09/04/2019   CHOL 161 09/04/2019   TRIG 174.0 (H) 09/04/2019   HDL 39.50 09/04/2019   LDLDIRECT 95.0 10/22/2016   LDLCALC 87 09/04/2019   ALT 14 05/19/2019   AST 16 05/19/2019   NA 139 09/04/2019   K 4.3 09/04/2019   CL 102 09/04/2019   CREATININE 1.05 09/04/2019   BUN 21 09/04/2019   CO2 30 09/04/2019   TSH 2.33 09/04/2019   PSA 2.68 03/07/2019   HGBA1C 5.8 09/04/2019    VAS [place] LOWER EXTREMITY VENOUS REFLUX  Result Date: 07/13/2019  Lower Venous Reflux Study Indications: Swelling, Edema, Pain, and redness. Other Indications: Patent wears compression stocking and noticed bilateral rash,                    left worse than right. Performing Technologist: Delorise Shiner RVT  Examination Guidelines: A complete evaluation includes B-mode imaging, spectral Doppler, color Doppler, and power Doppler as needed of all accessible portions of each vessel. Bilateral testing is considered an integral part of a complete examination. Limited examinations for reoccurring indications may be performed as noted. The reflux portion of the exam is performed with the patient in reverse Trendelenburg.  Right Technical Findings: No evidence of DVT, SVT, or Baker's cyst. The sapheno-femoral junction is competent. No evidence of GSV reflux. No evidence of SSV reflux. No evidence of deep venous reflux.  Left Technical Findings: No evidence of DVT, SVT, or Baker's cyst. The sapheno-femoral junction is competent. No evidence of GSV reflux. No evidence of SSV reflux. The left common femoral vein demonstrates deep venous reflux.  Venous Reflux Times Normal value < 0.5 sec +--------------+------+---------+--------+--------+ RIGHT         RefluxReflux NoDiameterComments                 Yes                            +--------------+------+---------+--------+--------+ CFV                    no                     +--------------+------+---------+--------+--------+ FV prox                no                     +--------------+------+---------+--------+--------+ Popliteal  no                     +--------------+------+---------+--------+--------+ GSV at [facility]             no     0.726           +--------------+------+---------+--------+--------+ GSV prox thigh         no     0.498           +--------------+------+---------+--------+--------+ GSV mid thigh          no     0.461           +--------------+------+---------+--------+--------+ GSV dist thigh         no     0.342           +--------------+------+---------+--------+--------+ GSV at knee            no     0.388           +--------------+------+---------+--------+--------+ GSV prox calf                 0.393           +--------------+------+---------+--------+--------+ GSV mid calf                  0.397           +--------------+------+---------+--------+--------+ SSV Pop Fossa                 0.260           +--------------+------+---------+--------+--------+ SSV prox calf          no     0.420           +--------------+------+---------+--------+--------+ SSV mid calf           no     0.356           +--------------+------+---------+--------+--------+  +--------------+------+---------+--------+--------+ LEFT          RefluxReflux NoDiameterComments                Yes                            +--------------+------+---------+--------+--------+ CFV            yes                            +--------------+------+---------+--------+--------+ FV prox                no                     +--------------+------+---------+--------+--------+ Popliteal              no                      +--------------+------+---------+--------+--------+ GSV at [facility]             no     0.739           +--------------+------+---------+--------+--------+ GSV prox thigh         no     0.534           +--------------+------+---------+--------+--------+ GSV mid thigh          no     0.603           +--------------+------+---------+--------+--------+ GSV dist thigh         no  0.438           +--------------+------+---------+--------+--------+ GSV at knee            no     0.475           +--------------+------+---------+--------+--------+ GSV prox calf                 0.429           +--------------+------+---------+--------+--------+ GSV mid calf                  0.489           +--------------+------+---------+--------+--------+ SSV Pop Fossa                 0.256           +--------------+------+---------+--------+--------+ SSV prox calf          no     0.219           +--------------+------+---------+--------+--------+ SSV mid calf           no     0.246           +--------------+------+---------+--------+--------+   Summary: Right: There is no evidence of deep vein thrombosis in the lower extremity. There is no evidence of superficial venous thrombosis. No cystic structure found in the popliteal fossa. Left: Evidence of chronic venous insufficiency is detected in the deep venous system. There is no evidence of deep vein thrombosis in the lower extremity. There is no evidence of superficial venous thrombosis. No cystic structure found in the popliteal fossa.  *See table(s) above for measurements and observations. Electronically signed by Deitra Mayo MD on 07/13/2019 at 10:30:24 AM.    Final     Assessment & Plan:   Brandy was seen today for hypertension, hypothyroidism and hyperlipidemia.  Diagnoses and all orders for this visit:  Essential hypertension-his blood pressure is well controlled.  Electrolytes and renal function are normal. -      Basic metabolic panel  Acquired hypothyroidism- His TSH is in the normal range.  He will remain on the current dose of levothyroxine. -     TSH  Prediabetes -     Hemoglobin A1c  Vitamin D deficiency- His vitamin D level is low. -     Vitamin D 25 hydroxy -     Cholecalciferol 50 MCG (2000 UT) TABS; Take 1 tablet (2,000 Units total) by mouth daily.  Hypertriglyceridemia- Improvement noted. -     Lipid panel  Hyperlipidemia with target LDL less than 100- He has achieved his LDL goal and is doing well on the statin. -     Lipid panel  Paroxysmal atrial fibrillation ([place])- His palpitations are stable.  He is not willing to be fully anticoagulated.  He will continue to follow-up with cardiology.  Need for shingles vaccine -     Zoster Vaccine Adjuvanted [facility]) injection; Inject 0.5 mLs into the muscle once for 1 dose.   I am having Kevin Bradley start on Shingrix and Cholecalciferol. I am also having him maintain his Fish Oil, pravastatin, amLODipine, levothyroxine, aspirin, nebivolol, and flecainide.  Meds ordered this encounter  Medications  . Zoster Vaccine Adjuvanted [facility]) injection    Sig: Inject 0.5 mLs into the muscle once for 1 dose.    Dispense:  0.5 mL    Refill:  1  . Cholecalciferol 50 MCG (2000 UT) TABS    Sig: Take 1 tablet (2,000 Units total) by mouth daily.  Dispense:  90 tablet    Refill:  1     Follow-up: Return in about 6 months (around 03/04/2020).  Scarlette Calico, MD

## 2019-10-06 ENCOUNTER — Other Ambulatory Visit: Payer: Self-pay

## 2019-10-06 MED ORDER — FLECAINIDE ACETATE 50 MG PO TABS: 100.0000 mg | ORAL_TABLET | Freq: Every day | ORAL | 2 refills | Status: DC

## 2019-10-11 ENCOUNTER — Other Ambulatory Visit: Payer: Self-pay | Admitting: Internal Medicine

## 2019-10-11 DIAGNOSIS — E039 Hypothyroidism, unspecified: Secondary | ICD-10-CM

## 2019-10-20 ENCOUNTER — Encounter: Payer: Self-pay | Admitting: Internal Medicine

## 2019-10-21 ENCOUNTER — Other Ambulatory Visit: Payer: Self-pay | Admitting: Internal Medicine

## 2019-10-21 DIAGNOSIS — F40243 Fear of flying: Secondary | ICD-10-CM

## 2019-10-21 MED ORDER — DIAZEPAM 2 MG PO TABS: 2.0000 mg | ORAL_TABLET | Freq: Four times a day (QID) | ORAL | 0 refills | Status: DC | PRN

## 2019-10-29 ENCOUNTER — Ambulatory Visit: Payer: PPO | Attending: Internal Medicine

## 2019-10-29 DIAGNOSIS — Z23 Encounter for immunization: Secondary | ICD-10-CM

## 2019-10-29 NOTE — Progress Notes (Signed)
   Covid-19 Vaccination Clinic  Name:  Kevin Bradley    MRN: [id number] DOB: Oct 22, 1945  10/29/2019  Mr. Kevin Bradley was observed post Covid-19 immunization for 15 minutes without incidence. He was provided with Vaccine Information Sheet and instruction to access the V-Safe system.   Mr. Kevin Bradley was instructed to call 911 with any severe reactions post vaccine: Marland Kitchen Difficulty breathing  . Swelling of your face and throat  . A fast heartbeat  . A bad rash all over your body  . Dizziness and weakness    Immunizations Administered    Name Date Dose VIS Date Route   Pfizer COVID-19 Vaccine 10/29/2019  8:14 AM 0.3 mL 08/18/2019 Intramuscular   Manufacturer: [place]   Lot: [id number]   [place]: [id number]

## 2019-11-22 ENCOUNTER — Ambulatory Visit: Payer: PPO | Attending: Internal Medicine

## 2019-11-22 DIAGNOSIS — Z23 Encounter for immunization: Secondary | ICD-10-CM

## 2019-11-22 NOTE — Progress Notes (Signed)
   Covid-19 Vaccination Clinic  Name:  Kevin Bradley    MRN: [id number] DOB: 18-Nov-1945  11/22/2019  [name] was observed post Covid-19 immunization for 15 minutes without incident. He was provided with Vaccine Information Sheet and instruction to access the V-Safe system.   [name] was instructed to call 911 with any severe reactions post vaccine: Marland Kitchen Difficulty breathing  . Swelling of face and throat  . A fast heartbeat  . A bad rash all over body  . Dizziness and weakness   Immunizations Administered    Name Date Dose VIS Date Route   Pfizer COVID-19 Vaccine 11/22/2019  8:10 AM 0.3 mL 08/18/2019 Intramuscular   Manufacturer: [place]   Lot: [id number]   [place]: [id number]

## 2019-11-26 ENCOUNTER — Other Ambulatory Visit: Payer: Self-pay | Admitting: Internal Medicine

## 2019-11-26 DIAGNOSIS — I10 Essential (primary) hypertension: Secondary | ICD-10-CM

## 2019-12-01 ENCOUNTER — Other Ambulatory Visit: Payer: Self-pay | Admitting: Internal Medicine

## 2019-12-11 ENCOUNTER — Other Ambulatory Visit: Payer: Self-pay

## 2019-12-11 MED ORDER — NEBIVOLOL HCL 2.5 MG PO TABS: ORAL_TABLET | ORAL | 0 refills | Status: DC

## 2019-12-15 ENCOUNTER — Other Ambulatory Visit: Payer: Self-pay

## 2019-12-15 DIAGNOSIS — I480 Paroxysmal atrial fibrillation: Secondary | ICD-10-CM

## 2019-12-15 MED ORDER — AMLODIPINE BESYLATE 10 MG PO TABS: 10.0000 mg | ORAL_TABLET | Freq: Every day | ORAL | 3 refills | Status: DC

## 2020-01-09 ENCOUNTER — Encounter: Payer: Self-pay | Admitting: Internal Medicine

## 2020-01-09 ENCOUNTER — Ambulatory Visit (INDEPENDENT_AMBULATORY_CARE_PROVIDER_SITE_OTHER): Payer: PPO

## 2020-01-09 ENCOUNTER — Ambulatory Visit (INDEPENDENT_AMBULATORY_CARE_PROVIDER_SITE_OTHER): Payer: PPO | Admitting: Internal Medicine

## 2020-01-09 ENCOUNTER — Other Ambulatory Visit: Payer: Self-pay

## 2020-01-09 VITALS — BP 130/70 | HR 54 | Temp 98.1°F | Resp 16 | Ht 72.0 in | Wt 181.4 lb

## 2020-01-09 DIAGNOSIS — M898X1 Other specified disorders of bone, shoulder: Secondary | ICD-10-CM

## 2020-01-09 DIAGNOSIS — M47812 Spondylosis without myelopathy or radiculopathy, cervical region: Secondary | ICD-10-CM | POA: Diagnosis not present

## 2020-01-09 DIAGNOSIS — E039 Hypothyroidism, unspecified: Secondary | ICD-10-CM | POA: Diagnosis not present

## 2020-01-09 DIAGNOSIS — M542 Cervicalgia: Secondary | ICD-10-CM

## 2020-01-09 DIAGNOSIS — M5030 Other cervical disc degeneration, unspecified cervical region: Secondary | ICD-10-CM | POA: Diagnosis not present

## 2020-01-09 DIAGNOSIS — I480 Paroxysmal atrial fibrillation: Secondary | ICD-10-CM | POA: Diagnosis not present

## 2020-01-09 DIAGNOSIS — I10 Essential (primary) hypertension: Secondary | ICD-10-CM

## 2020-01-09 DIAGNOSIS — M25511 Pain in right shoulder: Secondary | ICD-10-CM | POA: Diagnosis not present

## 2020-01-09 LAB — TSH: TSH: 1.66 u[IU]/mL (ref 0.35–4.50)

## 2020-01-09 LAB — BASIC METABOLIC PANEL
BUN: 22 mg/dL (ref 6–23)
CO2: 26 mEq/L (ref 19–32)
Calcium: 9 mg/dL (ref 8.4–10.5)
Chloride: 105 mEq/L (ref 96–112)
Creatinine, Ser: 0.87 mg/dL (ref 0.40–1.50)
GFR: 85.63 mL/min (ref >60.00)
Glucose, Bld: 114 mg/dL — ABNORMAL HIGH (ref 70–99)
Potassium: 3.8 mEq/L (ref 3.5–5.1)
Sodium: 138 mEq/L (ref 135–145)

## 2020-01-09 NOTE — Progress Notes (Signed)
Subjective:  Patient ID: Kevin Bradley, male    DOB: [birth year]  Age: 74 y.o. MRN: [id number]  CC: Hypertension and Hypothyroidism  This visit occurred during the SARS-CoV-2 public health emergency.  Safety protocols were in place, including screening questions prior to the visit, additional usage of staff PPE, and extensive cleaning of exam room while observing appropriate contact time as indicated for disinfecting solutions.    HPI EYOAB LADUCA presents for f/up - He tells me that he was taking a shower about a week ago when he noticed that the right collarbone as it approaches the breastbone was larger on the right side than the left side.  It does not cause any discomfort and he does not think it looks swollen.  He also complains of a several month history of worsening right-sided neck pain.  The pain does not radiate towards his extremities and he denies paresthesias.  He has been working on his lifestyle modifications and has been able to lose weight.  He tells me his blood pressure has been well controlled.  He denies any recent episodes of near syncope, dizziness, lightheadedness, palpitations, edema, or fatigue.  Outpatient Medications Prior to Visit  Medication Sig Dispense Refill  . amLODipine (NORVASC) 10 MG tablet Take 1 tablet (10 mg total) by mouth daily. 90 tablet 3  . aspirin 325 MG tablet Take 81 tablets by mouth daily.     . diazepam (VALIUM) 2 MG tablet Take 1 tablet (2 mg total) by mouth every 6 (six) hours as needed for anxiety. Take 1-2 by mouth 1 hour prior to the flight 30 tablet 0  . Fish Oil OIL Take 1 tablet by mouth daily.      . flecainide (TAMBOCOR) 50 MG tablet Take 2 tablets (100 mg total) by mouth daily. 180 tablet 2  . levothyroxine (SYNTHROID) 75 MCG tablet TAKE 1 TABLET(75 MCG) BY MOUTH DAILY 90 tablet 1  . losartan-hydrochlorothiazide (HYZAAR) 100-12.5 MG tablet TAKE 1 TABLET BY MOUTH DAILY 90 tablet 0  . nebivolol (BYSTOLIC) 2.5 MG tablet TAKE 1  TABLET(2.5 MG) BY MOUTH DAILY 90 tablet 0  . potassium chloride SA (KLOR-CON) 20 MEQ tablet TAKE 1 TABLET(20 MEQ) BY MOUTH TWICE DAILY 180 tablet 0  . pravastatin (PRAVACHOL) 20 MG tablet Take 1 tablet (20 mg total) by mouth daily. 90 tablet 3  . Cholecalciferol 50 MCG (2000 UT) TABS Take 1 tablet (2,000 Units total) by mouth daily. 90 tablet 1   No facility-administered medications prior to visit.    ROS Review of Systems  Constitutional: Negative.  Negative for diaphoresis and fatigue.  HENT: Negative.   Eyes: Negative for visual disturbance.  Respiratory: Negative for cough, chest tightness, shortness of breath and wheezing.   Cardiovascular: Negative for chest pain, palpitations and leg swelling.  Gastrointestinal: Negative for abdominal pain, constipation, diarrhea, nausea and vomiting.  Endocrine: Negative.  Negative for cold intolerance and heat intolerance.  Genitourinary: Negative for difficulty urinating.  Musculoskeletal: Positive for neck pain. Negative for arthralgias and back pain.  Skin: Negative.  Negative for color change.  Neurological: Negative.  Negative for dizziness, syncope, weakness and light-headedness.  Hematological: Negative for adenopathy. Does not bruise/bleed easily.  Psychiatric/Behavioral: Negative.     Objective:  BP 130/70 (BP Location: Left Arm, Patient Position: Sitting, Cuff Size: Large)   Pulse (!) 54   Temp 98.1 F (36.7 C) (Oral)   Resp 16   Ht 6' (1.829 m)   Wt 181 lb 6 oz (  82.3 kg)   SpO2 97%   BMI 24.60 kg/m   BP Readings from Last 3 Encounters:  01/09/20 130/70  09/04/19 136/78  07/12/19 118/67    Wt Readings from Last 3 Encounters:  01/09/20 181 lb 6 oz (82.3 kg)  09/04/19 186 lb 7 oz (84.6 kg)  07/12/19 185 lb (83.9 kg)    Physical Exam Vitals reviewed.  Constitutional:      Appearance: Normal appearance.  HENT:     Nose: Nose normal.     Mouth/Throat:     Mouth: Mucous membranes are moist.  Eyes:     General: No  scleral icterus.    Conjunctiva/sclera: Conjunctivae normal.  Neck:     Thyroid: No thyroid mass, thyromegaly or thyroid tenderness.   Cardiovascular:     Rate and Rhythm: Regular rhythm. Bradycardia present.     Heart sounds: No murmur.  Pulmonary:     Effort: Pulmonary effort is normal.     Breath sounds: No stridor. No wheezing, rhonchi or rales.  Abdominal:     General: Abdomen is flat.     Palpations: There is no mass.     Tenderness: There is no abdominal tenderness. There is no guarding.  Musculoskeletal:        General: Normal range of motion.     Cervical back: Normal range of motion and neck supple. No pain with movement. Normal range of motion.     Right lower leg: No edema.     Left lower leg: No edema.  Lymphadenopathy:     Cervical: No cervical adenopathy.     Right cervical: No superficial, deep or posterior cervical adenopathy.    Left cervical: No superficial, deep or posterior cervical adenopathy.  Skin:    General: Skin is warm and dry.  Neurological:     General: No focal deficit present.     Mental Status: He is alert and oriented to person, place, and time. Mental status is at baseline.     Lab Results  Component Value Date   WBC 7.3 05/19/2019   HGB 16.2 05/19/2019   HCT 47.3 05/19/2019   PLT 239.0 05/19/2019   GLUCOSE 114 (H) 01/09/2020   CHOL 161 09/04/2019   TRIG 174.0 (H) 09/04/2019   HDL 39.50 09/04/2019   LDLDIRECT 95.0 10/22/2016   LDLCALC 87 09/04/2019   ALT 14 05/19/2019   AST 16 05/19/2019   NA 138 01/09/2020   K 3.8 01/09/2020   CL 105 01/09/2020   CREATININE 0.87 01/09/2020   BUN 22 01/09/2020   CO2 26 01/09/2020   TSH 1.66 01/09/2020   PSA 2.68 03/07/2019   HGBA1C 5.8 09/04/2019    VAS [place] LOWER EXTREMITY VENOUS REFLUX  Result Date: 07/13/2019  Lower Venous Reflux Study Indications: Swelling, Edema, Pain, and redness. Other Indications: Patent wears compression stocking and noticed bilateral rash,                    left  worse than right. Performing Technologist: Delorise Shiner RVT  Examination Guidelines: A complete evaluation includes B-mode imaging, spectral Doppler, color Doppler, and power Doppler as needed of all accessible portions of each vessel. Bilateral testing is considered an integral part of a complete examination. Limited examinations for reoccurring indications may be performed as noted. The reflux portion of the exam is performed with the patient in reverse Trendelenburg.  Right Technical Findings: No evidence of DVT, SVT, or Baker's cyst. The sapheno-femoral junction is competent. No evidence of  GSV reflux. No evidence of SSV reflux. No evidence of deep venous reflux.  Left Technical Findings: No evidence of DVT, SVT, or Baker's cyst. The sapheno-femoral junction is competent. No evidence of GSV reflux. No evidence of SSV reflux. The left common femoral vein demonstrates deep venous reflux.  Venous Reflux Times Normal value < 0.5 sec +--------------+------+---------+--------+--------+ RIGHT         RefluxReflux NoDiameterComments                Yes                            +--------------+------+---------+--------+--------+ CFV                    no                     +--------------+------+---------+--------+--------+ FV prox                no                     +--------------+------+---------+--------+--------+ Popliteal              no                     +--------------+------+---------+--------+--------+ GSV at [facility]             no     0.726           +--------------+------+---------+--------+--------+ GSV prox thigh         no     0.498           +--------------+------+---------+--------+--------+ GSV mid thigh          no     0.461           +--------------+------+---------+--------+--------+ GSV dist thigh         no     0.342           +--------------+------+---------+--------+--------+ GSV at knee            no     0.388            +--------------+------+---------+--------+--------+ GSV prox calf                 0.393           +--------------+------+---------+--------+--------+ GSV mid calf                  0.397           +--------------+------+---------+--------+--------+ SSV Pop Fossa                 0.260           +--------------+------+---------+--------+--------+ SSV prox calf          no     0.420           +--------------+------+---------+--------+--------+ SSV mid calf           no     0.356           +--------------+------+---------+--------+--------+  +--------------+------+---------+--------+--------+ LEFT          RefluxReflux NoDiameterComments                Yes                            +--------------+------+---------+--------+--------+ CFV  yes                            +--------------+------+---------+--------+--------+ FV prox                no                     +--------------+------+---------+--------+--------+ Popliteal              no                     +--------------+------+---------+--------+--------+ GSV at [facility]             no     0.739           +--------------+------+---------+--------+--------+ GSV prox thigh         no     0.534           +--------------+------+---------+--------+--------+ GSV mid thigh          no     0.603           +--------------+------+---------+--------+--------+ GSV dist thigh         no     0.438           +--------------+------+---------+--------+--------+ GSV at knee            no     0.475           +--------------+------+---------+--------+--------+ GSV prox calf                 0.429           +--------------+------+---------+--------+--------+ GSV mid calf                  0.489           +--------------+------+---------+--------+--------+ SSV Pop Fossa                 0.256           +--------------+------+---------+--------+--------+ SSV prox calf           no     0.219           +--------------+------+---------+--------+--------+ SSV mid calf           no     0.246           +--------------+------+---------+--------+--------+   Summary: Right: There is no evidence of deep vein thrombosis in the lower extremity. There is no evidence of superficial venous thrombosis. No cystic structure found in the popliteal fossa. Left: Evidence of chronic venous insufficiency is detected in the deep venous system. There is no evidence of deep vein thrombosis in the lower extremity. There is no evidence of superficial venous thrombosis. No cystic structure found in the popliteal fossa.  *See table(s) above for measurements and observations. Electronically signed by Deitra Mayo MD on 07/13/2019 at 10:30:24 AM.    Final    No results found.  Assessment & Plan:   Leor was seen today for hypertension and hypothyroidism.  Diagnoses and all orders for this visit:  Essential hypertension- His blood pressure is well controlled.  Electrolytes and renal function are normal. -     Basic metabolic panel; Future -     Basic metabolic panel  Acquired hypothyroidism- His TSH is in the normal range.  He will remain on the current dose of levothyroxine. -     TSH; Future -     TSH  Neck pain on  right side- Plain films show degenerative changes.  He wants to see a specialist about this. -     DG Cervical Spine Complete; Future  Collar bone pain- Plain films are unremarkable.  I offered him reassurance. -     DG Clavicle Right; Future  Degenerative disc disease, cervical -     Ambulatory referral to Sports Medicine  Paroxysmal atrial fibrillation [facility])- He has good rate and rhythm control.  He has a mild sinus bradycardia.  He is tolerating this well with no symptoms.  No intervention needed at this time.   I am having Darion L. Volanda Napoleon "Collie Siad" maintain his Fish Oil, pravastatin, aspirin, Cholecalciferol, flecainide, levothyroxine, diazepam,  losartan-hydrochlorothiazide, potassium chloride SA, nebivolol, and amLODipine.  No orders of the defined types were placed in this encounter.  I spent 50 minutes in preparing to see the patient by review of recent labs, imaging and procedures, obtaining and reviewing separately obtained history, communicating with the patient and family or caregiver, ordering medications, tests or procedures, and documenting clinical information in the EHR including the differential Dx, treatment, and any further evaluation and other management of 1. Essential hypertension 2. Acquired hypothyroidism 3. Neck pain on right side 4. Collar bone pain 5. Degenerative disc disease, cervical 6. Paroxysmal atrial fibrillation ([place])     Follow-up: Return in about 3 months (around 04/10/2020).  Scarlette Calico, MD

## 2020-01-09 NOTE — Patient Instructions (Signed)
Hypothyroidism  Hypothyroidism is when the thyroid gland does not make enough of certain hormones (it is underactive). The thyroid gland is a small gland located in the lower front part of the neck, just in front of the windpipe (trachea). This gland makes hormones that help control how the body uses food for energy (metabolism) as well as how the heart and brain function. These hormones also play a role in keeping your bones strong. When the thyroid is underactive, it produces too little of the hormones thyroxine (T4) and triiodothyronine (T3). What are the causes? This condition may be caused by:  Hashimoto's disease. This is a disease in which the body's disease-fighting system (immune system) attacks the thyroid gland. This is the most common cause.  Viral infections.  Pregnancy.  Certain medicines.  Birth defects.  Past radiation treatments to the head or neck for cancer.  Past treatment with radioactive iodine.  Past exposure to radiation in the environment.  Past surgical removal of part or all of the thyroid.  Problems with a gland in the center of the brain (pituitary gland).  Lack of enough iodine in the diet. What increases the risk? You are more likely to develop this condition if:  You are male.  You have a family history of thyroid conditions.  You use a medicine called lithium.  You take medicines that affect the immune system (immunosuppressants). What are the signs or symptoms? Symptoms of this condition include:  Feeling as though you have no energy (lethargy).  Not being able to tolerate cold.  Weight gain that is not explained by a change in diet or exercise habits.  Lack of appetite.  Dry skin.  Coarse hair.  Menstrual irregularity.  Slowing of thought processes.  Constipation.  Sadness or depression. How is this diagnosed? This condition may be diagnosed based on:  Your symptoms, your medical history, and a physical exam.  Blood  tests. You may also have imaging tests, such as an ultrasound or MRI. How is this treated? This condition is treated with medicine that replaces the thyroid hormones that your body does not make. After you begin treatment, it may take several weeks for symptoms to go away. Follow these instructions at home:  Take over-the-counter and prescription medicines only as told by your health care provider.  If you start taking any new medicines, tell your health care provider.  Keep all follow-up visits as told by your health care provider. This is important. ? As your condition improves, your dosage of thyroid hormone medicine may change. ? You will need to have blood tests regularly so that your health care provider can monitor your condition. Contact a health care provider if:  Your symptoms do not get better with treatment.  You are taking thyroid replacement medicine and you: ? Sweat a lot. ? Have tremors. ? Feel anxious. ? Lose weight rapidly. ? Cannot tolerate heat. ? Have emotional swings. ? Have diarrhea. ? Feel weak. Get help right away if you have:  Chest pain.  An irregular heartbeat.  A rapid heartbeat.  Difficulty breathing. Summary  Hypothyroidism is when the thyroid gland does not make enough of certain hormones (it is underactive).  When the thyroid is underactive, it produces too little of the hormones thyroxine (T4) and triiodothyronine (T3).  The most common cause is Hashimoto's disease, a disease in which the body's disease-fighting system (immune system) attacks the thyroid gland. The condition can also be caused by viral infections, medicine, pregnancy, or past   radiation treatment to the head or neck.  Symptoms may include weight gain, dry skin, constipation, feeling as though you do not have energy, and not being able to tolerate cold.  This condition is treated with medicine to replace the thyroid hormones that your body does not make. This information  is not intended to replace advice given to you by your health care provider. Make sure you discuss any questions you have with your health care provider. Document Revised: 08/06/2017 Document Reviewed: 08/04/2017 Elsevier Patient Education  2020 [employer] Inc.  

## 2020-01-10 DIAGNOSIS — M5030 Other cervical disc degeneration, unspecified cervical region: Secondary | ICD-10-CM | POA: Insufficient documentation

## 2020-01-16 ENCOUNTER — Encounter: Payer: Self-pay | Admitting: Family Medicine

## 2020-01-16 ENCOUNTER — Other Ambulatory Visit: Payer: Self-pay

## 2020-01-16 ENCOUNTER — Ambulatory Visit: Payer: PPO | Admitting: Family Medicine

## 2020-01-16 VITALS — BP 122/72 | HR 50 | Ht 72.0 in | Wt 183.0 lb

## 2020-01-16 DIAGNOSIS — M25419 Effusion, unspecified shoulder: Secondary | ICD-10-CM | POA: Insufficient documentation

## 2020-01-16 DIAGNOSIS — M62838 Other muscle spasm: Secondary | ICD-10-CM | POA: Diagnosis not present

## 2020-01-16 NOTE — Patient Instructions (Addendum)
Thank you for coming in today. Attend PT Use heat and a TENS unit.  Use over the counter voltaren gel on the swollen joint on the chest up to 4x daily as needed for pain.  Diclofenac gel is the same thing.   TENS UNIT: This is helpful for muscle pain and spasm.   Search and Purchase a TENS 7000 2nd edition at  www.tenspros.com or www.[employer].com It should be less than $30.     TENS unit instructions: Do not shower or bathe with the unit on Turn the unit off before removing electrodes or batteries If the electrodes lose stickiness add a drop of water to the electrodes after they are disconnected from the unit and place on plastic sheet. If you continued to have difficulty, call the TENS unit company to purchase more electrodes. Do not apply lotion on the skin area prior to use. Make sure the skin is clean and dry as this will help prolong the life of the electrodes. After use, always check skin for unusual red areas, rash or other skin difficulties. If there are any skin problems, does not apply electrodes to the same area. Never remove the electrodes from the unit by pulling the wires. Do not use the TENS unit or electrodes other than as directed. Do not change electrode placement without consultating your therapist or physician. Keep 2 fingers with between each electrode. Wear time ratio is 2:1, on to off times.    For example on for 30 minutes off for 15 minutes and then on for 30 minutes off for 15 minutes   Recheck in 4-6 weeks. Especially if not better

## 2020-01-16 NOTE — Progress Notes (Signed)
Subjective:    I'm seeing this patient as a consultation for:  Dr. [name]. Note will be routed back to referring provider/PCP.  CC: Neck pain  I, Judy Pimple, am serving as a scribe for Dr. Lynne Leader.  HPI: Pt is a 74 y/o male presenting w/ c/o neck and R-sided clavicular pain and swelling at the [facility] joint.  He rates his pain 5-6/10 as and describes his pain as coming and going and aching in nature. Patient states when he turns his head he can hear cracking/popping.  He notes pain is been ongoing for about a month and slowly worsening.  He denies any radiating pain weakness or numbness.  Pain worse with activity.  He exercises regularly and is worried that the pain will start interfering with his ability to exercise.  Radiating pain: stays in R sided neck and shoulder area  UE numbness/tingling: no  UE weakness: no  Aggravating factors: turning head all the way to the left  Treatments tried: heat   Diagnostic imaging: C-spine and clavicular XR- 01/09/20  Past medical history, Surgical history, Family history, Social history, Allergies, and medications have been entered into the medical record, reviewed.   Review of Systems: No new headache, visual changes, nausea, vomiting, diarrhea, constipation, dizziness, abdominal pain, skin rash, fevers, chills, night sweats, weight loss, swollen lymph nodes, body aches, joint swelling, muscle aches, chest pain, shortness of breath, mood changes, visual or auditory hallucinations.   Objective:    Vitals:   01/16/20 0840  BP: 122/72  Pulse: (!) 50  SpO2: 96%   General: Well Developed, well nourished, and in no acute distress.  Neuro/Psych: Alert and oriented x3, extra-ocular muscles intact, able to move all 4 extremities, sensation grossly intact. Skin: Warm and dry, no rashes noted.  Respiratory: Not using accessory muscles, speaking in full sentences, trachea midline.  Cardiovascular: Pulses palpable, no extremity edema. Abdomen: Does  not appear distended. MSK:  C-spine normal-appearing Nontender midline. Tender palpation right cervical paraspinal musculature and trapezius. Slight decreased cervical range of motion to extension and to left rotation.  Lateral flexion limited mildly bilaterally. Extremity motion sensation and strength reflexes are intact and equal bilaterally. Right [place] joint slightly visibly swollen compared to left.  Minimally tender to palpation.  Normal clavicle and shoulder motion.  Lab and Radiology Results DG Cervical Spine Complete  Result Date: 01/09/2020 CLINICAL DATA:  Pain EXAM: CERVICAL SPINE - COMPLETE 4+ VIEW COMPARISON:  None. FINDINGS: There is no prevertebral soft tissue swelling. There is no acute displaced fracture or acute appearing malalignment. There are advanced degenerative changes of the cervical spine, greatest at the C4 through C7 levels. Multilevel osseous neural foraminal narrowing is noted throughout the cervical spine, greatest at the C5-C6 level. IMPRESSION: 1. No acute displaced fracture or acute appearing malalignment. 2. Advanced multilevel degenerative changes of the cervical spine. Electronically Signed   By: Constance Holster M.D.   On: 01/09/2020 16:21   DG Clavicle Right  Result Date: 01/09/2020 CLINICAL DATA:  Pain EXAM: RIGHT CLAVICLE - 2+ VIEWS COMPARISON:  None. FINDINGS: There is no evidence of fracture or other focal bone lesions. Soft tissues are unremarkable. IMPRESSION: Negative. Electronically Signed   By: Constance Holster M.D.   On: 01/09/2020 16:20   I, Lynne Leader, personally (independently) visualized and performed the interpretation of the images attached in this note.   Impression and Recommendations:    Assessment and Plan: 74 y.o. male with  C-spine pain.  Pain due to paraspinal  muscle dysfunction and spasm.  Patient does have considerable DDD and facet DJD on recent x-ray however his pain is more located over musculature and only ongoing for about a  month.  Plan for physical therapy heating pad and TENS unit.  The patient and I both think that muscle relaxers are unlikely to be very helpful and not worth the side effects.  Okay to use Tylenol for pain.  Recheck 4 to 6 weeks.  Right [place] joint swelling.  Not very painful.  X-ray clavicle did not visualize the [place] joints very well which is typical for plain films.  However given that he does not really hurt here I think it is okay to proceed with a bit of watchful waiting.  Reasonable to try over-the-counter Voltaren gel on this.  If worsening proceed with ultrasound and potentially advanced imaging.  Discussed possibility of SI joint injection however both the patient and I agreed that that is deafly not worth the risks at this point.  Recheck 4 to 6 weeks.   Orders Placed This Encounter  Procedures  . Ambulatory referral to Physical Therapy    Referral Priority:   Routine    Referral Type:   Physical Medicine    Referral Reason:   Specialty Services Required    Requested Specialty:   Physical Therapy   No orders of the defined types were placed in this encounter.   Discussed warning signs or symptoms. Please see discharge instructions. Patient expresses understanding.   The above documentation has been reviewed and is accurate and complete Lynne Leader

## 2020-02-19 ENCOUNTER — Other Ambulatory Visit: Payer: Self-pay

## 2020-02-19 ENCOUNTER — Encounter: Payer: Self-pay | Admitting: Internal Medicine

## 2020-02-19 ENCOUNTER — Telehealth: Payer: Self-pay | Admitting: Internal Medicine

## 2020-02-19 ENCOUNTER — Ambulatory Visit (INDEPENDENT_AMBULATORY_CARE_PROVIDER_SITE_OTHER): Payer: PPO | Admitting: Internal Medicine

## 2020-02-19 VITALS — BP 138/58 | HR 59 | Temp 98.1°F | Resp 16 | Ht 72.0 in | Wt 182.1 lb

## 2020-02-19 DIAGNOSIS — I480 Paroxysmal atrial fibrillation: Secondary | ICD-10-CM

## 2020-02-19 DIAGNOSIS — I10 Essential (primary) hypertension: Secondary | ICD-10-CM

## 2020-02-19 DIAGNOSIS — R7303 Prediabetes: Secondary | ICD-10-CM

## 2020-02-19 DIAGNOSIS — H9313 Tinnitus, bilateral: Secondary | ICD-10-CM | POA: Insufficient documentation

## 2020-02-19 LAB — BASIC METABOLIC PANEL
BUN: 18 mg/dL (ref 6–23)
CO2: 30 mEq/L (ref 19–32)
Calcium: 9.6 mg/dL (ref 8.4–10.5)
Chloride: 103 mEq/L (ref 96–112)
Creatinine, Ser: 0.88 mg/dL (ref 0.40–1.50)
GFR: 84.48 mL/min (ref >60.00)
Glucose, Bld: 105 mg/dL — ABNORMAL HIGH (ref 70–99)
Potassium: 3.6 mEq/L (ref 3.5–5.1)
Sodium: 139 mEq/L (ref 135–145)

## 2020-02-19 LAB — CBC WITH DIFFERENTIAL/PLATELET
Basophils Absolute: 0.1 10*3/uL (ref 0.0–0.1)
Basophils Relative: 0.7 % (ref 0.0–3.0)
Eosinophils Absolute: 0.2 10*3/uL (ref 0.0–0.7)
Eosinophils Relative: 2.4 % (ref 0.0–5.0)
HCT: 51.4 % (ref 39.0–52.0)
Hemoglobin: 17.7 g/dL — ABNORMAL HIGH (ref 13.0–17.0)
Lymphocytes Relative: 21.3 % (ref 12.0–46.0)
Lymphs Abs: 1.8 10*3/uL (ref 0.7–4.0)
MCHC: 34.5 g/dL (ref 30.0–36.0)
MCV: 91.6 fl (ref 78.0–100.0)
Monocytes Absolute: 1 10*3/uL (ref 0.1–1.0)
Monocytes Relative: 11.4 % (ref 3.0–12.0)
Neutro Abs: 5.5 10*3/uL (ref 1.4–7.7)
Neutrophils Relative %: 64.2 % (ref 43.0–77.0)
Platelets: 249 10*3/uL (ref 150.0–400.0)
RBC: 5.61 Mil/uL (ref 4.22–5.81)
RDW: 13.1 % (ref 11.5–15.5)
WBC: 8.6 10*3/uL (ref 4.0–10.5)

## 2020-02-19 LAB — HEMOGLOBIN A1C: Hgb A1c MFr Bld: 6.2 % (ref 4.6–6.5)

## 2020-02-19 NOTE — Telephone Encounter (Signed)
New Message:   Pt's wife is calling and states the pt was feeling lightheaded and also had some blurred vision last night. She states he was chilled too. She states she took his temp and it was fine. She states his pulse was slightly elevated. Please advise pt has an appt today at 1:20 today.

## 2020-02-19 NOTE — Patient Instructions (Signed)

## 2020-02-19 NOTE — Progress Notes (Signed)
Subjective:  Patient ID: Kevin Bradley, male    DOB: September 15, 1945  Age: 74 y.o. MRN: [id number]  CC: Hypertension  This visit occurred during the SARS-CoV-2 public health emergency.  Safety protocols were in place, including screening questions prior to the visit, additional usage of staff PPE, and extensive cleaning of exam room while observing appropriate contact time as indicated for disinfecting solutions.    HPI [name] presents for concerns about a spell that he had 1 day prior to this visit.  He was sitting at rest and felt the acute onset of chills, feeling shaky, feeling anxious, short of breath and his heart rate was elevated but regular.  He felt weak and dizzy but did not pass out.  He did not experience lightheadedness.  He did not experience chest pain, shortness of breath, diaphoresis, edema, cough, abdominal pain, nausea, vomiting, diarrhea, fever, dysuria, or hematuria.  The whole thing lasted about an hour and then spontaneously resolved.  He has felt well since then.  Outpatient Medications Prior to Visit  Medication Sig Dispense Refill  . amLODipine (NORVASC) 10 MG tablet Take 1 tablet (10 mg total) by mouth daily. 90 tablet 3  . aspirin 325 MG tablet Take 81 tablets by mouth daily.     . Fish Oil OIL Take 1 tablet by mouth daily.      . flecainide (TAMBOCOR) 50 MG tablet Take 2 tablets (100 mg total) by mouth daily. 180 tablet 2  . ibuprofen (ADVIL) 200 MG tablet Take by mouth.    . levothyroxine (SYNTHROID) 75 MCG tablet TAKE 1 TABLET(75 MCG) BY MOUTH DAILY 90 tablet 1  . losartan-hydrochlorothiazide (HYZAAR) 100-12.5 MG tablet TAKE 1 TABLET BY MOUTH DAILY 90 tablet 0  . nebivolol (BYSTOLIC) 2.5 MG tablet TAKE 1 TABLET(2.5 MG) BY MOUTH DAILY 90 tablet 0  . potassium chloride SA (KLOR-CON) 20 MEQ tablet TAKE 1 TABLET(20 MEQ) BY MOUTH TWICE DAILY 180 tablet 0  . pravastatin (PRAVACHOL) 20 MG tablet Take 1 tablet (20 mg total) by mouth daily. 90 tablet 3   No  facility-administered medications prior to visit.    ROS Review of Systems  Constitutional: Positive for chills. Negative for appetite change, diaphoresis, fatigue and fever.  HENT: Positive for tinnitus. Negative for facial swelling, rhinorrhea, sinus pressure, sore throat and trouble swallowing.   Eyes: Negative.   Respiratory: Negative for cough, chest tightness, shortness of breath and wheezing.   Cardiovascular: Positive for palpitations. Negative for chest pain and leg swelling.  Gastrointestinal: Negative for abdominal pain, constipation, diarrhea, nausea and vomiting.  Endocrine: Negative.   Genitourinary: Negative.  Negative for difficulty urinating.  Musculoskeletal: Negative.  Negative for arthralgias and neck pain.  Skin: Negative.   Neurological: Positive for dizziness and weakness. Negative for seizures, syncope, light-headedness, numbness and headaches.  Hematological: Negative for adenopathy. Does not bruise/bleed easily.  Psychiatric/Behavioral: The patient is nervous/anxious.     Objective:  BP (!) 138/58 (BP Location: Left Arm, Patient Position: Sitting, Cuff Size: Normal)   Pulse (!) 59   Temp 98.1 F (36.7 C) (Oral)   Resp 16   Ht 6' (1.829 m)   Wt 182 lb 2 oz (82.6 kg)   SpO2 97%   BMI 24.70 kg/m   BP Readings from Last 3 Encounters:  02/19/20 (!) 138/58  01/16/20 122/72  01/09/20 130/70    Wt Readings from Last 3 Encounters:  02/19/20 182 lb 2 oz (82.6 kg)  01/16/20 183 lb (83 kg)  01/09/20 181 lb 6 oz (82.3 kg)    Physical Exam Vitals reviewed.  Constitutional:      Appearance: Normal appearance.  HENT:     Nose: Nose normal.     Mouth/Throat:     Mouth: Mucous membranes are moist.  Eyes:     General: No scleral icterus.    Conjunctiva/sclera: Conjunctivae normal.  Cardiovascular:     Rate and Rhythm: Regular rhythm. Bradycardia present.     Pulses: Normal pulses.     Heart sounds: No murmur heard.  No gallop.      Comments: EKG  - Sinus bradycardia, 49 bpm Otherwise normal EKG Pulmonary:     Effort: Pulmonary effort is normal.     Breath sounds: No stridor. No wheezing, rhonchi or rales.  Abdominal:     General: Abdomen is flat.     Palpations: There is no mass.     Tenderness: There is no abdominal tenderness. There is no guarding.  Musculoskeletal:        General: No swelling.     Cervical back: Neck supple.     Right lower leg: No edema.     Left lower leg: No edema.  Skin:    General: Skin is warm and dry.  Neurological:     General: No focal deficit present.     Mental Status: He is alert and oriented to person, place, and time. Mental status is at baseline.  Psychiatric:        Mood and Affect: Mood normal.        Behavior: Behavior normal.        Thought Content: Thought content normal.        Judgment: Judgment normal.     Lab Results  Component Value Date   WBC 8.6 02/19/2020   HGB 17.7 (H) 02/19/2020   HCT 51.4 02/19/2020   PLT 249.0 02/19/2020   GLUCOSE 105 (H) 02/19/2020   CHOL 161 09/04/2019   TRIG 174.0 (H) 09/04/2019   HDL 39.50 09/04/2019   LDLDIRECT 95.0 10/22/2016   LDLCALC 87 09/04/2019   ALT 14 05/19/2019   AST 16 05/19/2019   NA 139 02/19/2020   K 3.6 02/19/2020   CL 103 02/19/2020   CREATININE 0.88 02/19/2020   BUN 18 02/19/2020   CO2 30 02/19/2020   TSH 1.66 01/09/2020   PSA 2.68 03/07/2019   HGBA1C 6.2 02/19/2020    VAS [place] LOWER EXTREMITY VENOUS REFLUX  Result Date: 07/13/2019  Lower Venous Reflux Study Indications: Swelling, Edema, Pain, and redness. Other Indications: Patent wears compression stocking and noticed bilateral rash,                    left worse than right. Performing Technologist: Delorise Shiner RVT  Examination Guidelines: A complete evaluation includes B-mode imaging, spectral Doppler, color Doppler, and power Doppler as needed of all accessible portions of each vessel. Bilateral testing is considered an integral part of a complete examination.  Limited examinations for reoccurring indications may be performed as noted. The reflux portion of the exam is performed with the patient in reverse Trendelenburg.  Right Technical Findings: No evidence of DVT, SVT, or Baker's cyst. The sapheno-femoral junction is competent. No evidence of GSV reflux. No evidence of SSV reflux. No evidence of deep venous reflux.  Left Technical Findings: No evidence of DVT, SVT, or Baker's cyst. The sapheno-femoral junction is competent. No evidence of GSV reflux. No evidence of SSV reflux. The left common femoral vein  demonstrates deep venous reflux.  Venous Reflux Times Normal value < 0.5 sec +--------------+------+---------+--------+--------+ RIGHT         RefluxReflux NoDiameterComments                Yes                            +--------------+------+---------+--------+--------+ CFV                    no                     +--------------+------+---------+--------+--------+ FV prox                no                     +--------------+------+---------+--------+--------+ Popliteal              no                     +--------------+------+---------+--------+--------+ GSV at [facility]             no     0.726           +--------------+------+---------+--------+--------+ GSV prox thigh         no     0.498           +--------------+------+---------+--------+--------+ GSV mid thigh          no     0.461           +--------------+------+---------+--------+--------+ GSV dist thigh         no     0.342           +--------------+------+---------+--------+--------+ GSV at knee            no     0.388           +--------------+------+---------+--------+--------+ GSV prox calf                 0.393           +--------------+------+---------+--------+--------+ GSV mid calf                  0.397           +--------------+------+---------+--------+--------+ SSV Pop Fossa                 0.260            +--------------+------+---------+--------+--------+ SSV prox calf          no     0.420           +--------------+------+---------+--------+--------+ SSV mid calf           no     0.356           +--------------+------+---------+--------+--------+  +--------------+------+---------+--------+--------+ LEFT          RefluxReflux NoDiameterComments                Yes                            +--------------+------+---------+--------+--------+ CFV            yes                            +--------------+------+---------+--------+--------+ FV prox  no                     +--------------+------+---------+--------+--------+ Popliteal              no                     +--------------+------+---------+--------+--------+ GSV at [facility]             no     0.739           +--------------+------+---------+--------+--------+ GSV prox thigh         no     0.534           +--------------+------+---------+--------+--------+ GSV mid thigh          no     0.603           +--------------+------+---------+--------+--------+ GSV dist thigh         no     0.438           +--------------+------+---------+--------+--------+ GSV at knee            no     0.475           +--------------+------+---------+--------+--------+ GSV prox calf                 0.429           +--------------+------+---------+--------+--------+ GSV mid calf                  0.489           +--------------+------+---------+--------+--------+ SSV Pop Fossa                 0.256           +--------------+------+---------+--------+--------+ SSV prox calf          no     0.219           +--------------+------+---------+--------+--------+ SSV mid calf           no     0.246           +--------------+------+---------+--------+--------+   Summary: Right: There is no evidence of deep vein thrombosis in the lower extremity. There is no evidence of superficial  venous thrombosis. No cystic structure found in the popliteal fossa. Left: Evidence of chronic venous insufficiency is detected in the deep venous system. There is no evidence of deep vein thrombosis in the lower extremity. There is no evidence of superficial venous thrombosis. No cystic structure found in the popliteal fossa.  *See table(s) above for measurements and observations. Electronically signed by Deitra Mayo MD on 07/13/2019 at 10:30:24 AM.    Final     Assessment & Plan:   Shashank was seen today for hypertension.  Diagnoses and all orders for this visit:  Tinnitus aurium, bilateral -     Ambulatory referral to ENT  Essential hypertension- His blood pressure is adequately well controlled.  Electrolytes and renal function are normal. -     EKG 12-Lead -     CBC with Differential/Platelet; Future -     CBC with Differential/Platelet  Paroxysmal atrial fibrillation (HCC)- It sounds like his spell was an episode of A. fib with rapid ventricular response.  His symptoms have resolved.  Will continue the current dose of flecainide.  He is not willing to be anticoagulated.  Prediabetes-his A1c is at 6.2%.  Medical therapy is not indicated. -     Basic metabolic panel; Future -  Hemoglobin A1c; Future -     Hemoglobin A1c -     Basic metabolic panel   I have discontinued Ahmadou L. Volanda Napoleon "Zebulan Zadrozny"'s pravastatin. I am also having him maintain his Fish Oil, aspirin, flecainide, levothyroxine, losartan-hydrochlorothiazide, potassium chloride SA, nebivolol, amLODipine, and ibuprofen.  No orders of the defined types were placed in this encounter.    Follow-up: Return in about 3 months (around 05/21/2020).  Scarlette Calico, MD

## 2020-02-22 ENCOUNTER — Other Ambulatory Visit: Payer: Self-pay | Admitting: Internal Medicine

## 2020-02-22 DIAGNOSIS — I10 Essential (primary) hypertension: Secondary | ICD-10-CM

## 2020-02-23 ENCOUNTER — Encounter: Payer: Self-pay | Admitting: Internal Medicine

## 2020-03-04 DIAGNOSIS — L821 Other seborrheic keratosis: Secondary | ICD-10-CM | POA: Diagnosis not present

## 2020-03-04 DIAGNOSIS — L570 Actinic keratosis: Secondary | ICD-10-CM | POA: Diagnosis not present

## 2020-03-04 DIAGNOSIS — L820 Inflamed seborrheic keratosis: Secondary | ICD-10-CM | POA: Diagnosis not present

## 2020-03-04 DIAGNOSIS — D225 Melanocytic nevi of trunk: Secondary | ICD-10-CM | POA: Diagnosis not present

## 2020-03-04 DIAGNOSIS — Z85828 Personal history of other malignant neoplasm of skin: Secondary | ICD-10-CM | POA: Diagnosis not present

## 2020-03-04 DIAGNOSIS — L905 Scar conditions and fibrosis of skin: Secondary | ICD-10-CM | POA: Diagnosis not present

## 2020-03-04 DIAGNOSIS — Z872 Personal history of diseases of the skin and subcutaneous tissue: Secondary | ICD-10-CM | POA: Diagnosis not present

## 2020-04-05 ENCOUNTER — Other Ambulatory Visit: Payer: Self-pay | Admitting: Internal Medicine

## 2020-04-05 DIAGNOSIS — E039 Hypothyroidism, unspecified: Secondary | ICD-10-CM

## 2020-04-22 DIAGNOSIS — H903 Sensorineural hearing loss, bilateral: Secondary | ICD-10-CM | POA: Diagnosis not present

## 2020-04-22 DIAGNOSIS — H9313 Tinnitus, bilateral: Secondary | ICD-10-CM | POA: Diagnosis not present

## 2020-05-23 ENCOUNTER — Other Ambulatory Visit: Payer: Self-pay | Admitting: Internal Medicine

## 2020-05-23 DIAGNOSIS — I10 Essential (primary) hypertension: Secondary | ICD-10-CM

## 2020-06-04 ENCOUNTER — Ambulatory Visit: Payer: PPO | Admitting: Cardiology

## 2020-06-12 NOTE — Progress Notes (Signed)
HPI: FU atrial fibrillation. A previous cardioNet monitor revealed sinus rhythm with occasional bursts of atrial fibrillation. He had an episode of chest pain approximately 9 years ago and had a cardiac catheterization that was normal by his report perfomed in [place]. Patient placed on flecanide previously. A followup stress echocardiogram showed no ischemia and normal LV function. There was a question of septal hypokinesis both at rest and with stress. There was no exercise induced arrhythmias. Cardionet in Aug 2012 showed sinus with PAT but no VT. Previously declined anticoagulants. Abdominal ultrasound in May of 2014 showed no aneurysm.  Echocardiogram September 2020 showed normal LV function, mild left ventricular hypertrophy, mild left atrial enlargement.  Since I last saw himhe denies dyspnea, chest pain or syncope.  He is having bouts of palpitations approximately once every 2 weeks where he feels his heart "skip".    Current Outpatient Medications  Medication Sig Dispense Refill  . amLODipine (NORVASC) 10 MG tablet Take 1 tablet (10 mg total) by mouth daily. 90 tablet 3  . aspirin 325 MG tablet Take 81 tablets by mouth daily.     . Fish Oil OIL Take 1 tablet by mouth daily.      . flecainide (TAMBOCOR) 50 MG tablet Take 2 tablets (100 mg total) by mouth daily. 180 tablet 2  . levothyroxine (SYNTHROID) 75 MCG tablet TAKE 1 TABLET(75 MCG) BY MOUTH DAILY 90 tablet 1  . losartan-hydrochlorothiazide (HYZAAR) 100-12.5 MG tablet TAKE 1 TABLET BY MOUTH DAILY 90 tablet 0  . nebivolol (BYSTOLIC) 2.5 MG tablet TAKE 1 TABLET(2.5 MG) BY MOUTH DAILY 90 tablet 0  . potassium chloride SA (KLOR-CON) 20 MEQ tablet TAKE 1 TABLET(20 MEQ) BY MOUTH TWICE DAILY 180 tablet 0  . VITAMIN D, CHOLECALCIFEROL, PO Take by mouth daily.     No current facility-administered medications for this visit.     Past Medical History:  Diagnosis Date  . Anxiety   . Atrial fibrillation ([place])   . BPH (benign  prostatic hypertrophy)   . External thrombosed hemorrhoids   . History of colonoscopy   . HTN (hypertension)   . Hx of colonic polyps   . Hypothyroidism   . Low HDL (under 40)   . Nephrolithiasis     Past Surgical History:  Procedure Laterality Date  . APPENDECTOMY    . cyst tongue  1990   benign  . INGUINAL HERNIA REPAIR Right 1990  . INGUINAL HERNIA REPAIR Left 1948  . THYROIDECTOMY, PARTIAL  1990  . TONSILLECTOMY  1948    Social History   Socioeconomic History  . Marital status: Married    Spouse name: Not on file  . Number of children: Not on file  . Years of education: Not on file  . Highest education level: Not on file  Occupational History  . Occupation: Truck Geophysicist/field seismologist  Tobacco Use  . Smoking status: Former Smoker    Packs/day: 2.00    Years: 10.00    Pack years: 20.00    Types: Cigars, Cigarettes    Quit date: 2005    Years since quitting: 16.7  . Smokeless tobacco: Never Used  . Tobacco comment: quit smoking cigars in 2017--04/15/18  Vaping Use  . Vaping Use: Never used  Substance and Sexual Activity  . Alcohol use: No    Alcohol/week: 0.0 standard drinks  . Drug use: No  . Sexual activity: Yes  Other Topics Concern  . Not on file  Social History Narrative  .  Not on file   Social Determinants of Health   Financial Resource Strain:   . Difficulty of Paying Living Expenses: Not on file  Food Insecurity:   . Worried About Charity fundraiser in the Last Year: Not on file  . Ran Out of Food in the Last Year: Not on file  Transportation Needs:   . Lack of Transportation (Medical): Not on file  . Lack of Transportation (Non-Medical): Not on file  Physical Activity:   . Days of Exercise per Week: Not on file  . Minutes of Exercise per Session: Not on file  Stress:   . Feeling of Stress : Not on file  Social Connections:   . Frequency of Communication with Friends and Family: Not on file  . Frequency of Social Gatherings with Friends and Family: Not  on file  . Attends Religious Services: Not on file  . Active Member of Clubs or Organizations: Not on file  . Attends Archivist Meetings: Not on file  . Marital Status: Not on file  Intimate Partner Violence:   . Fear of Current or Ex-Partner: Not on file  . Emotionally Abused: Not on file  . Physically Abused: Not on file  . Sexually Abused: Not on file    Family History  Problem Relation Age of Onset  . Heart attack Father   . AAA (abdominal aortic aneurysm) Father 16  . Alcohol abuse Other   . Breast cancer Other   . Cancer Neg Hx   . Early death Neg Hx   . Diabetes Neg Hx   . Heart disease Neg Hx   . Hyperlipidemia Neg Hx   . Hypertension Neg Hx   . Kidney disease Neg Hx   . Stroke Neg Hx   . Colon cancer Neg Hx     ROS: no fevers or chills, productive cough, hemoptysis, dysphasia, odynophagia, melena, hematochezia, dysuria, hematuria, rash, seizure activity, orthopnea, PND, pedal edema, claudication. Remaining systems are negative.  Physical Exam: Well-developed well-nourished in no acute distress.  Skin is warm and dry.  HEENT is normal.  Neck is supple.  Chest is clear to auscultation with normal expansion.  Cardiovascular exam is regular rate and rhythm.  Abdominal exam nontender or distended. No masses palpated. Extremities show no edema. neuro grossly intact  ECG-February 19, 2020-sinus bradycardia with heart rate of 48 and first-degree AV block.  Personally reviewed  A/P  1 paroxysmal atrial fibrillation-patient remains in sinus rhythm.  We will continue flecainide and Bystolic.  Continue aspirin 81 mg daily.  Patient continues to decline anticoagulation and understands the higher risk of CVA.  2 hypertension-patient's blood pressure is controlled.  Continue present medical regimen.  3 hyperlipidemia-continue statin.  4 palpitations-etiology unclear.  Symptoms not clearly similar to atrial fibrillation.  Question PVCs.  We discussed an apple watch  to potentially record a rhythm strip and he will look into this.  Kirk Ruths, MD

## 2020-06-21 ENCOUNTER — Encounter: Payer: Self-pay | Admitting: Cardiology

## 2020-06-21 ENCOUNTER — Ambulatory Visit: Payer: PPO | Admitting: Cardiology

## 2020-06-21 ENCOUNTER — Other Ambulatory Visit: Payer: Self-pay

## 2020-06-21 VITALS — BP 132/72 | HR 62 | Ht 72.0 in | Wt 189.4 lb

## 2020-06-21 DIAGNOSIS — I10 Essential (primary) hypertension: Secondary | ICD-10-CM

## 2020-06-21 DIAGNOSIS — Z23 Encounter for immunization: Secondary | ICD-10-CM

## 2020-06-21 DIAGNOSIS — R002 Palpitations: Secondary | ICD-10-CM | POA: Diagnosis not present

## 2020-06-21 DIAGNOSIS — I480 Paroxysmal atrial fibrillation: Secondary | ICD-10-CM | POA: Diagnosis not present

## 2020-06-21 MED ORDER — FLECAINIDE ACETATE 50 MG PO TABS: 100.0000 mg | ORAL_TABLET | Freq: Every day | ORAL | 2 refills | Status: DC

## 2020-06-21 MED ORDER — NEBIVOLOL HCL 2.5 MG PO TABS: ORAL_TABLET | ORAL | 3 refills | Status: DC

## 2020-06-21 NOTE — Patient Instructions (Signed)

## 2020-06-28 ENCOUNTER — Other Ambulatory Visit: Payer: Self-pay | Admitting: Cardiology

## 2020-07-04 DIAGNOSIS — H903 Sensorineural hearing loss, bilateral: Secondary | ICD-10-CM | POA: Diagnosis not present

## 2020-07-19 DIAGNOSIS — H43813 Vitreous degeneration, bilateral: Secondary | ICD-10-CM | POA: Diagnosis not present

## 2020-07-19 DIAGNOSIS — H35033 Hypertensive retinopathy, bilateral: Secondary | ICD-10-CM | POA: Diagnosis not present

## 2020-07-19 DIAGNOSIS — Z961 Presence of intraocular lens: Secondary | ICD-10-CM | POA: Diagnosis not present

## 2020-07-19 DIAGNOSIS — H40053 Ocular hypertension, bilateral: Secondary | ICD-10-CM | POA: Diagnosis not present

## 2020-07-19 DIAGNOSIS — H524 Presbyopia: Secondary | ICD-10-CM | POA: Diagnosis not present

## 2020-07-19 DIAGNOSIS — H52223 Regular astigmatism, bilateral: Secondary | ICD-10-CM | POA: Diagnosis not present

## 2020-07-29 MED ORDER — NEBIVOLOL HCL 2.5 MG PO TABS: ORAL_TABLET | ORAL | 3 refills | Status: DC

## 2020-07-30 ENCOUNTER — Other Ambulatory Visit: Payer: Self-pay | Admitting: Cardiology

## 2020-07-30 MED ORDER — NEBIVOLOL HCL 2.5 MG PO TABS: ORAL_TABLET | ORAL | 3 refills | Status: DC

## 2020-08-14 ENCOUNTER — Other Ambulatory Visit: Payer: Self-pay | Admitting: Internal Medicine

## 2020-08-14 DIAGNOSIS — I10 Essential (primary) hypertension: Secondary | ICD-10-CM

## 2020-09-27 ENCOUNTER — Telehealth: Payer: Self-pay | Admitting: General Practice

## 2020-09-27 ENCOUNTER — Ambulatory Visit (INDEPENDENT_AMBULATORY_CARE_PROVIDER_SITE_OTHER): Payer: PPO | Admitting: Internal Medicine

## 2020-09-27 ENCOUNTER — Other Ambulatory Visit: Payer: Self-pay

## 2020-09-27 ENCOUNTER — Encounter: Payer: Self-pay | Admitting: Internal Medicine

## 2020-09-27 ENCOUNTER — Other Ambulatory Visit: Payer: Self-pay | Admitting: Internal Medicine

## 2020-09-27 VITALS — BP 132/76 | HR 82 | Temp 97.9°F | Resp 16 | Ht 72.0 in | Wt 189.2 lb

## 2020-09-27 DIAGNOSIS — Z0001 Encounter for general adult medical examination with abnormal findings: Secondary | ICD-10-CM | POA: Diagnosis not present

## 2020-09-27 DIAGNOSIS — E785 Hyperlipidemia, unspecified: Secondary | ICD-10-CM

## 2020-09-27 DIAGNOSIS — D751 Secondary polycythemia: Secondary | ICD-10-CM | POA: Diagnosis not present

## 2020-09-27 DIAGNOSIS — E039 Hypothyroidism, unspecified: Secondary | ICD-10-CM

## 2020-09-27 DIAGNOSIS — I10 Essential (primary) hypertension: Secondary | ICD-10-CM | POA: Diagnosis not present

## 2020-09-27 DIAGNOSIS — N400 Enlarged prostate without lower urinary tract symptoms: Secondary | ICD-10-CM

## 2020-09-27 DIAGNOSIS — Z23 Encounter for immunization: Secondary | ICD-10-CM

## 2020-09-27 DIAGNOSIS — R7303 Prediabetes: Secondary | ICD-10-CM | POA: Diagnosis not present

## 2020-09-27 DIAGNOSIS — E781 Pure hyperglyceridemia: Secondary | ICD-10-CM | POA: Diagnosis not present

## 2020-09-27 LAB — TSH: TSH: 3.73 u[IU]/mL (ref 0.35–4.50)

## 2020-09-27 LAB — HEPATIC FUNCTION PANEL
ALT: 14 U/L (ref 0–53)
AST: 16 U/L (ref 0–37)
Albumin: 4.5 g/dL (ref 3.5–5.2)
Alkaline Phosphatase: 81 U/L (ref 39–117)
Bilirubin, Direct: 0.2 mg/dL (ref 0.0–0.3)
Total Bilirubin: 1.4 mg/dL — ABNORMAL HIGH (ref 0.2–1.2)
Total Protein: 7.3 g/dL (ref 6.0–8.3)

## 2020-09-27 LAB — LIPID PANEL
Cholesterol: 162 mg/dL (ref 0–200)
HDL: 38.3 mg/dL — ABNORMAL LOW (ref >39.00)
NonHDL: 124.08
Total CHOL/HDL Ratio: 4
Triglycerides: 364 mg/dL — ABNORMAL HIGH (ref 0.0–149.0)
VLDL: 72.8 mg/dL — ABNORMAL HIGH (ref 0.0–40.0)

## 2020-09-27 LAB — BASIC METABOLIC PANEL
BUN: 17 mg/dL (ref 6–23)
CO2: 31 mEq/L (ref 19–32)
Calcium: 10 mg/dL (ref 8.4–10.5)
Chloride: 99 mEq/L (ref 96–112)
Creatinine, Ser: 0.9 mg/dL (ref 0.40–1.50)
GFR: 83.64 mL/min (ref >60.00)
Glucose, Bld: 85 mg/dL (ref 70–99)
Potassium: 4.2 mEq/L (ref 3.5–5.1)
Sodium: 137 mEq/L (ref 135–145)

## 2020-09-27 LAB — CBC WITH DIFFERENTIAL/PLATELET
Basophils Absolute: 0.1 10*3/uL (ref 0.0–0.1)
Basophils Relative: 0.8 % (ref 0.0–3.0)
Eosinophils Absolute: 0.2 10*3/uL (ref 0.0–0.7)
Eosinophils Relative: 2.3 % (ref 0.0–5.0)
HCT: 53.5 % — ABNORMAL HIGH (ref 39.0–52.0)
Hemoglobin: 18.5 g/dL (ref 13.0–17.0)
Lymphocytes Relative: 23.7 % (ref 12.0–46.0)
Lymphs Abs: 1.7 10*3/uL (ref 0.7–4.0)
MCHC: 34.5 g/dL (ref 30.0–36.0)
MCV: 90.3 fl (ref 78.0–100.0)
Monocytes Absolute: 0.9 10*3/uL (ref 0.1–1.0)
Monocytes Relative: 12.5 % — ABNORMAL HIGH (ref 3.0–12.0)
Neutro Abs: 4.3 10*3/uL (ref 1.4–7.7)
Neutrophils Relative %: 60.7 % (ref 43.0–77.0)
Platelets: 241 10*3/uL (ref 150.0–400.0)
RBC: 5.92 Mil/uL — ABNORMAL HIGH (ref 4.22–5.81)
RDW: 12.8 % (ref 11.5–15.5)
WBC: 7 10*3/uL (ref 4.0–10.5)

## 2020-09-27 LAB — URINALYSIS, ROUTINE W REFLEX MICROSCOPIC
Bilirubin Urine: NEGATIVE
Hgb urine dipstick: NEGATIVE
Ketones, ur: NEGATIVE
Leukocytes,Ua: NEGATIVE
Nitrite: NEGATIVE
RBC / HPF: NONE SEEN (ref 0–?)
Specific Gravity, Urine: 1.025 (ref 1.000–1.030)
Total Protein, Urine: NEGATIVE
Urine Glucose: NEGATIVE
Urobilinogen, UA: 0.2 (ref 0.0–1.0)
pH: 5.5 (ref 5.0–8.0)

## 2020-09-27 LAB — HEMOGLOBIN A1C: Hgb A1c MFr Bld: 5.8 % (ref 4.6–6.5)

## 2020-09-27 LAB — LDL CHOLESTEROL, DIRECT: Direct LDL: 93 mg/dL

## 2020-09-27 LAB — PSA: PSA: 2.62 ng/mL (ref 0.10–4.00)

## 2020-09-27 NOTE — Telephone Encounter (Signed)
Slightly higher than prior but has been high in the past and can wait for PCP to address on Monday.

## 2020-09-27 NOTE — Telephone Encounter (Signed)
Dr. Sharlet Salina,  The lab called with a critical lab result for this patient.  Hbg 18.5.   Thanks, Villa Herb, RN

## 2020-09-27 NOTE — Patient Instructions (Signed)

## 2020-09-27 NOTE — Progress Notes (Signed)
Subjective:  Patient ID: Kevin Bradley, male    DOB: [birth year]  Age: 75 y.o. MRN: [id number]  CC: Annual Exam, Hypertension, Hypothyroidism, and Hyperlipidemia  This visit occurred during the SARS-CoV-2 public health emergency.  Safety protocols were in place, including screening questions prior to the visit, additional usage of staff PPE, and extensive cleaning of exam room while observing appropriate contact time as indicated for disinfecting solutions.    HPI [name] presents for a CPX.  He is active and denies any recent episodes of CP, DOE, palpitations, edema, or fatigue.  He tells me his blood pressure has been well controlled.  Outpatient Medications Prior to Visit  Medication Sig Dispense Refill  . amLODipine (NORVASC) 10 MG tablet Take 1 tablet (10 mg total) by mouth daily. 90 tablet 3  . aspirin 325 MG tablet Take 81 tablets by mouth daily.     . Fish Oil OIL Take 1 tablet by mouth daily.    . flecainide (TAMBOCOR) 50 MG tablet TAKE 2 TABLETS(100 MG) BY MOUTH DAILY 180 tablet 4  . nebivolol (BYSTOLIC) 2.5 MG tablet TAKE 1 TABLET(2.5 MG) BY MOUTH DAILY 90 tablet 3  . potassium chloride SA (KLOR-CON) 20 MEQ tablet TAKE 1 TABLET(20 MEQ) BY MOUTH TWICE DAILY 180 tablet 0  . VITAMIN D, CHOLECALCIFEROL, PO Take by mouth daily.    Marland Kitchen levothyroxine (SYNTHROID) 75 MCG tablet TAKE 1 TABLET(75 MCG) BY MOUTH DAILY 90 tablet 1  . losartan-hydrochlorothiazide (HYZAAR) 100-12.5 MG tablet TAKE 1 TABLET BY MOUTH DAILY 90 tablet 0   No facility-administered medications prior to visit.    ROS Review of Systems  Constitutional: Negative for appetite change, diaphoresis, fatigue and unexpected weight change.  HENT: Negative.   Eyes: Negative.   Respiratory: Negative for cough, chest tightness, shortness of breath and wheezing.   Cardiovascular: Negative for chest pain, palpitations and leg swelling.  Gastrointestinal: Negative for abdominal pain, constipation, diarrhea, nausea  and vomiting.  Endocrine: Negative.  Negative for cold intolerance and heat intolerance.  Genitourinary: Negative.  Negative for difficulty urinating, scrotal swelling and testicular pain.  Musculoskeletal: Negative for arthralgias and myalgias.  Skin: Negative.  Negative for color change, pallor and rash.  Neurological: Negative.  Negative for dizziness, weakness, light-headedness and headaches.  Hematological: Negative for adenopathy. Does not bruise/bleed easily.  Psychiatric/Behavioral: Negative.     Objective:  BP 132/76 (BP Location: Left Arm, Patient Position: Sitting, Cuff Size: Normal)   Pulse 82   Temp 97.9 F (36.6 C) (Oral)   Resp 16   Ht 6' (1.829 m)   Wt 189 lb 4 oz (85.8 kg)   SpO2 97%   BMI 25.67 kg/m   BP Readings from Last 3 Encounters:  09/27/20 132/76  06/21/20 132/72  02/19/20 (!) 138/58    Wt Readings from Last 3 Encounters:  09/27/20 189 lb 4 oz (85.8 kg)  06/21/20 189 lb 6.4 oz (85.9 kg)  02/19/20 182 lb 2 oz (82.6 kg)    Physical Exam Vitals reviewed.  Constitutional:      Appearance: Normal appearance.  HENT:     Nose: Nose normal.     Mouth/Throat:     Mouth: Mucous membranes are moist.  Eyes:     General: No scleral icterus.    Conjunctiva/sclera: Conjunctivae normal.  Cardiovascular:     Rate and Rhythm: Normal rate.     Heart sounds: No murmur heard.   Pulmonary:     Effort: Pulmonary effort is normal.  Breath sounds: No stridor. No wheezing, rhonchi or rales.  Abdominal:     General: Abdomen is flat. Bowel sounds are normal. There is no distension.     Palpations: Abdomen is soft. There is no hepatomegaly, splenomegaly or mass.     Tenderness: There is no abdominal tenderness.  Musculoskeletal:        General: Normal range of motion.     Cervical back: Neck supple.     Right lower leg: No edema.     Left lower leg: No edema.  Lymphadenopathy:     Cervical: No cervical adenopathy.  Skin:    General: Skin is warm and  dry.     Coloration: Skin is not pale.  Neurological:     General: No focal deficit present.     Mental Status: He is alert.  Psychiatric:        Mood and Affect: Mood normal.        Behavior: Behavior normal.     Lab Results  Component Value Date   WBC 7.0 09/27/2020   HGB 18.5 Repeated and verified X2. (HH) 09/27/2020   HCT 53.5 (H) 09/27/2020   PLT 241.0 09/27/2020   GLUCOSE 85 09/27/2020   CHOL 162 09/27/2020   TRIG 364.0 (H) 09/27/2020   HDL 38.30 (L) 09/27/2020   LDLDIRECT 93.0 09/27/2020   LDLCALC 87 09/04/2019   ALT 14 09/27/2020   AST 16 09/27/2020   NA 137 09/27/2020   K 4.2 09/27/2020   CL 99 09/27/2020   CREATININE 0.90 09/27/2020   BUN 17 09/27/2020   CO2 31 09/27/2020   TSH 3.73 09/27/2020   PSA 2.62 09/27/2020   HGBA1C 5.8 09/27/2020    VAS [place] LOWER EXTREMITY VENOUS REFLUX  Result Date: 07/13/2019  Lower Venous Reflux Study Indications: Swelling, Edema, Pain, and redness. Other Indications: Patent wears compression stocking and noticed bilateral rash,                    left worse than right. Performing Technologist: Delorise Shiner RVT  Examination Guidelines: A complete evaluation includes B-mode imaging, spectral Doppler, color Doppler, and power Doppler as needed of all accessible portions of each vessel. Bilateral testing is considered an integral part of a complete examination. Limited examinations for reoccurring indications may be performed as noted. The reflux portion of the exam is performed with the patient in reverse Trendelenburg.  Right Technical Findings: No evidence of DVT, SVT, or Baker's cyst. The sapheno-femoral junction is competent. No evidence of GSV reflux. No evidence of SSV reflux. No evidence of deep venous reflux.  Left Technical Findings: No evidence of DVT, SVT, or Baker's cyst. The sapheno-femoral junction is competent. No evidence of GSV reflux. No evidence of SSV reflux. The left common femoral vein demonstrates deep venous reflux.   Venous Reflux Times Normal value < 0.5 sec +--------------+------+---------+--------+--------+ RIGHT         RefluxReflux NoDiameterComments                Yes                            +--------------+------+---------+--------+--------+ CFV                    no                     +--------------+------+---------+--------+--------+ FV prox  no                     +--------------+------+---------+--------+--------+ Popliteal              no                     +--------------+------+---------+--------+--------+ GSV at [facility]             no     0.726           +--------------+------+---------+--------+--------+ GSV prox thigh         no     0.498           +--------------+------+---------+--------+--------+ GSV mid thigh          no     0.461           +--------------+------+---------+--------+--------+ GSV dist thigh         no     0.342           +--------------+------+---------+--------+--------+ GSV at knee            no     0.388           +--------------+------+---------+--------+--------+ GSV prox calf                 0.393           +--------------+------+---------+--------+--------+ GSV mid calf                  0.397           +--------------+------+---------+--------+--------+ SSV Pop Fossa                 0.260           +--------------+------+---------+--------+--------+ SSV prox calf          no     0.420           +--------------+------+---------+--------+--------+ SSV mid calf           no     0.356           +--------------+------+---------+--------+--------+  +--------------+------+---------+--------+--------+ LEFT          RefluxReflux NoDiameterComments                Yes                            +--------------+------+---------+--------+--------+ CFV            yes                            +--------------+------+---------+--------+--------+ FV prox                no                      +--------------+------+---------+--------+--------+ Popliteal              no                     +--------------+------+---------+--------+--------+ GSV at [facility]             no     0.739           +--------------+------+---------+--------+--------+ GSV prox thigh         no     0.534           +--------------+------+---------+--------+--------+ GSV mid thigh  no     0.603           +--------------+------+---------+--------+--------+ GSV dist thigh         no     0.438           +--------------+------+---------+--------+--------+ GSV at knee            no     0.475           +--------------+------+---------+--------+--------+ GSV prox calf                 0.429           +--------------+------+---------+--------+--------+ GSV mid calf                  0.489           +--------------+------+---------+--------+--------+ SSV Pop Fossa                 0.256           +--------------+------+---------+--------+--------+ SSV prox calf          no     0.219           +--------------+------+---------+--------+--------+ SSV mid calf           no     0.246           +--------------+------+---------+--------+--------+   Summary: Right: There is no evidence of deep vein thrombosis in the lower extremity. There is no evidence of superficial venous thrombosis. No cystic structure found in the popliteal fossa. Left: Evidence of chronic venous insufficiency is detected in the deep venous system. There is no evidence of deep vein thrombosis in the lower extremity. There is no evidence of superficial venous thrombosis. No cystic structure found in the popliteal fossa.  *See table(s) above for measurements and observations. Electronically signed by Deitra Mayo MD on 07/13/2019 at 10:30:24 AM.    Final     Assessment & Plan:   Ramond was seen today for annual exam, hypertension, hypothyroidism and hyperlipidemia.  Diagnoses and all  orders for this visit:  Acquired hypothyroidism- His TSH is in the normal range.  He will stay on the current dose of levothyroxine. -     TSH; Future -     TSH -     levothyroxine (SYNTHROID) 75 MCG tablet; Take 1 tablet (75 mcg total) by mouth daily before breakfast.  Essential hypertension- His blood pressure is well controlled.  Electrolytes and renal function are normal. -     CBC with Differential/Platelet; Future -     Basic metabolic panel; Future -     TSH; Future -     TSH -     Basic metabolic panel -     CBC with Differential/Platelet -     losartan-hydrochlorothiazide (HYZAAR) 100-12.5 MG tablet; Take 1 tablet by mouth daily.  BPH without obstruction/lower urinary tract symptoms- His PSA is normal.  This is a reassuring sign that he doesn't have prostate cancer.  He has no symptoms that need to be treated. -     PSA; Future -     Urinalysis, Routine w reflex microscopic; Future -     Urinalysis, Routine w reflex microscopic -     PSA  Hyperlipidemia with target LDL less than 100- His ASCVD risk score is elevated at 30.2%.  I recommended that he take a statin for CV risk reduction. -     Lipid panel; Future -     Hepatic function panel; Future -  TSH; Future -     TSH -     Hepatic function panel -     Lipid panel  Hypertriglyceridemia- His triglycerides are mildly elevated but not high enough to be treated with a medication. -     Lipid panel; Future -     Lipid panel  Prediabetes- His A1c is at 5.8%.  Medical therapy is not indicated. -     Hemoglobin A1c; Future -     Hemoglobin A1c  Erythrocytosis- I recommended that he have this evaluated by hematology. -     Ambulatory referral to Hematology  Other orders -     LDL cholesterol, direct   I have changed [place] L. Volanda Napoleon "Madeline Hodge"'s levothyroxine. I am also having him start on Livalo and Boostrix. Additionally, I am having him maintain his Fish Oil, aspirin, amLODipine, potassium chloride SA,  (VITAMIN D, CHOLECALCIFEROL, PO), flecainide, nebivolol, and losartan-hydrochlorothiazide.  Meds ordered this encounter  Medications  . levothyroxine (SYNTHROID) 75 MCG tablet    Sig: Take 1 tablet (75 mcg total) by mouth daily before breakfast.    Dispense:  90 tablet    Refill:  1  . losartan-hydrochlorothiazide (HYZAAR) 100-12.5 MG tablet    Sig: Take 1 tablet by mouth daily.    Dispense:  90 tablet    Refill:  1  . Pitavastatin Calcium (LIVALO) 2 MG TABS    Sig: Take 1 tablet (2 mg total) by mouth daily.    Dispense:  90 tablet    Refill:  1  . Tdap (BOOSTRIX) 5-2.5-18.5 LF-MCG/0.5 injection    Sig: Inject 0.5 mLs into the muscle once for 1 dose.    Dispense:  0.5 mL    Refill:  0     Follow-up: Return in about 6 months (around 03/27/2021).  Scarlette Calico, MD

## 2020-09-28 DIAGNOSIS — D751 Secondary polycythemia: Secondary | ICD-10-CM | POA: Insufficient documentation

## 2020-09-28 DIAGNOSIS — Z0001 Encounter for general adult medical examination with abnormal findings: Secondary | ICD-10-CM | POA: Insufficient documentation

## 2020-09-28 DIAGNOSIS — Z23 Encounter for immunization: Secondary | ICD-10-CM | POA: Insufficient documentation

## 2020-09-28 MED ORDER — LEVOTHYROXINE SODIUM 75 MCG PO TABS
75.0000 ug | ORAL_TABLET | Freq: Every day | ORAL | 1 refills | Status: DC
Start: 2020-09-28 — End: 2021-04-11

## 2020-09-28 MED ORDER — LOSARTAN POTASSIUM-HCTZ 100-12.5 MG PO TABS: 1.0000 | ORAL_TABLET | Freq: Every day | ORAL | 1 refills | Status: DC

## 2020-09-28 MED ORDER — LIVALO 2 MG PO TABS: 1.0000 | ORAL_TABLET | Freq: Every day | ORAL | 1 refills | Status: DC

## 2020-09-28 MED ORDER — BOOSTRIX 5-2.5-18.5 LF-MCG/0.5 IM SUSP: 0.5000 mL | Freq: Once | INTRAMUSCULAR | 0 refills | Status: AC

## 2020-09-28 NOTE — Assessment & Plan Note (Signed)
Exam completed Labs reviewed Vaccines reviewed and updated Cancer screenings are up-to-date Patient education was given 

## 2020-09-30 ENCOUNTER — Telehealth: Payer: Self-pay | Admitting: Internal Medicine

## 2020-09-30 NOTE — Telephone Encounter (Signed)
Received a new hem referral from Dr. [name] for erythrocytosis. Kevin Bradley returned my call and has been scheduled to see Kevin Bradley on 2/3 at 11145am w/labs at 1115am. Pt aware to arrive 15 minutes early.

## 2020-10-09 ENCOUNTER — Other Ambulatory Visit: Payer: Self-pay | Admitting: Physician Assistant

## 2020-10-09 DIAGNOSIS — D751 Secondary polycythemia: Secondary | ICD-10-CM

## 2020-10-10 ENCOUNTER — Other Ambulatory Visit: Payer: Self-pay

## 2020-10-10 ENCOUNTER — Encounter: Payer: Self-pay | Admitting: Internal Medicine

## 2020-10-10 ENCOUNTER — Inpatient Hospital Stay: Payer: PPO | Attending: Internal Medicine | Admitting: Internal Medicine

## 2020-10-10 ENCOUNTER — Inpatient Hospital Stay: Payer: PPO

## 2020-10-10 DIAGNOSIS — D751 Secondary polycythemia: Secondary | ICD-10-CM

## 2020-10-10 DIAGNOSIS — Z880 Allergy status to penicillin: Secondary | ICD-10-CM | POA: Insufficient documentation

## 2020-10-10 DIAGNOSIS — E039 Hypothyroidism, unspecified: Secondary | ICD-10-CM | POA: Diagnosis not present

## 2020-10-10 DIAGNOSIS — I10 Essential (primary) hypertension: Secondary | ICD-10-CM | POA: Insufficient documentation

## 2020-10-10 DIAGNOSIS — Z87891 Personal history of nicotine dependence: Secondary | ICD-10-CM | POA: Diagnosis not present

## 2020-10-10 DIAGNOSIS — Z803 Family history of malignant neoplasm of breast: Secondary | ICD-10-CM | POA: Insufficient documentation

## 2020-10-10 DIAGNOSIS — Z8249 Family history of ischemic heart disease and other diseases of the circulatory system: Secondary | ICD-10-CM | POA: Diagnosis not present

## 2020-10-10 DIAGNOSIS — R0981 Nasal congestion: Secondary | ICD-10-CM | POA: Insufficient documentation

## 2020-10-10 DIAGNOSIS — Z79899 Other long term (current) drug therapy: Secondary | ICD-10-CM | POA: Insufficient documentation

## 2020-10-10 DIAGNOSIS — Z9049 Acquired absence of other specified parts of digestive tract: Secondary | ICD-10-CM | POA: Diagnosis not present

## 2020-10-10 DIAGNOSIS — N400 Enlarged prostate without lower urinary tract symptoms: Secondary | ICD-10-CM | POA: Insufficient documentation

## 2020-10-10 DIAGNOSIS — Z811 Family history of alcohol abuse and dependence: Secondary | ICD-10-CM | POA: Insufficient documentation

## 2020-10-10 DIAGNOSIS — I4891 Unspecified atrial fibrillation: Secondary | ICD-10-CM | POA: Diagnosis not present

## 2020-10-10 LAB — CBC WITH DIFFERENTIAL (CANCER CENTER ONLY)
Abs Immature Granulocytes: 0.02 10*3/uL (ref 0.00–0.07)
Basophils Absolute: 0 10*3/uL (ref 0.0–0.1)
Basophils Relative: 1 %
Eosinophils Absolute: 0.2 10*3/uL (ref 0.0–0.5)
Eosinophils Relative: 3 %
HCT: 49.7 % (ref 39.0–52.0)
Hemoglobin: 17.5 g/dL — ABNORMAL HIGH (ref 13.0–17.0)
Immature Granulocytes: 0 %
Lymphocytes Relative: 27 %
Lymphs Abs: 1.8 10*3/uL (ref 0.7–4.0)
MCH: 31 pg (ref 26.0–34.0)
MCHC: 35.2 g/dL (ref 30.0–36.0)
MCV: 88 fL (ref 80.0–100.0)
Monocytes Absolute: 0.7 10*3/uL (ref 0.1–1.0)
Monocytes Relative: 11 %
Neutro Abs: 3.7 10*3/uL (ref 1.7–7.7)
Neutrophils Relative %: 58 %
Platelet Count: 238 10*3/uL (ref 150–400)
RBC: 5.65 MIL/uL (ref 4.22–5.81)
RDW: 12.2 % (ref 11.5–15.5)
WBC Count: 6.4 10*3/uL (ref 4.0–10.5)
nRBC: 0 % (ref 0.0–0.2)

## 2020-10-10 LAB — CMP (CANCER CENTER ONLY)
ALT: 14 U/L (ref 0–44)
AST: 15 U/L (ref 15–41)
Albumin: 3.9 g/dL (ref 3.5–5.0)
Alkaline Phosphatase: 69 U/L (ref 38–126)
Anion gap: 6 (ref 5–15)
BUN: 21 mg/dL (ref 8–23)
CO2: 26 mmol/L (ref 22–32)
Calcium: 9.3 mg/dL (ref 8.9–10.3)
Chloride: 107 mmol/L (ref 98–111)
Creatinine: 0.86 mg/dL (ref 0.61–1.24)
GFR, Estimated: >60 mL/min (ref >60)
Glucose, Bld: 109 mg/dL — ABNORMAL HIGH (ref 70–99)
Potassium: 4 mmol/L (ref 3.5–5.1)
Sodium: 139 mmol/L (ref 135–145)
Total Bilirubin: 1.5 mg/dL — ABNORMAL HIGH (ref 0.3–1.2)
Total Protein: 6.9 g/dL (ref 6.5–8.1)

## 2020-10-10 LAB — IRON AND TIBC
Iron: 159 ug/dL (ref 42–163)
Saturation Ratios: 54 % (ref 20–55)
TIBC: 296 ug/dL (ref 202–409)
UIBC: 137 ug/dL (ref 117–376)

## 2020-10-10 LAB — FERRITIN: Ferritin: 162 ng/mL (ref 24–336)

## 2020-10-10 NOTE — Progress Notes (Signed)
[facility] Telephone:([phone]) [phone]   Fax:(336) [phone]  CONSULT NOTE  REFERRING PHYSICIAN: Dr. Scarlette Calico  REASON FOR CONSULTATION:  75 years old white male with polycythemia  HPI Kevin Bradley is a 75 y.o. male with past medical history significant for hypertension, benign prostatic hypertrophy, hypothyroidism, and anxiety, atrial fibrillation and colon polyps. The patient has remote history of smoking for around 10 years and quit 35 years ago. He was seen by his primary care physician Dr. [name] for routine annual examination and blood work and CBC showed elevated hemoglobin of 14.5 and hematocrit 53.5%. The patient had normal white blood count as well as platelets count. He had similar results in the last few years with increased hemoglobin and hematocrit. He was referred to me today for evaluation and recommendation regarding his condition. He is not currently on any hormonal or other androgen medications. He eats red meat but only once a week. He does not take any iron supplements or any other nutritional supplements except for vitamin D and fish oil in addition to potassium. The patient has no family member with blood disease. On review of system he has no chest pain, shortness of breath, cough or hemoptysis. He denied having any fever or chills. He has no nausea, vomiting, diarrhea or constipation. He has no headache or visual changes but he has some pruritus after hot showers. He also has some nasal and sinus congestion. Family history significant for father died from ruptured AAA and mother had breast cancer at age 27. The patient is married and has 1 son. He currently works part-time at [employer]. He has a remote history of smoking for around 10 years but quit 35 years ago. He denied having any history of alcohol or drug abuse. HPI  Past Medical History:  Diagnosis Date  . Anxiety   . Atrial fibrillation ([place])   . BPH (benign  prostatic hypertrophy)   . External thrombosed hemorrhoids   . History of colonoscopy   . HTN (hypertension)   . Hx of colonic polyps   . Hypothyroidism   . Low HDL (under 40)   . Nephrolithiasis     Past Surgical History:  Procedure Laterality Date  . APPENDECTOMY    . cyst tongue  1990   benign  . INGUINAL HERNIA REPAIR Right 1990  . INGUINAL HERNIA REPAIR Left 1948  . THYROIDECTOMY, PARTIAL  1990  . TONSILLECTOMY  1948    Family History  Problem Relation Age of Onset  . Heart attack Father   . AAA (abdominal aortic aneurysm) Father 4  . Alcohol abuse Other   . Breast cancer Other   . Cancer Neg Hx   . Early death Neg Hx   . Diabetes Neg Hx   . Heart disease Neg Hx   . Hyperlipidemia Neg Hx   . Hypertension Neg Hx   . Kidney disease Neg Hx   . Stroke Neg Hx   . Colon cancer Neg Hx     Social History Social History   Tobacco Use  . Smoking status: Former Smoker    Packs/day: 2.00    Years: 10.00    Pack years: 20.00    Types: Cigars, Cigarettes    Quit date: 2005    Years since quitting: 17.1  . Smokeless tobacco: Never Used  . Tobacco comment: quit smoking cigars in 2017--04/15/18  Vaping Use  . Vaping Use: Never used  Substance Use Topics  .  Alcohol use: No    Alcohol/week: 0.0 standard drinks  . Drug use: No    Allergies  Allergen Reactions  . Penicillins     REACTION: passed out    Current Outpatient Medications  Medication Sig Dispense Refill  . amLODipine (NORVASC) 10 MG tablet Take 1 tablet (10 mg total) by mouth daily. 90 tablet 3  . aspirin 325 MG tablet Take 81 tablets by mouth daily.     . Fish Oil OIL Take 1 tablet by mouth daily.    . flecainide (TAMBOCOR) 50 MG tablet TAKE 2 TABLETS(100 MG) BY MOUTH DAILY 180 tablet 4  . levothyroxine (SYNTHROID) 75 MCG tablet Take 1 tablet (75 mcg total) by mouth daily before breakfast. 90 tablet 1  . losartan-hydrochlorothiazide (HYZAAR) 100-12.5 MG tablet Take 1 tablet by mouth daily. 90 tablet  1  . nebivolol (BYSTOLIC) 2.5 MG tablet TAKE 1 TABLET(2.5 MG) BY MOUTH DAILY 90 tablet 3  . Pitavastatin Calcium (LIVALO) 2 MG TABS Take 1 tablet (2 mg total) by mouth daily. 90 tablet 1  . potassium chloride SA (KLOR-CON) 20 MEQ tablet TAKE 1 TABLET(20 MEQ) BY MOUTH TWICE DAILY 180 tablet 0  . VITAMIN D, CHOLECALCIFEROL, PO Take by mouth daily.     No current facility-administered medications for this visit.    Review of Systems  Constitutional: negative Eyes: negative Ears, nose, mouth, throat, and face: positive for nasal congestion Respiratory: negative Cardiovascular: negative Gastrointestinal: negative Genitourinary:negative Integument/breast: negative Hematologic/lymphatic: negative Musculoskeletal:negative Neurological: negative Behavioral/Psych: negative Endocrine: negative Allergic/Immunologic: negative  Physical Exam  [id number], healthy, no distress, well nourished and well developed SKIN: skin color, texture, turgor are normal, no rashes or significant lesions HEAD: Normocephalic, No masses, lesions, tenderness or abnormalities EYES: normal, PERRLA, Conjunctiva are pink and non-injected EARS: External ears normal, Canals clear OROPHARYNX:no exudate, no erythema and lips, buccal mucosa, and tongue normal  NECK: supple, no adenopathy, no JVD LYMPH:  no palpable lymphadenopathy, no hepatosplenomegaly LUNGS: clear to auscultation , and palpation HEART: regular rate & rhythm, no murmurs and no gallops ABDOMEN:abdomen soft, non-tender, normal bowel sounds and no masses or organomegaly BACK: No CVA tenderness, Range of motion is normal EXTREMITIES:no joint deformities, effusion, or inflammation, no edema  NEURO: alert & oriented x 3 with fluent speech, no focal motor/sensory deficits  PERFORMANCE STATUS: ECOG 1  LABORATORY DATA: Lab Results  Component Value Date   WBC 6.4 10/10/2020   HGB 17.5 (H) 10/10/2020   HCT 49.7 10/10/2020   MCV 88.0 10/10/2020   PLT  238 10/10/2020      Chemistry      Component Value Date/Time   NA 139 10/10/2020 1119   K 4.0 10/10/2020 1119   CL 107 10/10/2020 1119   CO2 26 10/10/2020 1119   BUN 21 10/10/2020 1119   CREATININE 0.86 10/10/2020 1119      Component Value Date/Time   CALCIUM 9.3 10/10/2020 1119   ALKPHOS 69 10/10/2020 1119   AST 15 10/10/2020 1119   ALT 14 10/10/2020 1119   BILITOT 1.5 (H) 10/10/2020 1119       RADIOGRAPHIC STUDIES: No results found.  ASSESSMENT: This is a very pleasant 75 years old white male presented for evaluation of polycythemia suspicious for polycythemia vera but reactive polycythemia could not be completely excluded at this point.   PLAN: I had a lengthy discussion with the patient today about his current condition and further investigation to confirm the underlying etiology of his condition. I order several studies today  including repeat CBC which showed hemoglobin of 17.5 and hematocrit of 49.7%. The patient has normal leukocyte and platelet count. The iron study and ferritin are within the normal range. I also order JAK2 mutation panel but the results are not available yet. I recommended for the patient to continue on observation for now unless the JAK2 mutation is positive, we will call the patient for an earlier appointment and we will consider him for treatment with phlebotomy or hydroxyurea if needed. If the JAK2 mutation is negative, the patient will continue on observation by his primary care physician and we will see him on as-needed basis. He was advised to continue with the daily aspirin 81 mg p.o. The patient was advised to call immediately if he has any other concerning symptoms in the interval. The patient voices understanding of current disease status and treatment options and is in agreement with the current care plan.  All questions were answered. The patient knows to call the clinic with any problems, questions or concerns. We can certainly see the  patient much sooner if necessary.  Thank you so much for allowing me to participate in the care of Kevin Bradley. I will continue to follow up the patient with you and assist in his care.  The total time spent in the appointment was 60 minutes.  Disclaimer: This note was dictated with voice recognition software. Similar sounding words can inadvertently be transcribed and may not be corrected upon review.   Eilleen Kempf October 10, 2020, 12:08 PM

## 2020-10-16 ENCOUNTER — Telehealth: Payer: Self-pay

## 2020-10-16 NOTE — Telephone Encounter (Signed)
Returned call to pt about lab results. Pt asking if JAK2 results were back. Informed pt results still pending. Pt to be contacted once results are back. Pt verbalized understanding.

## 2020-10-17 ENCOUNTER — Telehealth: Payer: Self-pay | Admitting: Physician Assistant

## 2020-10-17 LAB — JAK2 (INCLUDING V617F AND EXON 12), MPL,& CALR-NEXT GEN SEQ

## 2020-10-17 NOTE — Telephone Encounter (Signed)
I called the patient to let him know that the JAK2 mutation testing was normal. Per Dr. Worthy Flank last note, no treatment recommended if it is normal and the patient may follow up with his PCP. The patient had several questions if he should take a blood thinner for his afib. It sounds like his cardiologist recommended it in the past. I directed him back to his cardiologist regarding that question. For now, recommend he continue on his 81 mg aspirin as prescribed.

## 2020-11-12 ENCOUNTER — Other Ambulatory Visit: Payer: Self-pay | Admitting: Internal Medicine

## 2020-11-13 ENCOUNTER — Other Ambulatory Visit: Payer: Self-pay | Admitting: Internal Medicine

## 2020-11-13 DIAGNOSIS — I10 Essential (primary) hypertension: Secondary | ICD-10-CM

## 2020-12-11 ENCOUNTER — Telehealth: Payer: Self-pay | Admitting: Internal Medicine

## 2020-12-11 NOTE — Progress Notes (Signed)
  Chronic Care Management   Outreach Note  12/11/2020 Name: Kevin Bradley MRN: [id number] DOB: 08/17/1946  Referred by: Janith Lima, MD Reason for referral : No chief complaint on file.   An unsuccessful telephone outreach was attempted today. The patient was referred to the pharmacist for assistance with care management and care coordination.   Follow Up Plan:   Carley Perdue [employer] Scheduler

## 2020-12-11 NOTE — Progress Notes (Signed)
  Chronic Care Management   Note  12/11/2020 Name: DEONTAYE CIVELLO MRN: [id number] DOB: May 09, 1946  RAFFERTY POSTLEWAIT is a 75 y.o. year old male who is a primary care patient of Janith Lima, MD. I reached out to Vernelle Emerald by phone today in response to a referral sent by Mr. Silvano Bilis PCP, Janith Lima, MD.   Mr. Cockrell was given information about Chronic Care Management services today including:  1. CCM service includes personalized support from designated clinical staff supervised by his physician, including individualized plan of care and coordination with other care providers 2. 24/7 contact phone numbers for assistance for urgent and routine care needs. 3. Service will only be billed when office clinical staff spend 20 minutes or more in a month to coordinate care. 4. Only one practitioner may furnish and bill the service in a calendar month. 5. The patient may stop CCM services at any time (effective at the end of the month) by phone call to the office staff.   Patient agreed to services and verbal consent obtained.   Follow up plan:   Carley Perdue [employer] Scheduler

## 2020-12-15 ENCOUNTER — Other Ambulatory Visit: Payer: Self-pay | Admitting: Cardiology

## 2020-12-15 DIAGNOSIS — I480 Paroxysmal atrial fibrillation: Secondary | ICD-10-CM

## 2021-01-23 ENCOUNTER — Telehealth: Payer: Self-pay | Admitting: Pharmacist

## 2021-01-23 NOTE — Progress Notes (Signed)
    Chronic Care Management Pharmacy Assistant   Name: Kevin Bradley  MRN: [id number] DOB: Apr 08, 1946  Reason for Encounter: Initial Questions Appointment: Telephone 01/27/21 @ 9 am    Recent office visits:  09/27/20 Kevin Bradley (PCP) - Annual Exam. Start Pitavastatin Calcium 2 mg & Synthroid 75 mcg. Tdap given. Hematology referral. F/u 6 mos.  Recent consult visits:  10/10/20 Kevin Bradley (Oncology) - Polycythemia.  Hospital visits:  None in previous 6 months  Medications: Outpatient Encounter Medications as of 01/23/2021  Medication Sig  . amLODipine (NORVASC) 10 MG tablet TAKE 1 TABLET(10 MG) BY MOUTH DAILY  . aspirin 325 MG tablet Take 81 tablets by mouth daily.   . Fish Oil OIL Take 1 tablet by mouth daily.  . flecainide (TAMBOCOR) 50 MG tablet TAKE 2 TABLETS(100 MG) BY MOUTH DAILY  . levothyroxine (SYNTHROID) 75 MCG tablet Take 1 tablet (75 mcg total) by mouth daily before breakfast.  . losartan-hydrochlorothiazide (HYZAAR) 100-12.5 MG tablet TAKE 1 TABLET BY MOUTH DAILY  . nebivolol (BYSTOLIC) 2.5 MG tablet TAKE 1 TABLET(2.5 MG) BY MOUTH DAILY  . Pitavastatin Calcium (LIVALO) 2 MG TABS Take 1 tablet (2 mg total) by mouth daily.  . potassium chloride SA (KLOR-CON) 20 MEQ tablet TAKE 1 TABLET(20 MEQ) BY MOUTH TWICE DAILY  . VITAMIN D, CHOLECALCIFEROL, PO Take by mouth daily.   No facility-administered encounter medications on file as of 01/23/2021.    Have you seen any other providers since your last visit? 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  Any changes in your medications or health?  Patient states no changes at this time.  Any side effects from any medications? Patient states not at this time.  Do you have an symptoms or problems not managed by your medications?  Patient states not at this time.  Any concerns about your health right now? Patient states no concerns at this time.  Has your provider asked that you check blood pressure, blood sugar,  or follow special diet at home?  Patient states he doesn't check his BP, glucose or follow a diet plan.  Do you get any type of exercise on a regular basis?  Patient states he walks and lift weights.  Can you think of a goal you would like to reach for your health?  Patient states not at this time.  Do you have any problems getting your medications?  Patient states no problems getting meds.  Is there anything that you would like to discuss during the appointment?  Patient states no concerns at this time.  Please bring medications and supplements to appointment  Star Rating Drugs: Pitavastatin - last fill 09/27/20 90D Losartan-HCTZ - last fill 11/12/20 Kevin Bradley, RMA Clinical Pharmacists Assistant [phone]  Time Spent: [phone]

## 2021-01-27 ENCOUNTER — Ambulatory Visit (INDEPENDENT_AMBULATORY_CARE_PROVIDER_SITE_OTHER): Payer: PPO | Admitting: Pharmacist

## 2021-01-27 ENCOUNTER — Other Ambulatory Visit: Payer: Self-pay

## 2021-01-27 DIAGNOSIS — E039 Hypothyroidism, unspecified: Secondary | ICD-10-CM | POA: Diagnosis not present

## 2021-01-27 DIAGNOSIS — I10 Essential (primary) hypertension: Secondary | ICD-10-CM | POA: Diagnosis not present

## 2021-01-27 DIAGNOSIS — I480 Paroxysmal atrial fibrillation: Secondary | ICD-10-CM | POA: Diagnosis not present

## 2021-01-27 DIAGNOSIS — E785 Hyperlipidemia, unspecified: Secondary | ICD-10-CM

## 2021-01-27 NOTE — Progress Notes (Signed)
Chronic Care Management Pharmacy Note  01/27/2021 Name:  Kevin Bradley MRN:  [id number] DOB:  Mar 13, 1946  Subjective: Kevin Bradley is an 75 y.o. year old male who is a primary patient of Janith Lima, MD.  The CCM team was consulted for assistance with disease management and care coordination needs.    Engaged with patient by telephone for initial visit in response to provider referral for pharmacy case management and/or care coordination services.   Consent to Services:  The patient was given the following information about Chronic Care Management services today, agreed to services, and gave verbal consent: 1. CCM service includes personalized support from designated clinical staff supervised by the primary care provider, including individualized plan of care and coordination with other care providers 2. 24/7 contact phone numbers for assistance for urgent and routine care needs. 3. Service will only be billed when office clinical staff spend 20 minutes or more in a month to coordinate care. 4. Only one practitioner may furnish and bill the service in a calendar month. 5.The patient may stop CCM services at any time (effective at the end of the month) by phone call to the office staff. 6. The patient will be responsible for cost sharing (co-pay) of up to 20% of the service fee (after annual deductible is met). Patient agreed to services and consent obtained.  Patient Care Team: Janith Lima, MD as PCP - General Stanford Breed Denice Bors, MD as PCP - Cardiology (Cardiology) Charlton Haws, [facility] as Pharmacist (Pharmacist)  Patient lives at home with his wife. [place]  Recent office visits: 09/27/20 Kevin Bradley (PCP) - Annual Exam. Start Pitavastatin Calcium 2 mg & Synthroid 75 mcg. Tdap given. Hematology referral. F/u 6 mos.  Recent consult visits: 10/10/20 [facility] (Oncology) - Polycythemia.  08/08/20 Dr Carole Civil (audiology): hearing aid  check  06/21/20 Dr Stanford Breed (cardiology): f/u Afib. Declines anticoagulation. ? PVCs as cause of palpitations - look into apple watch to record rhythm strip  Hospital visits: None in previous 6 months  Objective:  Lab Results  Component Value Date   CREATININE 0.86 10/10/2020   BUN 21 10/10/2020   GFR 83.64 09/27/2020   GFRNONAA >60 10/10/2020   GFRAA >60 01/05/2018   NA 139 10/10/2020   K 4.0 10/10/2020   CALCIUM 9.3 10/10/2020   CO2 26 10/10/2020   GLUCOSE 109 (H) 10/10/2020    Lab Results  Component Value Date/Time   HGBA1C 5.8 09/27/2020 01:05 PM   HGBA1C 6.2 02/19/2020 02:32 PM   GFR 83.64 09/27/2020 01:05 PM   GFR 84.48 02/19/2020 02:32 PM    Last diabetic Eye exam: No results found for: HMDIABEYEEXA  Last diabetic Foot exam: No results found for: HMDIABFOOTEX   Lab Results  Component Value Date   CHOL 162 09/27/2020   HDL 38.30 (L) 09/27/2020   LDLCALC 87 09/04/2019   LDLDIRECT 93.0 09/27/2020   TRIG 364.0 (H) 09/27/2020   CHOLHDL 4 09/27/2020    Hepatic Function Latest Ref Rng & Units 10/10/2020 09/27/2020 05/19/2019  Total Protein 6.5 - 8.1 g/dL 6.9 7.3 6.9  Albumin 3.5 - 5.0 g/dL 3.9 4.5 4.2  AST 15 - 41 U/L _0 ALT 0 - 44 U/L _1 Alk Phosphatase 38 - 126 U/L 69 81 64  Total Bilirubin 0.3 - 1.2 mg/dL 1.5(H) 1.4(H) 1.4(H)  Bilirubin, Direct 0.0 - 0.3 mg/dL - 0.2 -    Lab Results  Component Value Date/Time   TSH 3.73 09/27/2020 01:05 PM   TSH 1.66 01/09/2020 09:15 AM    CBC Latest Ref Rng & Units 10/10/2020 09/27/2020 02/19/2020  WBC 4.0 - 10.5 K/uL 6.4 7.0 8.6  Hemoglobin 13.0 - 17.0 g/dL 17.5(H) 18.5 Repeated and verified X2.([place]) 17.7(H)  Hematocrit 39.0 - 52.0 % 49.7 53.5(H) 51.4  Platelets 150 - 400 K/uL 238 241.0 249.0    Lab Results  Component Value Date/Time   VD25OH 21.37 (L) 09/04/2019 08:23 AM   VD25OH 48.85 03/07/2019 10:05 AM    Clinical ASCVD: No  The 10-year ASCVD risk score Mikey Bussing DC Jr., et al., 2013) is: 31.9%    Values used to calculate the score:     Age: 63 years     Sex: Male     Is Non-Hispanic African American: No     Diabetic: No     Tobacco smoker: No     Systolic Blood Pressure: 403 mmHg     Is BP treated: Yes     HDL Cholesterol: 38.3 mg/dL     Total Cholesterol: 162 mg/dL     CHA2DS2-VASc Score = 3  The patient's score is based upon: CHF History: No HTN History: Yes Diabetes History: No Stroke History: No Vascular Disease History: No Age Score: 2 Gender Score: 0     Depression screen [facility] 2/9 09/27/2020 03/07/2019 12/15/2017  Decreased Interest 0 0 0  Down, Depressed, Hopeless 0 0 0  PHQ - 2 Score 0 0 0     Social History   Tobacco Use  Smoking Status Former Smoker  . Packs/day: 2.00  . Years: 10.00  . Pack years: 20.00  . Types: Cigars, Cigarettes  . Quit date: 2005  . Years since quitting: 17.4  Smokeless Tobacco Never Used  Tobacco Comment   quit smoking cigars in 2017--04/15/18   BP Readings from Last 3 Encounters:  10/10/20 137/73  09/27/20 132/76  06/21/20 132/72   Pulse Readings from Last 3 Encounters:  10/10/20 (!) 48  09/27/20 82  06/21/20 62   Wt Readings from Last 3 Encounters:  10/10/20 188 lb 9.6 oz (85.5 kg)  09/27/20 189 lb 4 oz (85.8 kg)  06/21/20 189 lb 6.4 oz (85.9 kg)   BMI Readings from Last 3 Encounters:  10/10/20 25.58 kg/m  09/27/20 25.67 kg/m  06/21/20 25.69 kg/m    Assessment/Interventions: Review of patient past medical history, allergies, medications, health status, including review of consultants reports, laboratory and other test data, was performed as part of comprehensive evaluation and provision of chronic care management services.   SDOH:  (Social Determinants of Health) assessments and interventions performed: Yes SDOH Interventions   Flowsheet Row Most Recent Value  SDOH Interventions   Financial Strain Interventions Intervention Not Indicated     SDOH Screenings   Alcohol Screen: Not on file  Depression  (PHQ2-9): Low Risk   . PHQ-2 Score: 0  Financial Resource Strain: Low Risk   . Difficulty of Paying Living Expenses: Not very hard  Food Insecurity: Not on file  Housing: Not on file  Physical Activity: Not on file  Social Connections: Not on file  Stress: Not on file  Tobacco Use: Medium Risk  . Smoking Tobacco Use: Former Smoker  . Smokeless Tobacco Use: Never Used  Transportation Needs: Not on file    CCM Care Plan  Allergies  Allergen Reactions  . Penicillins     REACTION: passed out    Medications Reviewed Today  Reviewed by Charlton Haws, [facility] (Pharmacist) on 01/27/21 at [place] Status: <None>  Medication Order Taking? Sig Documenting Provider Last Dose Status Informant  amLODipine (NORVASC) 10 MG tablet [id number] Yes TAKE 1 TABLET(10 MG) BY MOUTH DAILY Stanford Breed Denice Bors, MD Taking Active   aspirin 81 MG EC tablet [id number] Yes Take 81 tablets by mouth 2 (two) times daily. [provider] Taking Active Self  Fish Oil OIL [id number] Yes Take 1 tablet by mouth daily. [provider] Taking Active   flecainide (TAMBOCOR) 50 MG tablet [id number] Yes TAKE 2 TABLETS(100 MG) BY MOUTH DAILY Stanford Breed, Denice Bors, MD Taking Active   levothyroxine (SYNTHROID) 75 MCG tablet [id number] Yes Take 1 tablet (75 mcg total) by mouth daily before breakfast. Janith Lima, MD Taking Active   losartan-hydrochlorothiazide [facility]) 100-12.5 MG tablet [id number] Yes TAKE 1 TABLET BY MOUTH DAILY Janith Lima, MD Taking Active   nebivolol (BYSTOLIC) 2.5 MG tablet [id number] Yes TAKE 1 TABLET(2.5 MG) BY MOUTH DAILY Stanford Breed Denice Bors, MD Taking Active   potassium chloride SA (KLOR-CON) 20 MEQ tablet [id number] Yes TAKE 1 TABLET(20 MEQ) BY MOUTH TWICE DAILY Janith Lima, MD Taking Active   VITAMIN D, CHOLECALCIFEROL, PO [id number] Yes Take by mouth daily. [provider] Taking Active           Patient Active Problem List   Diagnosis Date Noted  .  Polycythemia, secondary 10/10/2020  . Erythrocytosis 09/28/2020  . Encounter for general adult medical examination with abnormal findings 09/28/2020  . Need for diphtheria, tetanus, acellular pertussis and haemophilus influenzae vaccine 09/28/2020  . Tinnitus aurium, bilateral 02/19/2020  . Degenerative disc disease, cervical 01/10/2020  . Vitamin D deficiency 12/14/2017  . Hyperlipidemia with target LDL less than 100 09/17/2015  . Hypertriglyceridemia 02/10/2014  . COPD (chronic obstructive pulmonary disease) with chronic bronchitis ([place]) 11/06/2011  . Adrenal nodule ([place]) 06/25/2011  . Prediabetes 07/11/2010  . Paroxysmal atrial fibrillation ([place]) 12/03/2009  . Hypothyroidism 05/17/2009  . Fear of flying 05/17/2009  . Essential hypertension 05/17/2009  . BPH without obstruction/lower urinary tract symptoms 05/17/2009    Immunization History  Administered Date(s) Administered  . Influenza Whole 06/07/2009, 07/14/2010  . Influenza, High Dose Seasonal PF 07/03/2017, 06/07/2018, 06/06/2019  . Influenza,inj,Quad PF,6+ Mos 06/21/2020  . Influenza-Unspecified 06/21/2013, 07/10/2015, 07/18/2016  . PFIZER(Purple Top)SARS-COV-2 Vaccination 10/29/2019, 11/22/2019  . Pneumococcal Conjugate-13 09/17/2014  . Pneumococcal Polysaccharide-23 07/14/2010, 08/01/2018  . Td 08/11/2010  . Zoster 10/16/2013    Conditions to be addressed/monitored:  Hypertension, Hyperlipidemia, Atrial Fibrillation and Hypothyroidism  Care Plan : CCM Pharmacy Care Plan  Updates made by Charlton Haws, [place] since 01/27/2021 12:00 AM    Problem: Hypertension, Hyperlipidemia, Atrial Fibrillation and Hypothyroidism   Priority: High    Long-Range Goal: Disease management   This Visit's Progress: On track  Priority: High  Note:   Current Barriers:  . Unable to independently monitor therapeutic efficacy  Pharmacist Clinical Goal(s):  Marland Kitchen Patient will achieve adherence to monitoring guidelines and medication  adherence to achieve therapeutic efficacy through collaboration with PharmD and provider.   Interventions: . 1:1 collaboration with Janith Lima, MD regarding development and update of comprehensive plan of care as evidenced by provider attestation and co-signature . Inter-disciplinary care team collaboration (see longitudinal plan of care) . Comprehensive medication review performed; medication list updated in electronic medical record  Hypertension (BP goal <140/90) -Controlled - BP at home is at goal; pt endorses compliance and denies side effects;  he does report Bystolic is $01 per 90 days -Current treatment: . Amlodipine 10 mg daily . Losartan-HCTZ 100-12.5 mg daily . Bystolic 2.5 mg daily  -Medications previously tried: n/a  -Current home readings: (wrist cuff) - 120s-130s/60s -Denies hypotensive/hypertensive symptoms -Educated on BP goals and benefits of medications for prevention of heart attack, stroke and kidney damage; Importance of home blood pressure monitoring; Symptoms of hypotension and importance of maintaining adequate hydration; -Counseled to monitor BP at home weekly, document, and provide log at future appointments -Recommended to continue current medication  Hyperlipidemia: (LDL goal < 100) -Not ideally controlled - pt is not taking a statin (ASCVD risk 32%) -Current treatment: . Pitavastatin 2 mg daily - not taking . Fish oil . Aspirin 81 mg BID -Medications previously tried: a statin (unsure which) -Educated on Cholesterol goals;  Benefits of statin for ASCVD risk reduction; -Recommended to start Livalo 2 mg - pt declined  Atrial Fibrillation (Goal: prevent stroke and major bleeding) -Controlled - pt has declined anticoagulation previously; he endorses low resting HR in high 40s-50s sometimes, but denies symptoms of fatigue, dizziness -CHADSVASC: 3 -Current treatment: . Rate control: Flecainide 50 mg - 2 tab daily, Bystolic 2.5 mg  daily . Anticoagulation: none -Medications previously tried: Eliquis, Xarelto -Counseled on increased risk of stroke due to Afib and benefits of anticoagulation for stroke prevention; pt declined anticoagulation today and acknowledged increased risk of stroke -Counseled on side effects of flecainide, Bystolic and to contact cardiologist if they occur -Advised pt to request pharmacy switch his Bystolic to generic form -Recommended to continue current medication  COPD (Goal: control symptoms and prevent exacerbations) -Controlled - pt is doing well off of inhalers; denies shortness of breath -Pulmonary function testing: 05/2018 - normal pulmonary function -Exacerbations requiring treatment in last 6 months: none -Current treatment  . None -Medications previously tried: [place]   -Continue to monitor   Hypothyroidism (Goal: maintain TSH in goal range) -Controlled - TSH is at goal; pt takes levothyroxine with other meds in the morning -Current treatment  . Levothyroxine 75 mcg daily -Recommended to continue current medication  Health Maintenance -Vaccine gaps: TDAP, COVID #3 -Current therapy:  . [place]-Con 20 mEq BID . Vitamin D -Patient is satisfied with current therapy and denies issues -Recommended to continue current medication  Patient Goals/Self-Care Activities . Patient will:  - take medications as prescribed focus on medication adherence by routine check blood pressure weekly, document, and provide at future appointments      Medication Assistance: None required.  Patient affirms current coverage meets needs.  Patient's preferred pharmacy is:  [id number] - [address], [address] [place] [address] Phone: [phone] Fax: [phone]  Uses pill box? No - prefers bottles Pt endorses 100% compliance  We discussed: Current pharmacy is preferred with insurance plan and  patient is satisfied with pharmacy services Patient decided to: Continue current medication management strategy  Care Plan and Follow Up Patient Decision:  Patient agrees to Care Plan and Follow-up.  Plan: Telephone follow up appointment with care management team member scheduled for:  1 year  Charlene Brooke, PharmD, [place], CPP Clinical Pharmacist [facility] at [facility] [phone]

## 2021-01-27 NOTE — Patient Instructions (Addendum)
Visit Information  Phone number for Pharmacist: [phone]  Thank you for meeting with me to discuss your medications! I look forward to working with you to achieve your health care goals. Below is a summary of what we talked about during the visit:  Goals Addressed            This Visit's Progress   . Manage My Medicine       Timeframe:  Long-Range Goal Priority:  Medium Start Date:    01/27/21                         Expected End Date:   01/27/22                    Follow Up Date Nov 2022   - call for medicine refill 2 or 3 days before it runs out - call if I am sick and can't take my medicine - keep a list of all the medicines I take; vitamins and herbals too    Why is this important?   . These steps will help you keep on track with your medicines.   Notes:        Kevin Bradley was given information about Chronic Care Management services today including:  1. CCM service includes personalized support from designated clinical staff supervised by his physician, including individualized plan of care and coordination with other care providers 2. 24/7 contact phone numbers for assistance for urgent and routine care needs. 3. Standard insurance, coinsurance, copays and deductibles apply for chronic care management only during months in which we provide at least 20 minutes of these services. Most insurances cover these services at 100%, however patients may be responsible for any copay, coinsurance and/or deductible if applicable. This service may help you avoid the need for more expensive face-to-face services. 4. Only one practitioner may furnish and bill the service in a calendar month. 5. The patient may stop CCM services at any time (effective at the end of the month) by phone call to the office staff.  Patient agreed to services and verbal consent obtained.   Patient verbalizes understanding of instructions provided today and agrees to view in [place].  Telephone follow up  appointment with pharmacy team member scheduled for: 1 year  Charlene Brooke, PharmD, [place], CPP Clinical Pharmacist [facility] at [place] Maintenance After Age 32 After age 37, you are at a higher risk for certain long-term diseases and infections as well as injuries from falls. Falls are a major cause of broken bones and head injuries in people who are older than age 70. Getting regular preventive care can help to keep you healthy and well. Preventive care includes getting regular testing and making lifestyle changes as recommended by your health care provider. Talk with your health care provider about:  Which screenings and tests you should have. A screening is a test that checks for a disease when you have no symptoms.  A diet and exercise plan that is right for you. What should I know about screenings and tests to prevent falls? Screening and testing are the best ways to find a health problem early. Early diagnosis and treatment give you the best chance of managing medical conditions that are common after age 38. Certain conditions and lifestyle choices may make you more likely to have a fall. Your health care provider may recommend:  Regular vision checks. Poor vision and conditions such  as cataracts can make you more likely to have a fall. If you wear glasses, make sure to get your prescription updated if your vision changes.  Medicine review. Work with your health care provider to regularly review all of the medicines you are taking, including over-the-counter medicines. Ask your health care provider about any side effects that may make you more likely to have a fall. Tell your health care provider if any medicines that you take make you feel dizzy or sleepy.  Osteoporosis screening. Osteoporosis is a condition that causes the bones to get weaker. This can make the bones weak and cause them to break more easily.  Blood pressure screening. Blood  pressure changes and medicines to control blood pressure can make you feel dizzy.  Strength and balance checks. Your health care provider may recommend certain tests to check your strength and balance while standing, walking, or changing positions.  Foot health exam. Foot pain and numbness, as well as not wearing proper footwear, can make you more likely to have a fall.  Depression screening. You may be more likely to have a fall if you have a fear of falling, feel emotionally low, or feel unable to do activities that you used to do.  Alcohol use screening. Using too much alcohol can affect your balance and may make you more likely to have a fall. What actions can I take to lower my risk of falls? General instructions  Talk with your health care provider about your risks for falling. Tell your health care provider if: ? You fall. Be sure to tell your health care provider about all falls, even ones that seem minor. ? You feel dizzy, sleepy, or off-balance.  Take over-the-counter and prescription medicines only as told by your health care provider. These include any supplements.  Eat a healthy diet and maintain a healthy weight. A healthy diet includes low-fat dairy products, low-fat (lean) meats, and fiber from whole grains, beans, and lots of fruits and vegetables. Home safety  Remove any tripping hazards, such as rugs, cords, and clutter.  Install safety equipment such as grab bars in bathrooms and safety rails on stairs.  Keep rooms and walkways well-lit. Activity  Follow a regular exercise program to stay fit. This will help you maintain your balance. Ask your health care provider what types of exercise are appropriate for you.  If you need a cane or walker, use it as recommended by your health care provider.  Wear supportive shoes that have nonskid soles.   Lifestyle  Do not drink alcohol if your health care provider tells you not to drink.  If you drink alcohol, limit how  much you have: ? 0-1 drink a day for women. ? 0-2 drinks a day for men.  Be aware of how much alcohol is in your drink. In the [place] one drink equals one typical bottle of beer (12 oz), one-half glass of wine (5 oz), or one shot of hard liquor (1 oz).  Do not use any products that contain nicotine or tobacco, such as cigarettes and e-cigarettes. If you need help quitting, ask your health care provider. Summary  Having a healthy lifestyle and getting preventive care can help to protect your health and wellness after age 105.  Screening and testing are the best way to find a health problem early and help you avoid having a fall. Early diagnosis and treatment give you the best chance for managing medical conditions that are more common for people who are  older than age 44.  Falls are a major cause of broken bones and head injuries in people who are older than age 37. Take precautions to prevent a fall at home.  Work with your health care provider to learn what changes you can make to improve your health and wellness and to prevent falls. This information is not intended to replace advice given to you by your health care provider. Make sure you discuss any questions you have with your health care provider. Document Revised: 12/15/2018 Document Reviewed: 07/07/2017 Elsevier Patient Education  2021 [employer].

## 2021-02-11 ENCOUNTER — Other Ambulatory Visit: Payer: Self-pay | Admitting: Internal Medicine

## 2021-03-17 NOTE — Progress Notes (Signed)
   I, Kevin Bradley, LAT, ATC, am serving as scribe for Dr. Lynne Bradley.  Kevin Bradley is a 75 y.o. male who presents to [place] at [facility] today for B arm pain.  He was last seen by Kevin Bradley on 01/16/20 for neck and R-sided clavicular pain and was referred to PT but never completed any sessions.  Today, pt reports B shoulder/upper arm pain, R>L, x 5-6 months.  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   Exam:  BP 120/62 (BP Location: Right Arm, Patient Position: Sitting, Cuff Size: Normal)   Pulse 61   Ht 6' (1.829 m)   Wt 189 lb 6.4 oz (85.9 kg)   SpO2 96%   BMI 25.69 kg/m  General: Well Developed, well nourished, and in no acute distress.   MSK: Right shoulder normal-appearing normal motion some pain with abduction. Intact strength. Positive Hawkins and Neer's test.  Negative Yergason's and speeds test.  Left shoulder normal-appearing normal motion some pain with abduction. Normal strength. Positive Hawkins and Neer's test.  Negative Yergason's and speeds test.  C-spine: Normal-appearing nontender normal cervical motion.  Lab and Radiology Results  X-ray images right shoulder obtained today personally and independently interpreted Mild AC DJD.  No significant glenohumeral DJD.  No acute fractures. Await formal radiology review     Assessment and Plan: 75 y.o. male with bilateral shoulder pain right worse than left.  Pain ongoing for about 6 months.  Pain thought to be related to rotator cuff tendinopathy and impingement or bursitis.  Plan for home exercise program taught in clinic today by ATC.  Ideally he should be referred to physical therapy although he  declined that for now.  If not better with home exercise program he will let me know and I will refer to PT.  Recheck in 6 to 8 weeks if not better especially after PT would consider injection trial and potentially MRI.   PDMP not reviewed this encounter. Orders Placed This Encounter  Procedures   DG Shoulder Right    Standing Status:   Future    Number of Occurrences:   1    Standing Expiration Date:   03/19/2022    Order Specific Question:   Reason for Exam (SYMPTOM  OR DIAGNOSIS REQUIRED)    Answer:   eeval shoulder pain    Order Specific Question:   Preferred imaging location?    Answer:   [name]   No orders of the defined types were placed in this encounter.    Discussed warning signs or symptoms. Please see discharge instructions. Patient expresses understanding.   The above documentation has been reviewed and is accurate and complete Kevin Bradley, M.D.

## 2021-03-19 ENCOUNTER — Ambulatory Visit: Payer: PPO | Admitting: Family Medicine

## 2021-03-19 ENCOUNTER — Ambulatory Visit: Payer: Self-pay

## 2021-03-19 ENCOUNTER — Ambulatory Visit (INDEPENDENT_AMBULATORY_CARE_PROVIDER_SITE_OTHER): Payer: PPO

## 2021-03-19 ENCOUNTER — Encounter: Payer: Self-pay | Admitting: Family Medicine

## 2021-03-19 ENCOUNTER — Other Ambulatory Visit: Payer: Self-pay

## 2021-03-19 VITALS — BP 120/62 | HR 61 | Ht 72.0 in | Wt 189.4 lb

## 2021-03-19 DIAGNOSIS — M25512 Pain in left shoulder: Secondary | ICD-10-CM

## 2021-03-19 DIAGNOSIS — G8929 Other chronic pain: Secondary | ICD-10-CM | POA: Diagnosis not present

## 2021-03-19 DIAGNOSIS — M25511 Pain in right shoulder: Secondary | ICD-10-CM | POA: Diagnosis not present

## 2021-03-19 NOTE — Patient Instructions (Addendum)
Thank you for coming in today.   Please perform the exercise program that we have prepared for you and gone over in detail on a daily basis.  In addition to the handout you were provided you can access your program through: [email or web address]   Your unique program code is:   [email or web address]  If not improving let me know. First step should be PT.   Recheck in 6-8 weeks.   If not better I can do an injection.    Please get an Xray today before you leave

## 2021-03-20 DIAGNOSIS — D225 Melanocytic nevi of trunk: Secondary | ICD-10-CM | POA: Diagnosis not present

## 2021-03-20 DIAGNOSIS — Z872 Personal history of diseases of the skin and subcutaneous tissue: Secondary | ICD-10-CM | POA: Diagnosis not present

## 2021-03-20 DIAGNOSIS — L821 Other seborrheic keratosis: Secondary | ICD-10-CM | POA: Diagnosis not present

## 2021-03-20 DIAGNOSIS — L814 Other melanin hyperpigmentation: Secondary | ICD-10-CM | POA: Diagnosis not present

## 2021-03-20 DIAGNOSIS — D1801 Hemangioma of skin and subcutaneous tissue: Secondary | ICD-10-CM | POA: Diagnosis not present

## 2021-03-20 DIAGNOSIS — L905 Scar conditions and fibrosis of skin: Secondary | ICD-10-CM | POA: Diagnosis not present

## 2021-03-20 DIAGNOSIS — D485 Neoplasm of uncertain behavior of skin: Secondary | ICD-10-CM | POA: Diagnosis not present

## 2021-03-20 DIAGNOSIS — L820 Inflamed seborrheic keratosis: Secondary | ICD-10-CM | POA: Diagnosis not present

## 2021-03-20 DIAGNOSIS — Z85828 Personal history of other malignant neoplasm of skin: Secondary | ICD-10-CM | POA: Diagnosis not present

## 2021-03-21 NOTE — Progress Notes (Signed)
X-ray right shoulder shows some mild arthritis

## 2021-03-27 ENCOUNTER — Telehealth: Payer: Self-pay | Admitting: Pharmacist

## 2021-03-27 ENCOUNTER — Telehealth: Payer: Self-pay | Admitting: Internal Medicine

## 2021-03-27 NOTE — Progress Notes (Addendum)
    Chronic Care Management Pharmacy Assistant   Name: DEKLYN GIBBON  MRN: [id number] DOB: [birth year]  Reason for Encounter: Disease State - Hypertension   Recent office visits:  None noted  Recent consult visits:  03/19/21 Georgina Snell [facility]) - Chronic pain of both shoulders. Xray. F/u prn.  Hospital visits:  None in previous 6 months  Medications: Outpatient Encounter Medications as of 03/27/2021  Medication Sig   amLODipine (NORVASC) 10 MG tablet TAKE 1 TABLET(10 MG) BY MOUTH DAILY   aspirin 81 MG EC tablet Take 81 tablets by mouth 2 (two) times daily.   Fish Oil OIL Take 1 tablet by mouth daily.   flecainide (TAMBOCOR) 50 MG tablet TAKE 2 TABLETS(100 MG) BY MOUTH DAILY   levothyroxine (SYNTHROID) 75 MCG tablet Take 1 tablet (75 mcg total) by mouth daily before breakfast.   losartan-hydrochlorothiazide (HYZAAR) 100-12.5 MG tablet TAKE 1 TABLET BY MOUTH DAILY   nebivolol (BYSTOLIC) 2.5 MG tablet TAKE 1 TABLET(2.5 MG) BY MOUTH DAILY   potassium chloride SA (KLOR-CON) 20 MEQ tablet TAKE 1 TABLET(20 MEQ) BY MOUTH TWICE DAILY   VITAMIN D, CHOLECALCIFEROL, PO Take by mouth daily.   No facility-administered encounter medications on file as of 03/27/2021.    Reviewed chart prior to disease state call. Spoke with patient regarding BP  Recent Office Vitals: BP Readings from Last 3 Encounters:  03/19/21 120/62  10/10/20 137/73  09/27/20 132/76   Pulse Readings from Last 3 Encounters:  03/19/21 61  10/10/20 (!) 48  09/27/20 82    Wt Readings from Last 3 Encounters:  03/19/21 189 lb 6.4 oz (85.9 kg)  10/10/20 188 lb 9.6 oz (85.5 kg)  09/27/20 189 lb 4 oz (85.8 kg)     Kidney Function Lab Results  Component Value Date/Time   CREATININE 0.86 10/10/2020 11:19 AM   CREATININE 0.90 09/27/2020 01:05 PM   CREATININE 0.88 02/19/2020 02:32 PM   CREATININE 1.12 01/05/2018 01:53 PM   GFR 83.64 09/27/2020 01:05 PM   GFRNONAA >60 10/10/2020 11:19 AM   GFRAA >60 01/05/2018 01:53 PM     BMP Latest Ref Rng & Units 10/10/2020 09/27/2020 02/19/2020  Glucose 70 - 99 mg/dL 109(H) 85 105(H)  BUN 8 - 23 mg/dL 21 17 18   Creatinine 0.61 - 1.24 mg/dL 0.86 0.90 0.88  Sodium 135 - 145 mmol/L 139 137 139  Potassium 3.5 - 5.1 mmol/L 4.0 4.2 3.6  Chloride 98 - 111 mmol/L 107 99 103  CO2 22 - 32 mmol/L 26 31 30   Calcium 8.9 - 10.3 mg/dL 9.3 10.0 9.6    Current antihypertensive regimen:  Amlodipine 10 mg daily Losartan-HCTZ 195-09.3 mg daily Bystolic 2.5 mg daily   Adherence Review: Is the patient currently on ACE/ARB medication? Yes Does the patient have >5 day gap between last estimated fill dates? No Amlodipine - 03/17/21 90D Losartan-HCTZ - 10/13/69 24P Bystolic  - 04/15/97 33A  Star Rating Drugs: losartan-hydrochlorothiazide-last fill 02/11/21 90D  Attempted contact with Vernelle Emerald 3 times. Unsuccessful outreach, will attempt again next month.    Orinda Kenner, [place] Clinical Pharmacists Assistant [phone]

## 2021-03-27 NOTE — Telephone Encounter (Signed)
LVM for pt to rtn my call to schedule AWV with [facility]. Please schedule this appt if pt calls the office.  °

## 2021-03-30 ENCOUNTER — Other Ambulatory Visit: Payer: Self-pay | Admitting: Internal Medicine

## 2021-03-30 DIAGNOSIS — E039 Hypothyroidism, unspecified: Secondary | ICD-10-CM

## 2021-04-11 ENCOUNTER — Other Ambulatory Visit: Payer: Self-pay | Admitting: Internal Medicine

## 2021-04-11 ENCOUNTER — Other Ambulatory Visit: Payer: Self-pay

## 2021-04-11 ENCOUNTER — Ambulatory Visit (INDEPENDENT_AMBULATORY_CARE_PROVIDER_SITE_OTHER): Payer: PPO

## 2021-04-11 VITALS — BP 126/60 | HR 57 | Temp 98.0°F | Ht 72.0 in | Wt 190.0 lb

## 2021-04-11 DIAGNOSIS — Z0000 Encounter for general adult medical examination without abnormal findings: Secondary | ICD-10-CM

## 2021-04-11 DIAGNOSIS — E039 Hypothyroidism, unspecified: Secondary | ICD-10-CM

## 2021-04-11 MED ORDER — LEVOTHYROXINE SODIUM 75 MCG PO TABS: 75.0000 ug | ORAL_TABLET | Freq: Every day | ORAL | 0 refills | Status: DC

## 2021-04-11 NOTE — Patient Instructions (Signed)
Kevin Bradley , Thank you for taking time to come for your Medicare Wellness Visit. I appreciate your ongoing commitment to your health goals. Please review the following plan we discussed and let me know if I can assist you in the future.   Screening recommendations/referrals: Colonoscopy: last done 02/13/2015; due every 10 years (due 2026) Recommended yearly ophthalmology/optometry visit for glaucoma screening and checkup Recommended yearly dental visit for hygiene and checkup  Vaccinations: Influenza vaccine: 06/21/2020 Pneumococcal vaccine: 09/17/2014, 08/01/2018 Tdap vaccine: 08/11/2010; due every 10 years (Overdue) not covered by Medicare as preventative but will cover as treatment for an injury. Shingles vaccine: Please call your insurance company to determine your out of pocket expense for the Shingrix vaccine. You may receive this vaccine at your local pharmacy.   Covid-19: 10/29/2019, 11/22/2019  Advanced directives: Please bring a copy of your health care power of attorney and living will to the office at your convenience.  Conditions/risks identified: Yes; Client understands the importance of follow-up with providers by attending scheduled visits and discussed goals to eat healthier, increase physical activity, exercise the brain, socialize more, get enough sleep and make time for laughter.  Next appointment: Please schedule your next Medicare Wellness Visit with your Nurse Health Advisor in 1 year by calling [phone].  Preventive Care 76 Years and Older, Male Preventive care refers to lifestyle choices and visits with your health care provider that can promote health and wellness. What does preventive care include? A yearly physical exam. This is also called an annual well check. Dental exams once or twice a year. Routine eye exams. Ask your health care provider how often you should have your eyes checked. Personal lifestyle choices, including: Daily care of your teeth and  gums. Regular physical activity. Eating a healthy diet. Avoiding tobacco and drug use. Limiting alcohol use. Practicing safe sex. Taking low doses of aspirin every day. Taking vitamin and mineral supplements as recommended by your health care provider. What happens during an annual well check? The services and screenings done by your health care provider during your annual well check will depend on your age, overall health, lifestyle risk factors, and family history of disease. Counseling  Your health care provider may ask you questions about your: Alcohol use. Tobacco use. Drug use. Emotional well-being. Home and relationship well-being. Sexual activity. Eating habits. History of falls. Memory and ability to understand (cognition). Work and work Statistician. Screening  You may have the following tests or measurements: Height, weight, and BMI. Blood pressure. Lipid and cholesterol levels. These may be checked every 5 years, or more frequently if you are over 40 years old. Skin check. Lung cancer screening. You may have this screening every year starting at age 48 if you have a 30-pack-year history of smoking and currently smoke or have quit within the past 15 years. Fecal occult blood test (FOBT) of the stool. You may have this test every year starting at age 34. Flexible sigmoidoscopy or colonoscopy. You may have a sigmoidoscopy every 5 years or a colonoscopy every 10 years starting at age 33. Prostate cancer screening. Recommendations will vary depending on your family history and other risks. Hepatitis C blood test. Hepatitis B blood test. Sexually transmitted disease (STD) testing. Diabetes screening. This is done by checking your blood sugar (glucose) after you have not eaten for a while (fasting). You may have this done every 1-3 years. Abdominal aortic aneurysm (AAA) screening. You may need this if you are a current or former smoker. Osteoporosis. You  may be screened  starting at age 1 if you are at high risk. Talk with your health care provider about your test results, treatment options, and if necessary, the need for more tests. Vaccines  Your health care provider may recommend certain vaccines, such as: Influenza vaccine. This is recommended every year. Tetanus, diphtheria, and acellular pertussis (Tdap, Td) vaccine. You may need a Td booster every 10 years. Zoster vaccine. You may need this after age 49. Pneumococcal 13-valent conjugate (PCV13) vaccine. One dose is recommended after age 25. Pneumococcal polysaccharide (PPSV23) vaccine. One dose is recommended after age 31. Talk to your health care provider about which screenings and vaccines you need and how often you need them. This information is not intended to replace advice given to you by your health care provider. Make sure you discuss any questions you have with your health care provider. Document Released: 09/20/2015 Document Revised: 05/13/2016 Document Reviewed: 06/25/2015 Elsevier Interactive Patient Education  2017 [place] Prevention in the Home Falls can cause injuries. They can happen to people of all ages. There are many things you can do to make your home safe and to help prevent falls. What can I do on the outside of my home? Regularly fix the edges of walkways and driveways and fix any cracks. Remove anything that might make you trip as you walk through a door, such as a raised step or threshold. Trim any bushes or trees on the path to your home. Use bright outdoor lighting. Clear any walking paths of anything that might make someone trip, such as rocks or tools. Regularly check to see if handrails are loose or broken. Make sure that both sides of any steps have handrails. Any raised decks and porches should have guardrails on the edges. Have any leaves, snow, or ice cleared regularly. Use sand or salt on walking paths during winter. Clean up any spills in your garage  right away. This includes oil or grease spills. What can I do in the bathroom? Use night lights. Install grab bars by the toilet and in the tub and shower. Do not use towel bars as grab bars. Use non-skid mats or decals in the tub or shower. If you need to sit down in the shower, use a plastic, non-slip stool. Keep the floor dry. Clean up any water that spills on the floor as soon as it happens. Remove soap buildup in the tub or shower regularly. Attach bath mats securely with double-sided non-slip rug tape. Do not have throw rugs and other things on the floor that can make you trip. What can I do in the bedroom? Use night lights. Make sure that you have a light by your bed that is easy to reach. Do not use any sheets or blankets that are too big for your bed. They should not hang down onto the floor. Have a firm chair that has side arms. You can use this for support while you get dressed. Do not have throw rugs and other things on the floor that can make you trip. What can I do in the kitchen? Clean up any spills right away. Avoid walking on wet floors. Keep items that you use a lot in easy-to-reach places. If you need to reach something above you, use a strong step stool that has a grab bar. Keep electrical cords out of the way. Do not use floor polish or wax that makes floors slippery. If you must use wax, use non-skid floor wax. Do  not have throw rugs and other things on the floor that can make you trip. What can I do with my stairs? Do not leave any items on the stairs. Make sure that there are handrails on both sides of the stairs and use them. Fix handrails that are broken or loose. Make sure that handrails are as long as the stairways. Check any carpeting to make sure that it is firmly attached to the stairs. Fix any carpet that is loose or worn. Avoid having throw rugs at the top or bottom of the stairs. If you do have throw rugs, attach them to the floor with carpet tape. Make  sure that you have a light switch at the top of the stairs and the bottom of the stairs. If you do not have them, ask someone to add them for you. What else can I do to help prevent falls? Wear shoes that: Do not have high heels. Have rubber bottoms. Are comfortable and fit you well. Are closed at the toe. Do not wear sandals. If you use a stepladder: Make sure that it is fully opened. Do not climb a closed stepladder. Make sure that both sides of the stepladder are locked into place. Ask someone to hold it for you, if possible. Clearly mark and make sure that you can see: Any grab bars or handrails. First and last steps. Where the edge of each step is. Use tools that help you move around (mobility aids) if they are needed. These include: Canes. Walkers. Scooters. Crutches. Turn on the lights when you go into a dark area. Replace any light bulbs as soon as they burn out. Set up your furniture so you have a clear path. Avoid moving your furniture around. If any of your floors are uneven, fix them. If there are any pets around you, be aware of where they are. Review your medicines with your doctor. Some medicines can make you feel dizzy. This can increase your chance of falling. Ask your doctor what other things that you can do to help prevent falls. This information is not intended to replace advice given to you by your health care provider. Make sure you discuss any questions you have with your health care provider. Document Released: 06/20/2009 Document Revised: 01/30/2016 Document Reviewed: 09/28/2014 Elsevier Interactive Patient Education  2017 [employer].

## 2021-04-11 NOTE — Progress Notes (Signed)
Subjective:   Kevin Bradley is a 75 y.o. male who presents for Medicare Annual/Subsequent preventive examination.  Review of Systems     Cardiac Risk Factors include: advanced age (>35mn, >>43women);dyslipidemia;family history of premature cardiovascular disease;hypertension;male gender     Objective:    Today's Vitals   04/11/21 0946  BP: 126/60  Pulse: (!) 57  Temp: 98 F (36.7 C)  TempSrc: Temporal  SpO2: 97%  Weight: 190 lb (86.2 kg)  Height: 6' (1.829 m)  PainSc: 0-No pain   Body mass index is 25.77 kg/m.  Advanced Directives 04/11/2021 03/11/2018 01/05/2018 10/24/2016 02/01/2015  Does Patient Have a Medical Advance Directive? Yes No No Yes No  Type of Advance Directive H[place]Living will -  Does patient want to make changes to medical advance directive? No - Patient declined - - No - Patient declined -  Copy of HForsythin Chart? No - copy requested - - Yes -  Would patient like information on creating a medical advance directive? - No - Patient declined No - Patient declined - -    Current Medications (verified) Outpatient Encounter Medications as of 04/11/2021  Medication Sig   amLODipine (NORVASC) 10 MG tablet TAKE 1 TABLET(10 MG) BY MOUTH DAILY   aspirin 81 MG EC tablet Take 81 tablets by mouth 2 (two) times daily.   Fish Oil OIL Take 1 tablet by mouth daily.   flecainide (TAMBOCOR) 50 MG tablet TAKE 2 TABLETS(100 MG) BY MOUTH DAILY   losartan-hydrochlorothiazide (HYZAAR) 100-12.5 MG tablet TAKE 1 TABLET BY MOUTH DAILY   nebivolol (BYSTOLIC) 2.5 MG tablet TAKE 1 TABLET(2.5 MG) BY MOUTH DAILY   potassium chloride SA (KLOR-CON) 20 MEQ tablet TAKE 1 TABLET(20 MEQ) BY MOUTH TWICE DAILY   VITAMIN D, CHOLECALCIFEROL, PO Take by mouth daily.   [DISCONTINUED] levothyroxine (SYNTHROID) 75 MCG tablet Take 1 tablet (75 mcg total) by mouth daily before breakfast.   No facility-administered encounter  medications on file as of 04/11/2021.    Allergies (verified) Penicillins   History: Past Medical History:  Diagnosis Date   Anxiety    Atrial fibrillation (HCC)    BPH (benign prostatic hypertrophy)    External thrombosed hemorrhoids    History of colonoscopy    HTN (hypertension)    Hx of colonic polyps    Hypothyroidism    Low HDL (under 40)    Nephrolithiasis    Past Surgical History:  Procedure Laterality Date   APPENDECTOMY     cyst tongue  1990   benign   INGUINAL HERNIA REPAIR Right 1990   INGUINAL HERNIA REPAIR Left 1948   THYROIDECTOMY, PARTIAL  1990   TONSILLECTOMY  1948   Family History  Problem Relation Age of Onset   Heart attack Father    AAA (abdominal aortic aneurysm) Father 619  Alcohol abuse Other    Breast cancer Other    Cancer Neg Hx    Early death Neg Hx    Diabetes Neg Hx    Heart disease Neg Hx    Hyperlipidemia Neg Hx    Hypertension Neg Hx    Kidney disease Neg Hx    Stroke Neg Hx    Colon cancer Neg Hx    Social History   Socioeconomic History   Marital status: Married    Spouse name: Not on file   Number of children: Not on file   Years of education: Not  on file   Highest education level: Not on file  Occupational History   Occupation: Truck Geophysicist/field seismologist  Tobacco Use   Smoking status: Former    Packs/day: 2.00    Years: 10.00    Pack years: 20.00    Types: Cigars, Cigarettes    Quit date: 2005    Years since quitting: 17.6   Smokeless tobacco: Never   Tobacco comments:    quit smoking cigars in 2017--04/15/18  Vaping Use   Vaping Use: Never used  Substance and Sexual Activity   Alcohol use: No    Alcohol/week: 0.0 standard drinks   Drug use: No   Sexual activity: Yes  Other Topics Concern   Not on file  Social History Narrative   Not on file   Social Determinants of Health   Financial Resource Strain: Low Risk    Difficulty of Paying Living Expenses: Not hard at all  Food Insecurity: No Food Insecurity   Worried  About Charity fundraiser in the Last Year: Never true   [place] in the Last Year: Never true  Transportation Needs: No Transportation Needs   Lack of Transportation (Medical): No   Lack of Transportation (Non-Medical): No  Physical Activity: Sufficiently Active   Days of Exercise per Week: 5 days   Minutes of Exercise per Session: 30 min  Stress: No Stress Concern Present   Feeling of Stress : Not at all  Social Connections: Moderately Isolated   Frequency of Communication with Friends and Family: More than three times a week   Frequency of Social Gatherings with Friends and Family: Once a week   Attends Religious Services: Never   Marine scientist or Organizations: No   Attends Archivist Meetings: Never   Marital Status: Married    Tobacco Counseling Counseling given: Not Answered Tobacco comments: quit smoking cigars in 2017--04/15/18   Clinical Intake:  Pre-visit preparation completed: Yes  Pain : No/denies pain Pain Score: 0-No pain     BMI - recorded: 25.77 Nutritional Status: BMI 25 -29 Overweight Nutritional Risks: None Diabetes: No  How often do you need to have someone help you when you read instructions, pamphlets, or other written materials from your doctor or pharmacy?: 1 - Never What is the last grade level you completed in school?: 3 years of college  Diabetic? no  Interpreter Needed?: No  Information entered by :: Lisette Abu, LPN   Activities of Daily Living In your present state of health, do you have any difficulty performing the following activities: 04/11/2021  Hearing? N  Vision? N  Difficulty concentrating or making decisions? N  Walking or climbing stairs? N  Dressing or bathing? N  Doing errands, shopping? N  Preparing Food and eating ? N  Using the Toilet? N  In the past six months, have you accidently leaked urine? N  Do you have problems with loss of bowel control? N  Managing your Medications? N   Managing your Finances? N  Housekeeping or managing your Housekeeping? N  Some recent data might be hidden    Patient Care Team: Janith Lima, MD as PCP - General Stanford Breed, Denice Bors, MD as PCP - Cardiology (Cardiology) Charlton Haws, [facility] as Pharmacist (Pharmacist) Calvert Cantor, MD as Consulting Physician (Ophthalmology)  Indicate any recent Medical Services you may have received from other than [facility] providers in the past year (date may be approximate).     Assessment:   This is a  routine wellness examination for Kevin Bradley.  Hearing/Vision screen Hearing Screening - Comments:: Patient wears hearing aids. Vision Screening - Comments:: Patient has eye exam done annually with [facility].  Dietary issues and exercise activities discussed: Current Exercise Habits: Home exercise routine, Type of exercise: treadmill;walking;strength training/weights;Other - see comments (stationary bike; [place]), Time (Minutes): 30, Frequency (Times/Week): 5, Weekly Exercise (Minutes/Week): 150, Intensity: Moderate, Exercise limited by: None identified   Goals Addressed   None   Depression Screen PHQ 2/9 Scores 04/11/2021 09/27/2020 03/07/2019 12/15/2017 12/13/2017 10/24/2016 03/02/2013  PHQ - 2 Score 0 0 0 0 0 0 0    Fall Risk Fall Risk  04/11/2021 09/27/2020 03/07/2019 12/15/2017 12/13/2017  Falls in the past year? 0 0 0 No No  Number falls in past yr: 0 0 0 - -  Injury with Fall? 0 0 0 - -  Risk for fall due to : No Fall Risks No Fall Risks - - -  Follow up Falls evaluation completed Falls evaluation completed Falls evaluation completed - -    FALL RISK PREVENTION PERTAINING TO THE HOME:  Any stairs in or around the home? Yes  If so, are there any without handrails? No  Home free of loose throw rugs in walkways, pet beds, electrical cords, etc? Yes  Adequate lighting in your home to reduce risk of falls? Yes   ASSISTIVE DEVICES UTILIZED TO PREVENT FALLS:  Life  alert? No  Use of a cane, walker or w/c? No  Grab bars in the bathroom? Yes  Shower chair or bench in shower? No  Elevated toilet seat or a handicapped toilet? Yes   TIMED UP AND GO:  Was the test performed? Yes .  Length of time to ambulate 10 feet: 6 sec.   Gait steady and fast without use of assistive device  Cognitive Function: Normal cognitive status assessed by direct observation by this Nurse Health Advisor. No abnormalities found.          Immunizations Immunization History  Administered Date(s) Administered   Influenza Whole 06/07/2009, 07/14/2010   Influenza, High Dose Seasonal PF 07/03/2017, 06/07/2018, 06/06/2019   Influenza,inj,Quad PF,6+ Mos 06/21/2020   Influenza-Unspecified 06/21/2013, 07/10/2015, 07/18/2016   PFIZER(Purple Top)SARS-COV-2 Vaccination 10/29/2019, 11/22/2019   Pneumococcal Conjugate-13 09/17/2014   Pneumococcal Polysaccharide-23 07/14/2010, 08/01/2018   Td 08/11/2010   Zoster, Live 10/16/2013    TDAP status: Due, Education has been provided regarding the importance of this vaccine. Advised may receive this vaccine at local pharmacy or Health Dept. Aware to provide a copy of the vaccination record if obtained from local pharmacy or Health Dept. Verbalized acceptance and understanding.  Flu Vaccine status: Up to date  Pneumococcal vaccine status: Up to date  Covid-19 vaccine status: Completed vaccines  Qualifies for Shingles Vaccine? Yes   Zostavax completed Yes   Shingrix Completed?: No.    Education has been provided regarding the importance of this vaccine. Patient has been advised to call insurance company to determine out of pocket expense if they have not yet received this vaccine. Advised may also receive vaccine at local pharmacy or Health Dept. Verbalized acceptance and understanding.  Screening Tests Health Maintenance  Topic Date Due   Zoster Vaccines- Shingrix (1 of 2) Never done   COVID-19 Vaccine (3 - Booster for Pfizer  series) 04/23/2020   TETANUS/TDAP  08/11/2020   INFLUENZA VACCINE  04/07/2021   COLONOSCOPY (Pts 45-25yr Insurance coverage will need to be confirmed)  02/12/2025  Hepatitis C Screening  Completed   PNA vac Low Risk Adult  Completed   HPV VACCINES  Aged Out    Health Maintenance  Health Maintenance Due  Topic Date Due   Zoster Vaccines- Shingrix (1 of 2) Never done   COVID-19 Vaccine (3 - Booster for Pfizer series) 04/23/2020   TETANUS/TDAP  08/11/2020   INFLUENZA VACCINE  04/07/2021    Colorectal cancer screening: Type of screening: Colonoscopy. Completed 02/13/2015. Repeat every 10 years  Lung Cancer Screening: (Low Dose CT Chest recommended if Age 96-80 years, 30 pack-year currently smoking OR have quit w/in 15years.) does not qualify.   Lung Cancer Screening Referral: no  Additional Screening:  Hepatitis C Screening: does qualify; Completed: yes  Vision Screening: Recommended annual ophthalmology exams for early detection of glaucoma and other disorders of the eye. Is the patient up to date with their annual eye exam?  Yes  Who is the provider or what is the name of the office in which the patient attends annual eye exams? [facility] If pt is not established with a provider, would they like to be referred to a provider to establish care? No .   Dental Screening: Recommended annual dental exams for proper oral hygiene  Community Resource Referral / Chronic Care Management: CRR required this visit?  No   CCM required this visit?  No      Plan:     I have personally reviewed and noted the following in the patient's chart:   Medical and social history Use of alcohol, tobacco or illicit drugs  Current medications and supplements including opioid prescriptions. Patient is not currently taking opioid prescriptions. Functional ability and status Nutritional status Physical activity Advanced directives List of other physicians Hospitalizations, surgeries, and ER  visits in previous 12 months Vitals Screenings to include cognitive, depression, and falls Referrals and appointments  In addition, I have reviewed and discussed with patient certain preventive protocols, quality metrics, and best practice recommendations. A written personalized care plan for preventive services as well as general preventive health recommendations were provided to patient.     Sheral Flow, LPN   [id number]   Nurse Notes: n/a

## 2021-05-06 NOTE — Progress Notes (Signed)
I, Wendy Poet, LAT, ATC, am serving as scribe for Dr. Lynne Leader.  Kevin Bradley is a 75 y.o. male who presents to [place] at [facility] today for f/u of B shoulder and upper arm pain, R>L.  He was last seen by Dr. Georgina Snell on 03/19/21 and was shown a HEP focusing on RC and peri-scapular strengthening.  Pt declined a referral to PT at his last visit.  Today, pt reports that his B shoulder pain is improving slightly (10% on the L and 2-3% on the R).  His biggest c/o remains decreased strength w/ his shoulder in an aBducted position (90 deg).  Diagnostic testing: R shoulder XR- 03/19/21; C-spine XR- 01/09/20   Pertinent review of systems: No fevers or chills  Relevant historical information: COPD.  Paroxysmal atrial fibrillation.  Hyperlipidemia.  BPH.  Hypothyroidism.   Exam:  BP 116/70 (BP Location: Left Arm, Patient Position: Sitting, Cuff Size: Normal)   Pulse (!) 54   Ht 6' (1.829 m)   Wt 190 lb (86.2 kg)   SpO2 95%   BMI 25.77 kg/m  General: Well Developed, well nourished, and in no acute distress.   MSK: Right shoulder normal-appearing normal motion pain with abduction. Intact strength to abduction external and internal rotation. Positive Hawkins and Neer's test positive empty can test negative Yergason's and speeds test.    Lab and Radiology Results  Diagnostic Limited MSK Ultrasound of: Right shoulder Biceps tendon intact normal-appearing Subscapularis tendon is intact. Supraspinatus tendon is intact with moderate subacromial bursitis present. Calcific change distal supraspinatus tendon insertion present. Infraspinatus tendon is intact. AC joint degenerative with effusion. Impression: Subacromial bursitis   DG Shoulder Right  Result Date: 03/21/2021 CLINICAL DATA:  Right shoulder pain for 6 months EXAM: RIGHT SHOULDER - 2+ VIEW COMPARISON:  01/09/2020 FINDINGS: There is no evidence of fracture or dislocation. Mild glenohumeral and  acromioclavicular osteoarthritis. Soft tissues are unremarkable. IMPRESSION: Mild glenohumeral and acromioclavicular osteoarthritis. Electronically Signed   By: Davina Poke D.O.   On: 03/21/2021 10:38   I, Lynne Leader, personally (independently) visualized and performed the interpretation of the images attached in this note.    Assessment and Plan: 75 y.o. male with bilateral shoulder pain now mostly right-sided after trial of home exercise program over the last 6 weeks.  Pain thought to be primarily related to subacromial bursitis and impingement with some rotator cuff tendinopathy.  Discussed options.  Patient would very much like to avoid an injection.  Plan to refer to physical therapy.  Recheck back in 8 weeks.  If not better consider MRI or injection as next step.  PDMP not reviewed this encounter. Orders Placed This Encounter  Procedures   [place] LIMITED JOINT SPACE STRUCTURES UP BILAT(NO LINKED CHARGES)    Order Specific Question:   Reason for Exam (SYMPTOM  OR DIAGNOSIS REQUIRED)    Answer:   B shoulders    Order Specific Question:   Preferred imaging location?    Answer:   [place]   Ambulatory referral to Physical Therapy    Referral Priority:   Routine    Referral Type:   Physical Medicine    Referral Reason:   Specialty Services Required    Requested Specialty:   Physical Therapy   No orders of the defined types were placed in this encounter.    Discussed warning signs or symptoms. Please see discharge instructions. Patient expresses understanding.   The above documentation has been reviewed and is  accurate and complete Lynne Leader, M.D.

## 2021-05-07 ENCOUNTER — Ambulatory Visit: Payer: PPO | Admitting: Family Medicine

## 2021-05-07 ENCOUNTER — Other Ambulatory Visit: Payer: Self-pay

## 2021-05-07 ENCOUNTER — Ambulatory Visit: Payer: Self-pay

## 2021-05-07 ENCOUNTER — Encounter: Payer: Self-pay | Admitting: Family Medicine

## 2021-05-07 VITALS — BP 116/70 | HR 54 | Ht 72.0 in | Wt 190.0 lb

## 2021-05-07 DIAGNOSIS — M25512 Pain in left shoulder: Secondary | ICD-10-CM | POA: Diagnosis not present

## 2021-05-07 DIAGNOSIS — M25511 Pain in right shoulder: Secondary | ICD-10-CM

## 2021-05-07 DIAGNOSIS — G8929 Other chronic pain: Secondary | ICD-10-CM | POA: Diagnosis not present

## 2021-05-07 NOTE — Patient Instructions (Signed)
Thank you for coming in today.   I've referred you to Physical Therapy.  Let us know if you don't hear from them in one week.   Recheck in 8 weeks.   Let me know if you have trouble getting into PT or dont like it location.

## 2021-05-08 ENCOUNTER — Encounter: Payer: Self-pay | Admitting: Internal Medicine

## 2021-05-08 ENCOUNTER — Ambulatory Visit (INDEPENDENT_AMBULATORY_CARE_PROVIDER_SITE_OTHER): Payer: PPO | Admitting: Internal Medicine

## 2021-05-08 VITALS — BP 134/68 | HR 60 | Temp 97.8°F | Ht 72.0 in | Wt 189.0 lb

## 2021-05-08 DIAGNOSIS — R7303 Prediabetes: Secondary | ICD-10-CM | POA: Diagnosis not present

## 2021-05-08 DIAGNOSIS — Z23 Encounter for immunization: Secondary | ICD-10-CM

## 2021-05-08 DIAGNOSIS — E039 Hypothyroidism, unspecified: Secondary | ICD-10-CM

## 2021-05-08 DIAGNOSIS — I480 Paroxysmal atrial fibrillation: Secondary | ICD-10-CM

## 2021-05-08 DIAGNOSIS — I10 Essential (primary) hypertension: Secondary | ICD-10-CM | POA: Diagnosis not present

## 2021-05-08 DIAGNOSIS — E785 Hyperlipidemia, unspecified: Secondary | ICD-10-CM

## 2021-05-08 LAB — CBC WITH DIFFERENTIAL/PLATELET
Basophils Absolute: 0 10*3/uL (ref 0.0–0.1)
Basophils Relative: 0.6 % (ref 0.0–3.0)
Eosinophils Absolute: 0.2 10*3/uL (ref 0.0–0.7)
Eosinophils Relative: 2.9 % (ref 0.0–5.0)
HCT: 49.6 % (ref 39.0–52.0)
Hemoglobin: 17.1 g/dL — ABNORMAL HIGH (ref 13.0–17.0)
Lymphocytes Relative: 23.8 % (ref 12.0–46.0)
Lymphs Abs: 1.7 10*3/uL (ref 0.7–4.0)
MCHC: 34.5 g/dL (ref 30.0–36.0)
MCV: 91.2 fl (ref 78.0–100.0)
Monocytes Absolute: 0.6 10*3/uL (ref 0.1–1.0)
Monocytes Relative: 8.8 % (ref 3.0–12.0)
Neutro Abs: 4.6 10*3/uL (ref 1.4–7.7)
Neutrophils Relative %: 63.9 % (ref 43.0–77.0)
Platelets: 235 10*3/uL (ref 150.0–400.0)
RBC: 5.44 Mil/uL (ref 4.22–5.81)
RDW: 12.5 % (ref 11.5–15.5)
WBC: 7.2 10*3/uL (ref 4.0–10.5)

## 2021-05-08 LAB — LIPID PANEL
Cholesterol: 149 mg/dL (ref 0–200)
HDL: 36.9 mg/dL — ABNORMAL LOW (ref >39.00)
NonHDL: 112.16
Total CHOL/HDL Ratio: 4
Triglycerides: 229 mg/dL — ABNORMAL HIGH (ref 0.0–149.0)
VLDL: 45.8 mg/dL — ABNORMAL HIGH (ref 0.0–40.0)

## 2021-05-08 LAB — TSH: TSH: 3.91 u[IU]/mL (ref 0.35–5.50)

## 2021-05-08 LAB — HEMOGLOBIN A1C: Hgb A1c MFr Bld: 5.9 % (ref 4.6–6.5)

## 2021-05-08 LAB — LDL CHOLESTEROL, DIRECT: Direct LDL: 88 mg/dL

## 2021-05-08 NOTE — Progress Notes (Signed)
Subjective:  Patient ID: Kevin Bradley, male    DOB: [birth year]  Age: 75 y.o. MRN: [id number]  CC: Hypothyroidism and Hyperlipidemia  This visit occurred during the SARS-CoV-2 public health emergency.  Safety protocols were in place, including screening questions prior to the visit, additional usage of staff PPE, and extensive cleaning of exam room while observing appropriate contact time as indicated for disinfecting solutions.    HPI Kevin Bradley presents for f/up - He is active and denies chest pain, shortness of breath, diaphoresis, edema, dizziness, or lightheadedness.  Outpatient Medications Prior to Visit  Medication Sig Dispense Refill   amLODipine (NORVASC) 10 MG tablet TAKE 1 TABLET(10 MG) BY MOUTH DAILY 90 tablet 2   aspirin 81 MG EC tablet Take 81 tablets by mouth 2 (two) times daily.     Fish Oil OIL Take 1 tablet by mouth daily.     flecainide (TAMBOCOR) 50 MG tablet TAKE 2 TABLETS(100 MG) BY MOUTH DAILY 180 tablet 4   losartan-hydrochlorothiazide (HYZAAR) 100-12.5 MG tablet TAKE 1 TABLET BY MOUTH DAILY 90 tablet 1   nebivolol (BYSTOLIC) 2.5 MG tablet TAKE 1 TABLET(2.5 MG) BY MOUTH DAILY 90 tablet 3   potassium chloride SA (KLOR-CON) 20 MEQ tablet TAKE 1 TABLET(20 MEQ) BY MOUTH TWICE DAILY 180 tablet 0   VITAMIN D, CHOLECALCIFEROL, PO Take by mouth daily.     levothyroxine (SYNTHROID) 75 MCG tablet TAKE 1 TABLET(75 MCG) BY MOUTH DAILY BEFORE BREAKFAST 90 tablet 0   No facility-administered medications prior to visit.    ROS Review of Systems  Constitutional:  Negative for diaphoresis and fatigue.  HENT: Negative.    Eyes: Negative.   Respiratory:  Negative for chest tightness, shortness of breath and wheezing.   Cardiovascular:  Negative for chest pain, palpitations and leg swelling.  Gastrointestinal:  Negative for abdominal pain, constipation, diarrhea, nausea and vomiting.  Endocrine: Negative for cold intolerance and heat intolerance.  Genitourinary:  Negative.  Negative for difficulty urinating.  Musculoskeletal:  Negative for arthralgias and myalgias.  Skin: Negative.   Neurological:  Negative for dizziness, weakness and light-headedness.  Hematological:  Negative for adenopathy. Does not bruise/bleed easily.  Psychiatric/Behavioral: Negative.     Objective:  BP 134/68 (BP Location: Left Arm, Patient Position: Sitting, Cuff Size: Large)   Pulse 60   Temp 97.8 F (36.6 C) (Oral)   Ht 6' (1.829 m)   Wt 189 lb (85.7 kg)   SpO2 97%   BMI 25.63 kg/m   BP Readings from Last 3 Encounters:  05/08/21 134/68  05/07/21 116/70  04/11/21 126/60    Wt Readings from Last 3 Encounters:  05/08/21 189 lb (85.7 kg)  05/07/21 190 lb (86.2 kg)  04/11/21 190 lb (86.2 kg)    Physical Exam Vitals reviewed.  Constitutional:      Appearance: Normal appearance.  HENT:     Nose: Nose normal.     Mouth/Throat:     Mouth: Mucous membranes are moist.  Eyes:     Conjunctiva/sclera: Conjunctivae normal.  Cardiovascular:     Rate and Rhythm: Normal rate and regular rhythm.     Heart sounds: No murmur heard. Pulmonary:     Effort: Pulmonary effort is normal.     Breath sounds: No stridor. No wheezing, rhonchi or rales.  Abdominal:     General: Abdomen is flat.     Palpations: There is no mass.     Tenderness: There is no abdominal tenderness. There is no guarding.  Hernia: No hernia is present.  Musculoskeletal:        General: Normal range of motion.     Cervical back: Neck supple.     Right lower leg: No edema.     Left lower leg: No edema.  Lymphadenopathy:     Cervical: No cervical adenopathy.  Skin:    General: Skin is warm and dry.  Neurological:     General: No focal deficit present.     Mental Status: He is alert.    Lab Results  Component Value Date   WBC 7.2 05/08/2021   HGB 17.1 (H) 05/08/2021   HCT 49.6 05/08/2021   PLT 235.0 05/08/2021   GLUCOSE 109 (H) 10/10/2020   CHOL 149 05/08/2021   TRIG 229.0 (H)  05/08/2021   HDL 36.90 (L) 05/08/2021   LDLDIRECT 88.0 05/08/2021   LDLCALC 87 09/04/2019   ALT 14 10/10/2020   AST 15 10/10/2020   NA 139 10/10/2020   K 4.0 10/10/2020   CL 107 10/10/2020   CREATININE 0.86 10/10/2020   BUN 21 10/10/2020   CO2 26 10/10/2020   TSH 3.91 05/08/2021   PSA 2.62 09/27/2020   HGBA1C 5.9 05/08/2021    VAS [place] LOWER EXTREMITY VENOUS REFLUX  Result Date: 07/13/2019  Lower Venous Reflux Study Indications: Swelling, Edema, Pain, and redness. Other Indications: Patent wears compression stocking and noticed bilateral rash,                    left worse than right. Performing Technologist: Delorise Shiner RVT  Examination Guidelines: A complete evaluation includes B-mode imaging, spectral Doppler, color Doppler, and power Doppler as needed of all accessible portions of each vessel. Bilateral testing is considered an integral part of a complete examination. Limited examinations for reoccurring indications may be performed as noted. The reflux portion of the exam is performed with the patient in reverse Trendelenburg.  Right Technical Findings: No evidence of DVT, SVT, or Baker's cyst. The sapheno-femoral junction is competent. No evidence of GSV reflux. No evidence of SSV reflux. No evidence of deep venous reflux.  Left Technical Findings: No evidence of DVT, SVT, or Baker's cyst. The sapheno-femoral junction is competent. No evidence of GSV reflux. No evidence of SSV reflux. The left common femoral vein demonstrates deep venous reflux.  Venous Reflux Times Normal value < 0.5 sec +--------------+------+---------+--------+--------+ RIGHT         RefluxReflux NoDiameterComments                Yes                            +--------------+------+---------+--------+--------+ CFV                    no                     +--------------+------+---------+--------+--------+ FV prox                no                      +--------------+------+---------+--------+--------+ Popliteal              no                     +--------------+------+---------+--------+--------+ GSV at SFJ             no     0.726           +--------------+------+---------+--------+--------+  GSV prox thigh         no     0.498           +--------------+------+---------+--------+--------+ GSV mid thigh          no     0.461           +--------------+------+---------+--------+--------+ GSV dist thigh         no     0.342           +--------------+------+---------+--------+--------+ GSV at knee            no     0.388           +--------------+------+---------+--------+--------+ GSV prox calf                 0.393           +--------------+------+---------+--------+--------+ GSV mid calf                  0.397           +--------------+------+---------+--------+--------+ SSV Pop Fossa                 0.260           +--------------+------+---------+--------+--------+ SSV prox calf          no     0.420           +--------------+------+---------+--------+--------+ SSV mid calf           no     0.356           +--------------+------+---------+--------+--------+  +--------------+------+---------+--------+--------+ LEFT          RefluxReflux NoDiameterComments                Yes                            +--------------+------+---------+--------+--------+ CFV            yes                            +--------------+------+---------+--------+--------+ FV prox                no                     +--------------+------+---------+--------+--------+ Popliteal              no                     +--------------+------+---------+--------+--------+ GSV at [facility]             no     0.739           +--------------+------+---------+--------+--------+ GSV prox thigh         no     0.534           +--------------+------+---------+--------+--------+ GSV mid thigh           no     0.603           +--------------+------+---------+--------+--------+ GSV dist thigh         no     0.438           +--------------+------+---------+--------+--------+ GSV at knee            no     0.475           +--------------+------+---------+--------+--------+ GSV prox calf  0.429           +--------------+------+---------+--------+--------+ GSV mid calf                  0.489           +--------------+------+---------+--------+--------+ SSV Pop Fossa                 0.256           +--------------+------+---------+--------+--------+ SSV prox calf          no     0.219           +--------------+------+---------+--------+--------+ SSV mid calf           no     0.246           +--------------+------+---------+--------+--------+   Summary: Right: There is no evidence of deep vein thrombosis in the lower extremity. There is no evidence of superficial venous thrombosis. No cystic structure found in the popliteal fossa. Left: Evidence of chronic venous insufficiency is detected in the deep venous system. There is no evidence of deep vein thrombosis in the lower extremity. There is no evidence of superficial venous thrombosis. No cystic structure found in the popliteal fossa.  *See table(s) above for measurements and observations. Electronically signed by Deitra Mayo MD on 07/13/2019 at 10:30:24 AM.    Final     Assessment & Plan:   Kevin Bradley was seen today for hypothyroidism and hyperlipidemia.  Diagnoses and all orders for this visit:  Flu vaccine need -     Flu Vaccine QUAD High Dose(Fluad)  Acquired hypothyroidism- His TSH is in the normal range.  He will stay on current dose of levothyroxine. -     CBC with Differential/Platelet; Future -     TSH; Future -     TSH -     CBC with Differential/Platelet -     levothyroxine (SYNTHROID) 75 MCG tablet; TAKE 1 TABLET(75 MCG) BY MOUTH DAILY BEFORE BREAKFAST  Essential hypertension-  His blood pressure is adequately well controlled. -     CBC with Differential/Platelet; Future -     CBC with Differential/Platelet  Paroxysmal atrial fibrillation ([place])- He has good rate and rhythm control. -     TSH; Future -     TSH  Prediabetes -     Hemoglobin A1c; Future -     Hemoglobin A1c  Hyperlipidemia with target LDL less than 100- LDL goal achieved. Doing well on the statin  -     Lipid panel; Future -     Lipid panel  Other orders -     Cancel: Flu Vaccine QUAD 6+ mos PF IM (Fluarix Quad PF) -     LDL cholesterol, direct  I am having Kevin Bradley "Collie Siad" maintain his Fish Oil, aspirin, (VITAMIN D, CHOLECALCIFEROL, PO), flecainide, nebivolol, losartan-hydrochlorothiazide, amLODipine, potassium chloride SA, and levothyroxine.  Meds ordered this encounter  Medications   levothyroxine (SYNTHROID) 75 MCG tablet    Sig: TAKE 1 TABLET(75 MCG) BY MOUTH DAILY BEFORE BREAKFAST    Dispense:  90 tablet    Refill:  1      Follow-up: No follow-ups on file.  Scarlette Calico, MD

## 2021-05-09 ENCOUNTER — Encounter: Payer: Self-pay | Admitting: Internal Medicine

## 2021-05-09 MED ORDER — LEVOTHYROXINE SODIUM 75 MCG PO TABS: ORAL_TABLET | ORAL | 1 refills | Status: DC

## 2021-05-13 ENCOUNTER — Other Ambulatory Visit: Payer: Self-pay | Admitting: Internal Medicine

## 2021-05-13 DIAGNOSIS — I10 Essential (primary) hypertension: Secondary | ICD-10-CM

## 2021-05-26 ENCOUNTER — Encounter: Payer: Self-pay | Admitting: Internal Medicine

## 2021-05-27 ENCOUNTER — Other Ambulatory Visit: Payer: Self-pay | Admitting: Internal Medicine

## 2021-05-27 DIAGNOSIS — F411 Generalized anxiety disorder: Secondary | ICD-10-CM

## 2021-05-27 MED ORDER — DIAZEPAM 5 MG PO TABS: 5.0000 mg | ORAL_TABLET | Freq: Two times a day (BID) | ORAL | 0 refills | Status: AC | PRN

## 2021-06-02 ENCOUNTER — Other Ambulatory Visit: Payer: Self-pay | Admitting: Internal Medicine

## 2021-06-02 ENCOUNTER — Encounter: Payer: Self-pay | Admitting: Internal Medicine

## 2021-06-02 DIAGNOSIS — U071 COVID-19: Secondary | ICD-10-CM

## 2021-06-02 DIAGNOSIS — J208 Acute bronchitis due to other specified organisms: Secondary | ICD-10-CM | POA: Insufficient documentation

## 2021-06-02 MED ORDER — MOLNUPIRAVIR EUA 200MG CAPSULE: 4.0000 | ORAL_CAPSULE | Freq: Two times a day (BID) | ORAL | 0 refills | Status: AC

## 2021-07-01 ENCOUNTER — Other Ambulatory Visit: Payer: Self-pay | Admitting: Cardiology

## 2021-07-03 NOTE — Progress Notes (Signed)
HPI: FU atrial fibrillation. A previous cardioNet monitor revealed sinus rhythm with occasional bursts of atrial fibrillation. He had an episode of chest pain approximately 9 years ago and had a cardiac catheterization that was normal by his report perfomed in [place]. Patient placed on flecanide previously. A followup stress echocardiogram showed no ischemia and normal LV function. There was a question of septal hypokinesis both at rest and with stress. There was no exercise induced arrhythmias. Cardionet in Aug 2012 showed sinus with PAT but no VT. Previously declined anticoagulants. Abdominal ultrasound in May of 2014 showed no aneurysm.  Echocardiogram September 2020 showed normal LV function, mild left ventricular hypertrophy, mild left atrial enlargement.  Since I last saw him he denies dyspnea, chest pain or syncope.  Occasional brief nonsustained palpitations for seconds.  Current Outpatient Medications  Medication Sig Dispense Refill   amLODipine (NORVASC) 10 MG tablet TAKE 1 TABLET(10 MG) BY MOUTH DAILY 90 tablet 2   aspirin 81 MG EC tablet Take 81 tablets by mouth 2 (two) times daily.     Fish Oil OIL Take 1 tablet by mouth daily.     flecainide (TAMBOCOR) 50 MG tablet TAKE 2 TABLETS(100 MG) BY MOUTH DAILY 180 tablet 4   levothyroxine (SYNTHROID) 75 MCG tablet TAKE 1 TABLET(75 MCG) BY MOUTH DAILY BEFORE BREAKFAST 90 tablet 1   losartan-hydrochlorothiazide (HYZAAR) 100-12.5 MG tablet TAKE 1 TABLET BY MOUTH DAILY 90 tablet 1   nebivolol (BYSTOLIC) 2.5 MG tablet TAKE 1 TABLET(2.5 MG) BY MOUTH DAILY 90 tablet 3   potassium chloride SA (KLOR-CON) 20 MEQ tablet TAKE 1 TABLET(20 MEQ) BY MOUTH TWICE DAILY 180 tablet 0   VITAMIN D, CHOLECALCIFEROL, PO Take by mouth daily.     No current facility-administered medications for this visit.     Past Medical History:  Diagnosis Date   Anxiety    Atrial fibrillation (HCC)    BPH (benign prostatic hypertrophy)    External thrombosed  hemorrhoids    History of colonoscopy    HTN (hypertension)    Hx of colonic polyps    Hypothyroidism    Low HDL (under 40)    Nephrolithiasis     Past Surgical History:  Procedure Laterality Date   APPENDECTOMY     cyst tongue  1990   benign   INGUINAL HERNIA REPAIR Right 1990   INGUINAL HERNIA REPAIR Left 1948   THYROIDECTOMY, PARTIAL  1990   TONSILLECTOMY  1948    Social History   Socioeconomic History   Marital status: Married    Spouse name: Not on file   Number of children: Not on file   Years of education: Not on file   Highest education level: Not on file  Occupational History   Occupation: Truck Geophysicist/field seismologist  Tobacco Use   Smoking status: Former    Packs/day: 2.00    Years: 10.00    Pack years: 20.00    Types: Cigars, Cigarettes    Quit date: 2005    Years since quitting: 17.8   Smokeless tobacco: Never   Tobacco comments:    quit smoking cigars in 2017--04/15/18  Vaping Use   Vaping Use: Never used  Substance and Sexual Activity   Alcohol use: No    Alcohol/week: 0.0 standard drinks   Drug use: No   Sexual activity: Yes  Other Topics Concern   Not on file  Social History Narrative   Not on file   Social Determinants of Health   Financial  Resource Strain: Low Risk    Difficulty of Paying Living Expenses: Not hard at all  Food Insecurity: No Food Insecurity   Worried About Charity fundraiser in the Last Year: Never true   Ran Out of Food in the Last Year: Never true  Transportation Needs: No Transportation Needs   Lack of Transportation (Medical): No   Lack of Transportation (Non-Medical): No  Physical Activity: Sufficiently Active   Days of Exercise per Week: 5 days   Minutes of Exercise per Session: 30 min  Stress: No Stress Concern Present   Feeling of Stress : Not at all  Social Connections: Moderately Isolated   Frequency of Communication with Friends and Family: More than three times a week   Frequency of Social Gatherings with Friends and  Family: Once a week   Attends Religious Services: Never   Marine scientist or Organizations: No   Attends Music therapist: Never   Marital Status: Married  Human resources officer Violence: Not At Risk   Fear of Current or Ex-Partner: No   Emotionally Abused: No   Physically Abused: No   Sexually Abused: No    Family History  Problem Relation Age of Onset   Heart attack Father    AAA (abdominal aortic aneurysm) Father 25   Alcohol abuse Other    Breast cancer Other    Cancer Neg Hx    Early death Neg Hx    Diabetes Neg Hx    Heart disease Neg Hx    Hyperlipidemia Neg Hx    Hypertension Neg Hx    Kidney disease Neg Hx    Stroke Neg Hx    Colon cancer Neg Hx     ROS: no fevers or chills, productive cough, hemoptysis, dysphasia, odynophagia, melena, hematochezia, dysuria, hematuria, rash, seizure activity, orthopnea, PND, pedal edema, claudication. Remaining systems are negative.  Physical Exam: Well-developed well-nourished in no acute distress.  Skin is warm and dry.  HEENT is normal.  Neck is supple.  Chest is clear to auscultation with normal expansion.  Cardiovascular exam is regular rate and rhythm.  Abdominal exam nontender or distended. No masses palpated. Extremities show no edema. neuro grossly intact  ECG-sinus bradycardia with first-degree AV block, no ST changes.  Ersonally reviewed  A/P  1 paroxysmal atrial fibrillation-patient remains in sinus.  Continue flecainide and Bystolic.  Patient continues to decline anticoagulation.  He understands the higher risk of CVA.  2 hypertension-blood pressure controlled.  Continue present medications.  Check potassium and renal function.  3 hyperlipidemia-continue statin.  4 palpitations-continue beta-blocker.  Symptoms have improved.  Kirk Ruths, MD

## 2021-07-10 ENCOUNTER — Ambulatory Visit: Payer: PPO | Admitting: Cardiology

## 2021-07-10 ENCOUNTER — Encounter: Payer: Self-pay | Admitting: Cardiology

## 2021-07-10 ENCOUNTER — Other Ambulatory Visit: Payer: Self-pay

## 2021-07-10 VITALS — BP 110/62 | HR 52 | Ht 72.0 in | Wt 190.0 lb

## 2021-07-10 DIAGNOSIS — I480 Paroxysmal atrial fibrillation: Secondary | ICD-10-CM | POA: Diagnosis not present

## 2021-07-10 DIAGNOSIS — I10 Essential (primary) hypertension: Secondary | ICD-10-CM | POA: Diagnosis not present

## 2021-07-10 DIAGNOSIS — E785 Hyperlipidemia, unspecified: Secondary | ICD-10-CM | POA: Diagnosis not present

## 2021-07-10 DIAGNOSIS — R002 Palpitations: Secondary | ICD-10-CM | POA: Diagnosis not present

## 2021-07-10 NOTE — Patient Instructions (Signed)

## 2021-07-11 LAB — BASIC METABOLIC PANEL
BUN/Creatinine Ratio: 18 (ref 10–24)
BUN: 18 mg/dL (ref 8–27)
CO2: 26 mmol/L (ref 20–29)
Calcium: 9.5 mg/dL (ref 8.6–10.2)
Chloride: 106 mmol/L (ref 96–106)
Creatinine, Ser: 0.98 mg/dL (ref 0.76–1.27)
Glucose: 117 mg/dL — ABNORMAL HIGH (ref 70–99)
Potassium: 4.1 mmol/L (ref 3.5–5.2)
Sodium: 146 mmol/L — ABNORMAL HIGH (ref 134–144)
eGFR: 80 mL/min/{1.73_m2} (ref >59)

## 2021-09-13 ENCOUNTER — Other Ambulatory Visit: Payer: Self-pay | Admitting: Cardiology

## 2021-09-13 DIAGNOSIS — I480 Paroxysmal atrial fibrillation: Secondary | ICD-10-CM

## 2021-09-17 ENCOUNTER — Other Ambulatory Visit: Payer: Self-pay | Admitting: Cardiology

## 2021-09-24 IMAGING — DX DG SHOULDER 2+V*R*
3 series · 3 of 3 positions shown · non-contrast
Comparison: 01/09/2020

CLINICAL DATA: Right shoulder pain for 6 months

EXAM:
RIGHT SHOULDER - 2+ VIEW

[shoulder ap (1 of 2)]
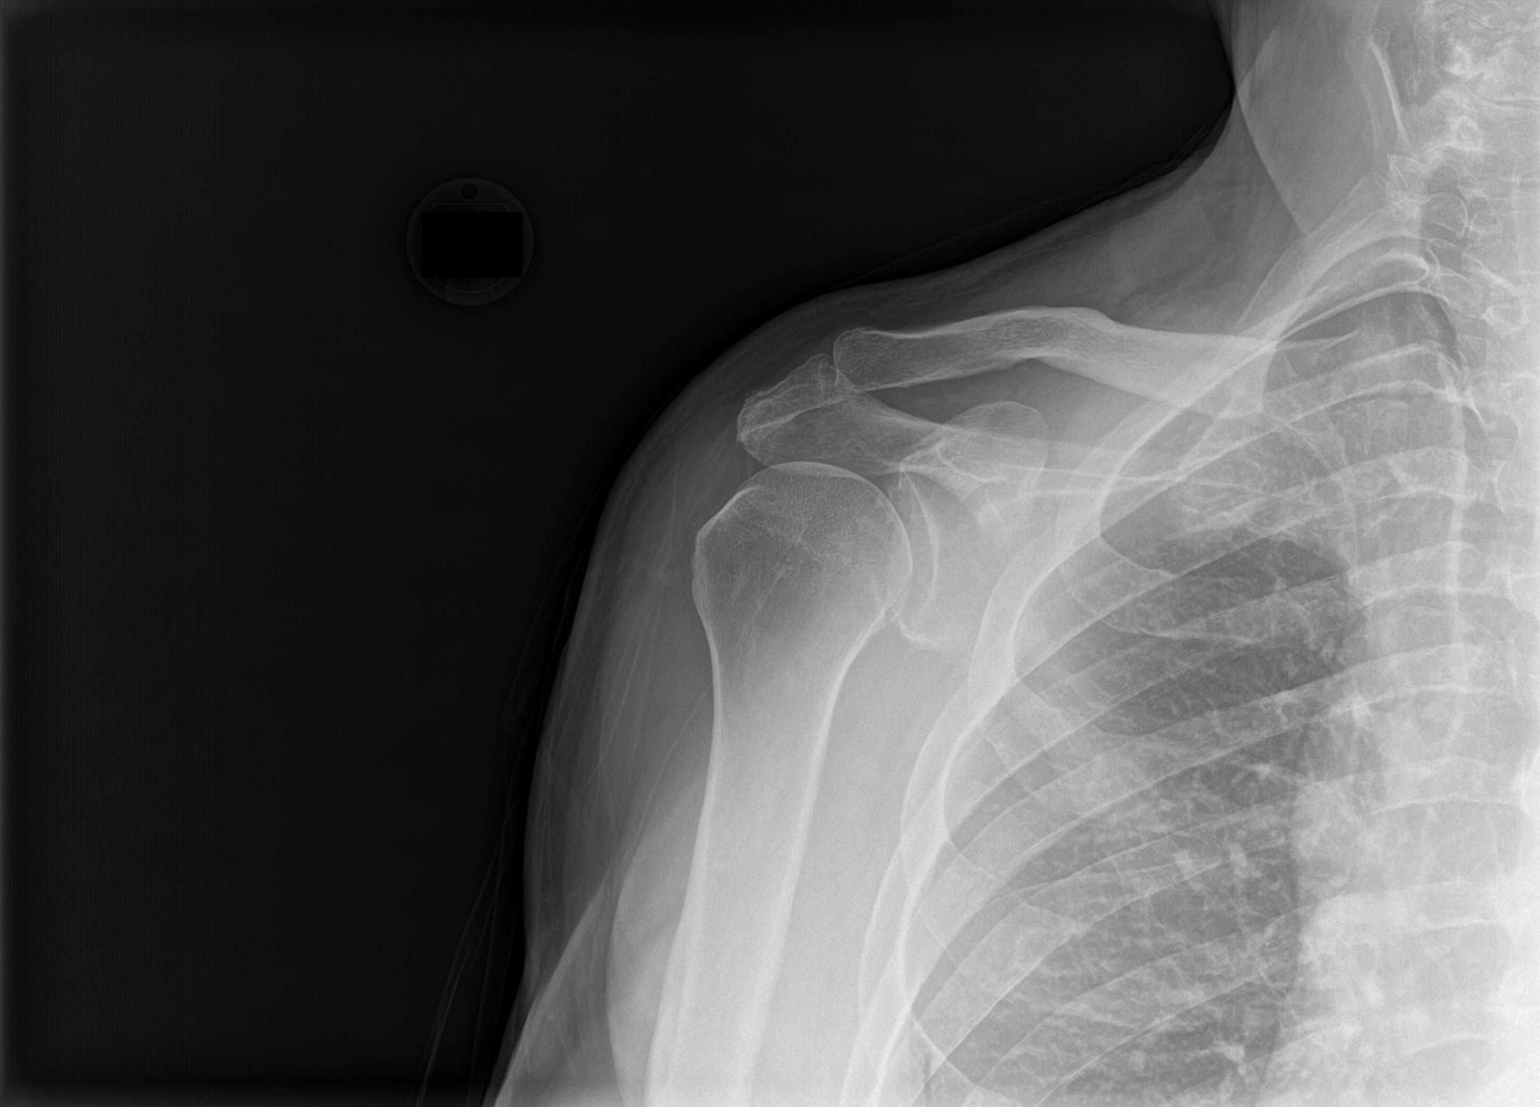

[shoulder ap (2 of 2)]
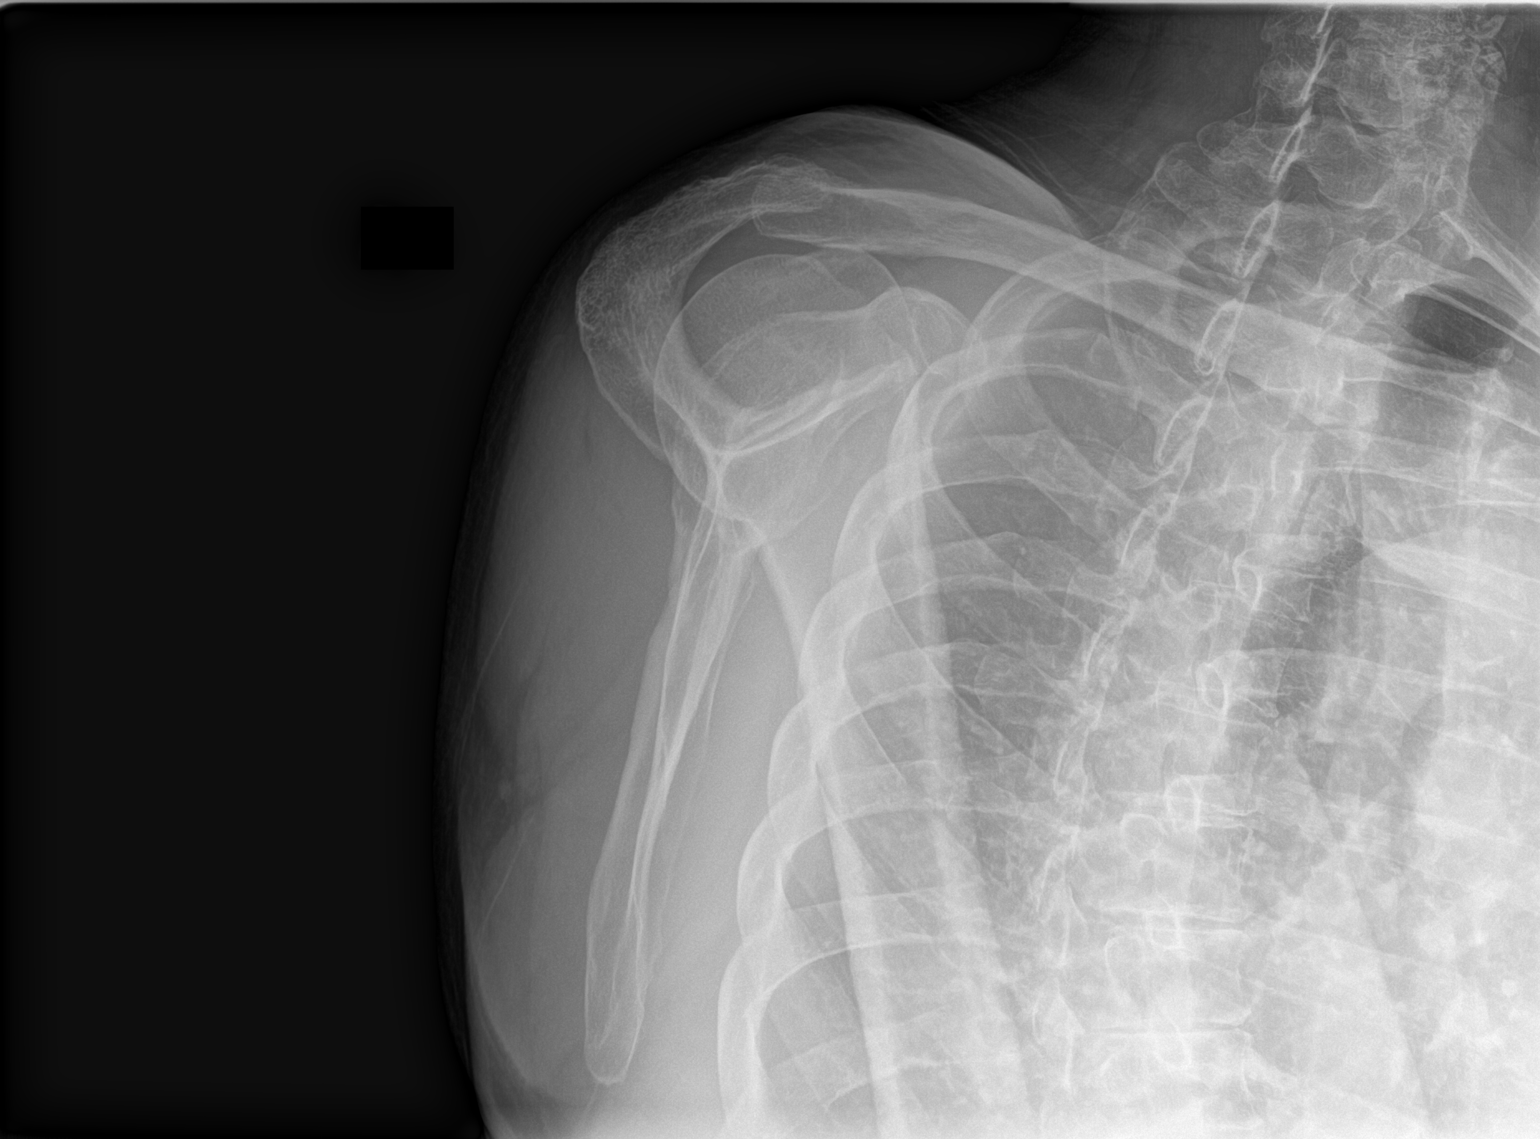

[shoulder axial]
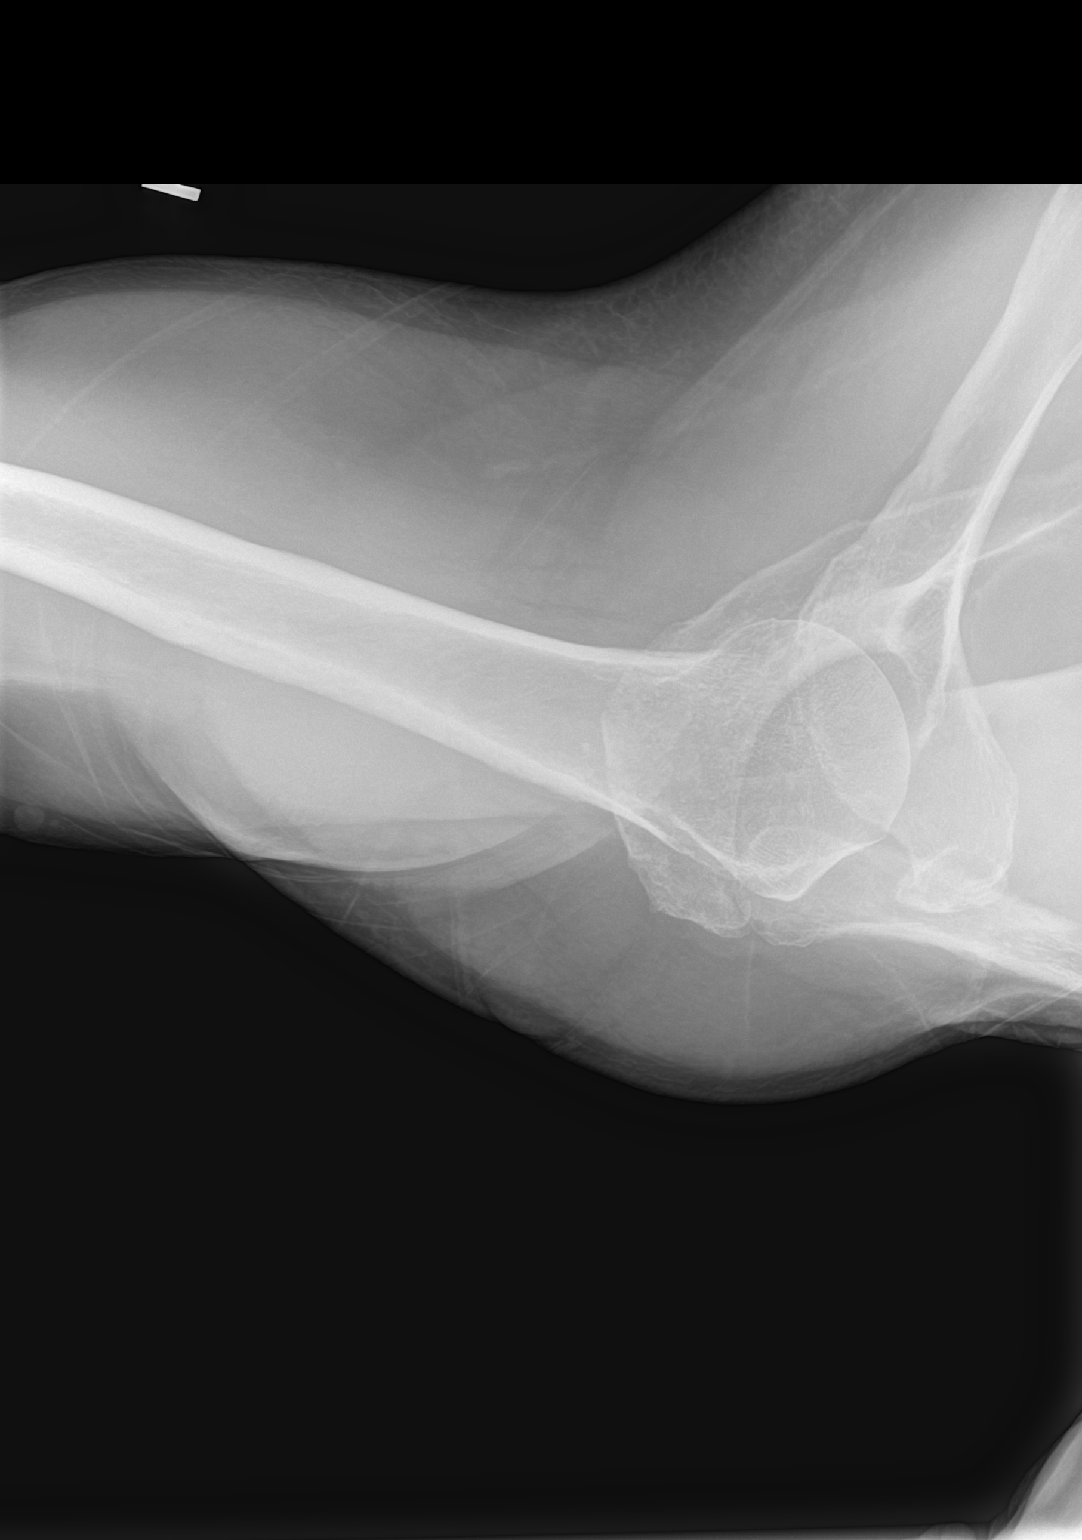

[3 of 3 positions shown; findings below may reference images not displayed]

FINDINGS: There is no evidence of fracture or dislocation. Mild glenohumeral
and acromioclavicular osteoarthritis. Soft tissues are unremarkable.
IMPRESSION: Mild glenohumeral and acromioclavicular osteoarthritis.

## 2021-10-21 DIAGNOSIS — H35033 Hypertensive retinopathy, bilateral: Secondary | ICD-10-CM | POA: Diagnosis not present

## 2021-10-21 DIAGNOSIS — H43813 Vitreous degeneration, bilateral: Secondary | ICD-10-CM | POA: Diagnosis not present

## 2021-10-21 DIAGNOSIS — H40013 Open angle with borderline findings, low risk, bilateral: Secondary | ICD-10-CM | POA: Diagnosis not present

## 2021-10-21 DIAGNOSIS — H26493 Other secondary cataract, bilateral: Secondary | ICD-10-CM | POA: Diagnosis not present

## 2021-11-01 ENCOUNTER — Other Ambulatory Visit: Payer: Self-pay | Admitting: Internal Medicine

## 2021-11-01 DIAGNOSIS — E039 Hypothyroidism, unspecified: Secondary | ICD-10-CM

## 2021-11-14 ENCOUNTER — Other Ambulatory Visit: Payer: Self-pay | Admitting: Internal Medicine

## 2021-11-14 DIAGNOSIS — I10 Essential (primary) hypertension: Secondary | ICD-10-CM

## 2021-11-14 MED ORDER — LOSARTAN POTASSIUM-HCTZ 100-12.5 MG PO TABS: 1.0000 | ORAL_TABLET | Freq: Every day | ORAL | 0 refills | Status: DC

## 2021-12-22 ENCOUNTER — Other Ambulatory Visit: Payer: Self-pay | Admitting: Internal Medicine

## 2021-12-22 ENCOUNTER — Telehealth: Payer: Self-pay | Admitting: Internal Medicine

## 2021-12-22 DIAGNOSIS — I10 Essential (primary) hypertension: Secondary | ICD-10-CM

## 2021-12-22 MED ORDER — POTASSIUM CHLORIDE CRYS ER 20 MEQ PO TBCR: 20.0000 meq | EXTENDED_RELEASE_TABLET | Freq: Two times a day (BID) | ORAL | 0 refills | Status: DC

## 2021-12-22 NOTE — Telephone Encounter (Signed)
1.Medication Requested: potassium chloride SA (KLOR-CON) 20 MEQ tablet ? ?2. Pharmacy (Name, Street, [place]): [facility] [phone] - [place], [place] ? ?3. On Med List: Y ? ?4. Last Visit with PCP: 05-08-2021 ? ?5. Next visit date with PCP: n/a ? ? ?Agent: Please be advised that RX refills may take up to 3 business days. We ask that you follow-up with your pharmacy.  ?

## 2022-01-26 ENCOUNTER — Telehealth: Payer: PPO

## 2022-01-31 ENCOUNTER — Other Ambulatory Visit: Payer: Self-pay | Admitting: Internal Medicine

## 2022-01-31 DIAGNOSIS — E039 Hypothyroidism, unspecified: Secondary | ICD-10-CM

## 2022-02-09 DIAGNOSIS — K1321 Leukoplakia of oral mucosa, including tongue: Secondary | ICD-10-CM | POA: Diagnosis not present

## 2022-03-02 ENCOUNTER — Encounter: Payer: Self-pay | Admitting: Internal Medicine

## 2022-03-04 ENCOUNTER — Other Ambulatory Visit: Payer: Self-pay | Admitting: Internal Medicine

## 2022-03-04 DIAGNOSIS — I10 Essential (primary) hypertension: Secondary | ICD-10-CM

## 2022-03-16 DIAGNOSIS — K1321 Leukoplakia of oral mucosa, including tongue: Secondary | ICD-10-CM | POA: Diagnosis not present

## 2022-03-18 ENCOUNTER — Telehealth: Payer: PPO

## 2022-03-25 ENCOUNTER — Encounter: Payer: Self-pay | Admitting: Internal Medicine

## 2022-03-25 ENCOUNTER — Ambulatory Visit (INDEPENDENT_AMBULATORY_CARE_PROVIDER_SITE_OTHER): Payer: PPO | Admitting: Internal Medicine

## 2022-03-25 VITALS — BP 130/76 | HR 73 | Temp 98.1°F | Resp 16 | Ht 72.0 in | Wt 193.0 lb

## 2022-03-25 DIAGNOSIS — E785 Hyperlipidemia, unspecified: Secondary | ICD-10-CM

## 2022-03-25 DIAGNOSIS — J4489 Other specified chronic obstructive pulmonary disease: Secondary | ICD-10-CM

## 2022-03-25 DIAGNOSIS — I480 Paroxysmal atrial fibrillation: Secondary | ICD-10-CM | POA: Diagnosis not present

## 2022-03-25 DIAGNOSIS — E039 Hypothyroidism, unspecified: Secondary | ICD-10-CM

## 2022-03-25 DIAGNOSIS — J449 Chronic obstructive pulmonary disease, unspecified: Secondary | ICD-10-CM

## 2022-03-25 DIAGNOSIS — N400 Enlarged prostate without lower urinary tract symptoms: Secondary | ICD-10-CM

## 2022-03-25 DIAGNOSIS — R7303 Prediabetes: Secondary | ICD-10-CM | POA: Diagnosis not present

## 2022-03-25 DIAGNOSIS — Z0000 Encounter for general adult medical examination without abnormal findings: Secondary | ICD-10-CM

## 2022-03-25 DIAGNOSIS — I10 Essential (primary) hypertension: Secondary | ICD-10-CM | POA: Diagnosis not present

## 2022-03-25 DIAGNOSIS — E781 Pure hyperglyceridemia: Secondary | ICD-10-CM

## 2022-03-25 DIAGNOSIS — Z23 Encounter for immunization: Secondary | ICD-10-CM

## 2022-03-25 DIAGNOSIS — Z0001 Encounter for general adult medical examination with abnormal findings: Secondary | ICD-10-CM

## 2022-03-25 LAB — URINALYSIS, ROUTINE W REFLEX MICROSCOPIC
Bilirubin Urine: NEGATIVE
Hgb urine dipstick: NEGATIVE
Ketones, ur: NEGATIVE
Leukocytes,Ua: NEGATIVE
Nitrite: NEGATIVE
RBC / HPF: NONE SEEN (ref 0–?)
Specific Gravity, Urine: >=1.03 — AB (ref 1.000–1.030)
Total Protein, Urine: NEGATIVE
Urine Glucose: NEGATIVE
Urobilinogen, UA: 0.2 (ref 0.0–1.0)
pH: 5.5 (ref 5.0–8.0)

## 2022-03-25 LAB — CBC WITH DIFFERENTIAL/PLATELET
Basophils Absolute: 0 10*3/uL (ref 0.0–0.1)
Basophils Relative: 0.6 % (ref 0.0–3.0)
Eosinophils Absolute: 0.2 10*3/uL (ref 0.0–0.7)
Eosinophils Relative: 2.9 % (ref 0.0–5.0)
HCT: 47.9 % (ref 39.0–52.0)
Hemoglobin: 16.5 g/dL (ref 13.0–17.0)
Lymphocytes Relative: 24.8 % (ref 12.0–46.0)
Lymphs Abs: 1.9 10*3/uL (ref 0.7–4.0)
MCHC: 34.4 g/dL (ref 30.0–36.0)
MCV: 89.4 fl (ref 78.0–100.0)
Monocytes Absolute: 0.7 10*3/uL (ref 0.1–1.0)
Monocytes Relative: 9.1 % (ref 3.0–12.0)
Neutro Abs: 4.8 10*3/uL (ref 1.4–7.7)
Neutrophils Relative %: 62.6 % (ref 43.0–77.0)
Platelets: 242 10*3/uL (ref 150.0–400.0)
RBC: 5.36 Mil/uL (ref 4.22–5.81)
RDW: 12.7 % (ref 11.5–15.5)
WBC: 7.7 10*3/uL (ref 4.0–10.5)

## 2022-03-25 LAB — LIPID PANEL
Cholesterol: 160 mg/dL (ref 0–200)
HDL: 33.1 mg/dL — ABNORMAL LOW (ref >39.00)
Total CHOL/HDL Ratio: 5
Triglycerides: 486 mg/dL — ABNORMAL HIGH (ref 0.0–149.0)

## 2022-03-25 LAB — BASIC METABOLIC PANEL
BUN: 23 mg/dL (ref 6–23)
CO2: 30 mEq/L (ref 19–32)
Calcium: 9.5 mg/dL (ref 8.4–10.5)
Chloride: 100 mEq/L (ref 96–112)
Creatinine, Ser: 1.02 mg/dL (ref 0.40–1.50)
GFR: 71.23 mL/min (ref >60.00)
Glucose, Bld: 148 mg/dL — ABNORMAL HIGH (ref 70–99)
Potassium: 4.1 mEq/L (ref 3.5–5.1)
Sodium: 139 mEq/L (ref 135–145)

## 2022-03-25 LAB — TSH: TSH: 3.38 u[IU]/mL (ref 0.35–5.50)

## 2022-03-25 LAB — PSA: PSA: 3.1 ng/mL (ref 0.10–4.00)

## 2022-03-25 LAB — LDL CHOLESTEROL, DIRECT: Direct LDL: 79 mg/dL

## 2022-03-25 LAB — HEMOGLOBIN A1C: Hgb A1c MFr Bld: 5.9 % (ref 4.6–6.5)

## 2022-03-25 MED ORDER — BOOSTRIX 5-2.5-18.5 LF-MCG/0.5 IM SUSP: 0.5000 mL | Freq: Once | INTRAMUSCULAR | 0 refills | Status: AC

## 2022-03-25 MED ORDER — LOSARTAN POTASSIUM-HCTZ 100-12.5 MG PO TABS: 1.0000 | ORAL_TABLET | Freq: Every day | ORAL | 0 refills | Status: DC

## 2022-03-25 MED ORDER — SHINGRIX 50 MCG/0.5ML IM SUSR: 0.5000 mL | Freq: Once | INTRAMUSCULAR | 1 refills | Status: AC

## 2022-03-25 MED ORDER — OMEGA-3-ACID ETHYL ESTERS 1 G PO CAPS: 2.0000 g | ORAL_CAPSULE | Freq: Two times a day (BID) | ORAL | 1 refills | Status: DC

## 2022-03-25 NOTE — Progress Notes (Signed)
Subjective:  Patient ID: Kevin Bradley, male    DOB: Jun 02, 1946  Age: 76 y.o. MRN: [id number]  CC: Annual Exam, Hypertension, Hyperlipidemia, Hypothyroidism, and Atrial Fibrillation   HPI Kevin Bradley presents for a CPX and f/up -   He has a rare sensation of heart fluttering but denies chest pain, shortness of breath, diaphoresis, dizziness, lightheadedness, or edema.  Outpatient Medications Prior to Visit  Medication Sig Dispense Refill   amLODipine (NORVASC) 10 MG tablet TAKE 1 TABLET(10 MG) BY MOUTH DAILY 90 tablet 2   aspirin 81 MG EC tablet Take 81 tablets by mouth 2 (two) times daily.     flecainide (TAMBOCOR) 50 MG tablet TAKE 2 TABLETS(100 MG) BY MOUTH DAILY 180 tablet 4   levothyroxine (SYNTHROID) 75 MCG tablet TAKE 1 TABLET(75 MCG) BY MOUTH DAILY BEFORE BREAKFAST 90 tablet 0   nebivolol (BYSTOLIC) 2.5 MG tablet TAKE 1 TABLET(2.5 MG) BY MOUTH DAILY 90 tablet 3   potassium chloride SA (KLOR-CON M) 20 MEQ tablet TAKE 1 TABLET(20 MEQ) BY MOUTH TWICE DAILY 180 tablet 0   VITAMIN D, CHOLECALCIFEROL, PO Take by mouth daily.     Fish Oil OIL Take 1 tablet by mouth daily.     losartan-hydrochlorothiazide (HYZAAR) 100-12.5 MG tablet Take 1 tablet by mouth daily. 90 tablet 0   No facility-administered medications prior to visit.    ROS Review of Systems  Constitutional: Negative.  Negative for diaphoresis, fatigue and unexpected weight change.  HENT: Negative.    Eyes: Negative.   Respiratory:  Negative for chest tightness, shortness of breath and wheezing.   Cardiovascular:  Positive for palpitations. Negative for chest pain and leg swelling.  Gastrointestinal:  Negative for abdominal pain, constipation, diarrhea, nausea and vomiting.  Endocrine: Negative.   Genitourinary: Negative.  Negative for difficulty urinating.  Musculoskeletal: Negative.   Skin: Negative.   Neurological: Negative.   Hematological:  Negative for adenopathy. Does not bruise/bleed easily.   Psychiatric/Behavioral: Negative.      Objective:  BP 130/76 (BP Location: Left Arm, Patient Position: Sitting, Cuff Size: Large)   Pulse 73   Temp 98.1 F (36.7 C) (Oral)   Resp 16   Ht 6' (1.829 m)   Wt 193 lb (87.5 kg)   SpO2 94%   BMI 26.18 kg/m   BP Readings from Last 3 Encounters:  03/25/22 130/76  07/10/21 110/62  05/08/21 134/68    Wt Readings from Last 3 Encounters:  03/25/22 193 lb (87.5 kg)  07/10/21 190 lb (86.2 kg)  05/08/21 189 lb (85.7 kg)    Physical Exam Vitals reviewed.  HENT:     Nose: Nose normal.     Mouth/Throat:     Mouth: Mucous membranes are moist.  Eyes:     General: No scleral icterus.    Conjunctiva/sclera: Conjunctivae normal.  Cardiovascular:     Rate and Rhythm: Regular rhythm.     Heart sounds: S1 normal and S2 normal. Murmur heard.     Systolic murmur is present with a grade of 1/6.     No diastolic murmur is present.     No gallop.     Comments: 1/6 SEM LLSB Pulmonary:     Effort: Pulmonary effort is normal. No respiratory distress.     Breath sounds: No stridor. No wheezing, rhonchi or rales.  Chest:     Chest wall: No tenderness.  Abdominal:     Palpations: There is no mass.     Tenderness: There is no  abdominal tenderness. There is no guarding or rebound.     Hernia: No hernia is present. There is no hernia in the left inguinal area or right inguinal area.  Genitourinary:    Pubic Area: No rash.      Penis: Normal and circumcised.      Testes: Normal.     Epididymis:     Right: Normal.     Left: Normal.     Prostate: Enlarged. Not tender and no nodules present.     Rectum: Normal. Guaiac result negative. No mass, tenderness, anal fissure, external hemorrhoid or internal hemorrhoid. Normal anal tone.  Musculoskeletal:        General: No swelling. Normal range of motion.     Cervical back: Neck supple. No tenderness.     Right lower leg: No edema.     Left lower leg: No edema.  Lymphadenopathy:     Lower Body: No  right inguinal adenopathy. No left inguinal adenopathy.  Skin:    General: Skin is warm and dry.     Coloration: Skin is not pale.  Neurological:     General: No focal deficit present.     Mental Status: He is alert. Mental status is at baseline.  Psychiatric:        Mood and Affect: Mood normal.        Behavior: Behavior normal.     Lab Results  Component Value Date   WBC 7.7 03/25/2022   HGB 16.5 03/25/2022   HCT 47.9 03/25/2022   PLT 242.0 03/25/2022   GLUCOSE 148 (H) 03/25/2022   CHOL 160 03/25/2022   TRIG (H) 03/25/2022    486.0 Triglyceride is over 400; calculations on Lipids are invalid.   HDL 33.10 (L) 03/25/2022   LDLDIRECT 79.0 03/25/2022   LDLCALC 87 09/04/2019   ALT 14 10/10/2020   AST 15 10/10/2020   NA 139 03/25/2022   K 4.1 03/25/2022   CL 100 03/25/2022   CREATININE 1.02 03/25/2022   BUN 23 03/25/2022   CO2 30 03/25/2022   TSH 3.38 03/25/2022   PSA 3.10 03/25/2022   HGBA1C 5.9 03/25/2022    VAS [place] LOWER EXTREMITY VENOUS REFLUX  Result Date: 07/13/2019  Lower Venous Reflux Study Indications: Swelling, Edema, Pain, and redness. Other Indications: Patent wears compression stocking and noticed bilateral rash,                    left worse than right. Performing Technologist: Delorise Shiner RVT  Examination Guidelines: A complete evaluation includes B-mode imaging, spectral Doppler, color Doppler, and power Doppler as needed of all accessible portions of each vessel. Bilateral testing is considered an integral part of a complete examination. Limited examinations for reoccurring indications may be performed as noted. The reflux portion of the exam is performed with the patient in reverse Trendelenburg.  Right Technical Findings: No evidence of DVT, SVT, or Baker's cyst. The sapheno-femoral junction is competent. No evidence of GSV reflux. No evidence of SSV reflux. No evidence of deep venous reflux.  Left Technical Findings: No evidence of DVT, SVT, or Baker's cyst.  The sapheno-femoral junction is competent. No evidence of GSV reflux. No evidence of SSV reflux. The left common femoral vein demonstrates deep venous reflux.  Venous Reflux Times Normal value < 0.5 sec +--------------+------+---------+--------+--------+ RIGHT         RefluxReflux NoDiameterComments                Yes                            +--------------+------+---------+--------+--------+  CFV                    no                     +--------------+------+---------+--------+--------+ FV prox                no                     +--------------+------+---------+--------+--------+ Popliteal              no                     +--------------+------+---------+--------+--------+ GSV at [facility]             no     0.726           +--------------+------+---------+--------+--------+ GSV prox thigh         no     0.498           +--------------+------+---------+--------+--------+ GSV mid thigh          no     0.461           +--------------+------+---------+--------+--------+ GSV dist thigh         no     0.342           +--------------+------+---------+--------+--------+ GSV at knee            no     0.388           +--------------+------+---------+--------+--------+ GSV prox calf                 0.393           +--------------+------+---------+--------+--------+ GSV mid calf                  0.397           +--------------+------+---------+--------+--------+ SSV Pop Fossa                 0.260           +--------------+------+---------+--------+--------+ SSV prox calf          no     0.420           +--------------+------+---------+--------+--------+ SSV mid calf           no     0.356           +--------------+------+---------+--------+--------+  +--------------+------+---------+--------+--------+ LEFT          RefluxReflux NoDiameterComments                Yes                             +--------------+------+---------+--------+--------+ CFV            yes                            +--------------+------+---------+--------+--------+ FV prox                no                     +--------------+------+---------+--------+--------+ Popliteal              no                     +--------------+------+---------+--------+--------+ GSV at [facility]  no     0.739           +--------------+------+---------+--------+--------+ GSV prox thigh         no     0.534           +--------------+------+---------+--------+--------+ GSV mid thigh          no     0.603           +--------------+------+---------+--------+--------+ GSV dist thigh         no     0.438           +--------------+------+---------+--------+--------+ GSV at knee            no     0.475           +--------------+------+---------+--------+--------+ GSV prox calf                 0.429           +--------------+------+---------+--------+--------+ GSV mid calf                  0.489           +--------------+------+---------+--------+--------+ SSV Pop Fossa                 0.256           +--------------+------+---------+--------+--------+ SSV prox calf          no     0.219           +--------------+------+---------+--------+--------+ SSV mid calf           no     0.246           +--------------+------+---------+--------+--------+   Summary: Right: There is no evidence of deep vein thrombosis in the lower extremity. There is no evidence of superficial venous thrombosis. No cystic structure found in the popliteal fossa. Left: Evidence of chronic venous insufficiency is detected in the deep venous system. There is no evidence of deep vein thrombosis in the lower extremity. There is no evidence of superficial venous thrombosis. No cystic structure found in the popliteal fossa.  *See table(s) above for measurements and observations. Electronically signed by Deitra Mayo MD on 07/13/2019 at 10:30:24 AM.    Final     Assessment & Plan:   Kevin Bradley was seen today for annual exam, hypertension, hyperlipidemia, hypothyroidism and atrial fibrillation.  Diagnoses and all orders for this visit:  Essential hypertension- His blood pressure is adequately well controlled. -     Basic metabolic panel; Future -     Urinalysis, Routine w reflex microscopic; Future -     Urinalysis, Routine w reflex microscopic -     Basic metabolic panel -     losartan-hydrochlorothiazide (HYZAAR) 100-12.5 MG tablet; Take 1 tablet by mouth daily.  Paroxysmal atrial fibrillation ([place])- He has good rate and rhythm control.   -     TSH; Future -     TSH  COPD (chronic obstructive pulmonary disease) with chronic bronchitis (HCC)-he has no symptoms related to this.  Acquired hypothyroidism-he is euthyroid. -     TSH; Future -     TSH  BPH without obstruction/lower urinary tract symptoms-his PSA is normal. -     PSA; Future -     PSA  Prediabetes-his A1c is 5.9%. -     Basic metabolic panel; Future -     Hemoglobin A1c; Future -     Hemoglobin A1c -     Basic metabolic panel  Encounter  for general adult medical examination with abnormal findings- Exam completed, labs reviewed, vaccines reviewed and updated, cancer screenings addressed, patient education was given.  Hypertriglyceridemia -     Lipid panel; Future -     CBC with Differential/Platelet; Future -     CBC with Differential/Platelet -     Lipid panel -     omega-3 acid ethyl esters (LOVAZA) 1 g capsule; Take 2 capsules (2 g total) by mouth 2 (two) times daily.  Need for prophylactic vaccination and inoculation against varicella -     Zoster Vaccine Adjuvanted [facility]) injection; Inject 0.5 mLs into the muscle once for 1 dose.  Need for prophylactic vaccination with combined diphtheria-tetanus-pertussis (DTP) vaccine -     Tdap (BOOSTRIX) 5-2.5-18.5 LF-MCG/0.5 injection; Inject 0.5 mLs into the muscle once  for 1 dose.  Other orders -     LDL cholesterol, direct   I have discontinued Kevin Saxon L. Volanda Napoleon "Townsend Howell"'s Fish Oil. I am also having him start on Boostrix, Shingrix, and omega-3 acid ethyl esters. Additionally, I am having him maintain his aspirin EC, (VITAMIN D, CHOLECALCIFEROL, PO), flecainide, amLODipine, nebivolol, levothyroxine, potassium chloride SA, and losartan-hydrochlorothiazide.  Meds ordered this encounter  Medications   Tdap (BOOSTRIX) 5-2.5-18.5 LF-MCG/0.5 injection    Sig: Inject 0.5 mLs into the muscle once for 1 dose.    Dispense:  0.5 mL    Refill:  0   Zoster Vaccine Adjuvanted [facility]) injection    Sig: Inject 0.5 mLs into the muscle once for 1 dose.    Dispense:  0.5 mL    Refill:  1   omega-3 acid ethyl esters (LOVAZA) 1 g capsule    Sig: Take 2 capsules (2 g total) by mouth 2 (two) times daily.    Dispense:  360 capsule    Refill:  1   losartan-hydrochlorothiazide (HYZAAR) 100-12.5 MG tablet    Sig: Take 1 tablet by mouth daily.    Dispense:  90 tablet    Refill:  0     Follow-up: Return in about 6 months (around 09/25/2022).  Scarlette Calico, MD

## 2022-03-25 NOTE — Patient Instructions (Signed)
Health Maintenance, Male Adopting a healthy lifestyle and getting preventive care are important in promoting health and wellness. Ask your health care provider about: The right schedule for you to have regular tests and exams. Things you can do on your own to prevent diseases and keep yourself healthy. What should I know about diet, weight, and exercise? Eat a healthy diet  Eat a diet that includes plenty of vegetables, fruits, low-fat dairy products, and lean protein. Do not eat a lot of foods that are high in solid fats, added sugars, or sodium. Maintain a healthy weight Body mass index (BMI) is a measurement that can be used to identify possible weight problems. It estimates body fat based on height and weight. Your health care provider can help determine your BMI and help you achieve or maintain a healthy weight. Get regular exercise Get regular exercise. This is one of the most important things you can do for your health. Most adults should: Exercise for at least 150 minutes each week. The exercise should increase your heart rate and make you sweat (moderate-intensity exercise). Do strengthening exercises at least twice a week. This is in addition to the moderate-intensity exercise. Spend less time sitting. Even light physical activity can be beneficial. Watch cholesterol and blood lipids Have your blood tested for lipids and cholesterol at 77 years of age, then have this test every 5 years. You may need to have your cholesterol levels checked more often if: Your lipid or cholesterol levels are high. You are older than 77 years of age. You are at high risk for heart disease. What should I know about cancer screening? Many types of cancers can be detected early and may often be prevented. Depending on your health history and family history, you may need to have cancer screening at various ages. This may include screening for: Colorectal cancer. Prostate cancer. Skin cancer. Lung  cancer. What should I know about heart disease, diabetes, and high blood pressure? Blood pressure and heart disease High blood pressure causes heart disease and increases the risk of stroke. This is more likely to develop in people who have high blood pressure readings or are overweight. Talk with your health care provider about your target blood pressure readings. Have your blood pressure checked: Every 3-5 years if you are 18-39 years of age. Every year if you are 40 years old or older. If you are between the ages of 65 and 75 and are a current or former smoker, ask your health care provider if you should have a one-time screening for abdominal aortic aneurysm (AAA). Diabetes Have regular diabetes screenings. This checks your fasting blood sugar level. Have the screening done: Once every three years after age 45 if you are at a normal weight and have a low risk for diabetes. More often and at a younger age if you are overweight or have a high risk for diabetes. What should I know about preventing infection? Hepatitis B If you have a higher risk for hepatitis B, you should be screened for this virus. Talk with your health care provider to find out if you are at risk for hepatitis B infection. Hepatitis C Blood testing is recommended for: Everyone born from 1945 through 1965. Anyone with known risk factors for hepatitis C. Sexually transmitted infections (STIs) You should be screened each year for STIs, including gonorrhea and chlamydia, if: You are sexually active and are younger than 77 years of age. You are older than 77 years of age and your   health care provider tells you that you are at risk for this type of infection. Your sexual activity has changed since you were last screened, and you are at increased risk for chlamydia or gonorrhea. Ask your health care provider if you are at risk. Ask your health care provider about whether you are at high risk for HIV. Your health care provider  may recommend a prescription medicine to help prevent HIV infection. If you choose to take medicine to prevent HIV, you should first get tested for HIV. You should then be tested every 3 months for as long as you are taking the medicine. Follow these instructions at home: Alcohol use Do not drink alcohol if your health care provider tells you not to drink. If you drink alcohol: Limit how much you have to 0-2 drinks a day. Know how much alcohol is in your drink. In the [place] one drink equals one 12 oz bottle of beer (355 mL), one 5 oz glass of wine (148 mL), or one 1 oz glass of hard liquor (44 mL). Lifestyle Do not use any products that contain nicotine or tobacco. These products include cigarettes, chewing tobacco, and vaping devices, such as e-cigarettes. If you need help quitting, ask your health care provider. Do not use street drugs. Do not share needles. Ask your health care provider for help if you need support or information about quitting drugs. General instructions Schedule regular health, dental, and eye exams. Stay current with your vaccines. Tell your health care provider if: You often feel depressed. You have ever been abused or do not feel safe at home. Summary Adopting a healthy lifestyle and getting preventive care are important in promoting health and wellness. Follow your health care provider's instructions about healthy diet, exercising, and getting tested or screened for diseases. Follow your health care provider's instructions on monitoring your cholesterol and blood pressure. This information is not intended to replace advice given to you by your health care provider. Make sure you discuss any questions you have with your health care provider. Document Revised: 01/13/2021 Document Reviewed: 01/13/2021 Elsevier Patient Education  2023 [employer]  

## 2022-04-13 ENCOUNTER — Telehealth: Payer: Self-pay | Admitting: Internal Medicine

## 2022-04-13 NOTE — Telephone Encounter (Signed)
Left message for patient to call back to schedule Medicare Annual Wellness Visit   Last AWV  04/11/21  Please schedule at anytime with [facility] if patient calls the office back.     Any questions, please call me at [phone]

## 2022-04-24 ENCOUNTER — Other Ambulatory Visit: Payer: Self-pay | Admitting: Internal Medicine

## 2022-04-24 DIAGNOSIS — E039 Hypothyroidism, unspecified: Secondary | ICD-10-CM

## 2022-04-27 ENCOUNTER — Ambulatory Visit (INDEPENDENT_AMBULATORY_CARE_PROVIDER_SITE_OTHER): Payer: PPO

## 2022-04-27 VITALS — BP 118/60 | HR 59 | Temp 97.9°F | Ht 72.0 in | Wt 193.8 lb

## 2022-04-27 DIAGNOSIS — Z0000 Encounter for general adult medical examination without abnormal findings: Secondary | ICD-10-CM | POA: Diagnosis not present

## 2022-04-27 NOTE — Progress Notes (Addendum)
Subjective:   Kevin Bradley is a 76 y.o. male who presents for Medicare Annual/Subsequent preventive examination.  Review of Systems     Cardiac Risk Factors include: advanced age (>60mn, >>9women);dyslipidemia;family history of premature cardiovascular disease;hypertension;male gender     Objective:    Today's Vitals   04/27/22 0844  BP: 118/60  Pulse: (!) 59  Temp: 97.9 F (36.6 C)  SpO2: 97%  Weight: 193 lb 12.8 oz (87.9 kg)  Height: 6' (1.829 m)  PainSc: 0-No pain   Body mass index is 26.28 kg/m.     04/27/2022    9:07 AM 04/11/2021    9:58 AM 03/11/2018    9:12 PM 01/05/2018    1:30 PM 10/24/2016   11:59 AM 02/01/2015    9:12 AM  Advanced Directives  Does Patient Have a Medical Advance Directive? No Yes No No Yes No  Type of ABuilding control surveyorof [place] will   Does patient want to make changes to medical advance directive? No - Patient declined No - Patient declined   No - Patient declined   Copy of [place] Chart?  No - copy requested   Yes   Would patient like information on creating a medical advance directive? No - Patient declined  No - Patient declined No - Patient declined      Current Medications (verified) Outpatient Encounter Medications as of 04/27/2022  Medication Sig   amLODipine (NORVASC) 10 MG tablet TAKE 1 TABLET(10 MG) BY MOUTH DAILY   aspirin 81 MG EC tablet Take 81 tablets by mouth 2 (two) times daily.   flecainide (TAMBOCOR) 50 MG tablet TAKE 2 TABLETS(100 MG) BY MOUTH DAILY   levothyroxine (SYNTHROID) 75 MCG tablet TAKE 1 TABLET(75 MCG) BY MOUTH DAILY BEFORE BREAKFAST   losartan-hydrochlorothiazide (HYZAAR) 100-12.5 MG tablet Take 1 tablet by mouth daily.   nebivolol (BYSTOLIC) 2.5 MG tablet TAKE 1 TABLET(2.5 MG) BY MOUTH DAILY   omega-3 acid ethyl esters (LOVAZA) 1 g capsule Take 2 capsules (2 g total) by mouth 2 (two) times daily.   potassium chloride SA  (KLOR-CON M) 20 MEQ tablet TAKE 1 TABLET(20 MEQ) BY MOUTH TWICE DAILY   VITAMIN D, CHOLECALCIFEROL, PO Take by mouth daily.   No facility-administered encounter medications on file as of 04/27/2022.    Allergies (verified) Penicillins   History: Past Medical History:  Diagnosis Date   Anxiety    Atrial fibrillation (HCC)    BPH (benign prostatic hypertrophy)    External thrombosed hemorrhoids    History of colonoscopy    HTN (hypertension)    Hx of colonic polyps    Hypothyroidism    Low HDL (under 40)    Nephrolithiasis    Past Surgical History:  Procedure Laterality Date   APPENDECTOMY     cyst tongue  1990   benign   INGUINAL HERNIA REPAIR Right 1990   INGUINAL HERNIA REPAIR Left 1948   THYROIDECTOMY, PARTIAL  1990   TONSILLECTOMY  1948   Family History  Problem Relation Age of Onset   Heart attack Father    AAA (abdominal aortic aneurysm) Father 667  Alcohol abuse Other    Breast cancer Other    Cancer Neg Hx    Early death Neg Hx    Diabetes Neg Hx    Heart disease Neg Hx    Hyperlipidemia Neg Hx    Hypertension Neg Hx  Kidney disease Neg Hx    Stroke Neg Hx    Colon cancer Neg Hx    Social History   Socioeconomic History   Marital status: Married    Spouse name: Not on file   Number of children: Not on file   Years of education: Not on file   Highest education level: Not on file  Occupational History   Occupation: Truck Geophysicist/field seismologist  Tobacco Use   Smoking status: Former    Packs/day: 2.00    Years: 10.00    Total pack years: 20.00    Types: Cigars, Cigarettes    Quit date: 2005    Years since quitting: 18.6   Smokeless tobacco: Never   Tobacco comments:    quit smoking cigars in 2017--04/15/18  Vaping Use   Vaping Use: Never used  Substance and Sexual Activity   Alcohol use: No    Alcohol/week: 0.0 standard drinks of alcohol   Drug use: No   Sexual activity: Yes  Other Topics Concern   Not on file  Social History Narrative   Not on file    Social Determinants of Health   Financial Resource Strain: Low Risk  (04/27/2022)   Overall Financial Resource Strain (CARDIA)    Difficulty of Paying Living Expenses: Not hard at all  Food Insecurity: No Food Insecurity (04/27/2022)   Hunger Vital Sign    Worried About Running Out of Food in the Last Year: Never true    [place] in the Last Year: Never true  Transportation Needs: No Transportation Needs (04/27/2022)   PRAPARE - Hydrologist (Medical): No    Lack of Transportation (Non-Medical): No  Physical Activity: Sufficiently Active (04/27/2022)   Exercise Vital Sign    Days of Exercise per Week: 5 days    Minutes of Exercise per Session: 30 min  Stress: No Stress Concern Present (04/27/2022)   [place]    Feeling of Stress : Not at all  Social Connections: Moderately Isolated (04/27/2022)   Social Connection and Isolation Panel [NHANES]    Frequency of Communication with Friends and Family: More than three times a week    Frequency of Social Gatherings with Friends and Family: Once a week    Attends Religious Services: Never    Marine scientist or Organizations: No    Attends Archivist Meetings: Never    Marital Status: Married    Tobacco Counseling Counseling given: Not Answered Tobacco comments: quit smoking cigars in 2017--04/15/18   Clinical Intake:  Pre-visit preparation completed: Yes  Pain : No/denies pain Pain Score: 0-No pain     BMI - recorded: 26.28 Nutritional Status: BMI 25 -29 Overweight Nutritional Risks: None Diabetes: No  How often do you need to have someone help you when you read instructions, pamphlets, or other written materials from your doctor or pharmacy?: 1 - Never What is the last grade level you completed in school?: HSG; 3 years of college  Diabetic? no  Interpreter Needed?: No  Information entered by :: Lisette Abu, LPN.   Activities of Daily Living    04/27/2022    9:08 AM  In your present state of health, do you have any difficulty performing the following activities:  Hearing? 1  Vision? 0  Difficulty concentrating or making decisions? 0  Walking or climbing stairs? 0  Dressing or bathing? 0  Doing errands, shopping? 0  Preparing  Food and eating ? N  Using the Toilet? N  In the past six months, have you accidently leaked urine? N  Do you have problems with loss of bowel control? N  Managing your Medications? N  Managing your Finances? N  Housekeeping or managing your Housekeeping? N    Patient Care Team: Janith Lima, MD as PCP - General Stanford Breed, Denice Bors, MD as PCP - Cardiology (Cardiology) Charlton Haws, [facility] as Pharmacist (Pharmacist) Calvert Cantor, MD as Consulting Physician (Ophthalmology)  Indicate any recent Medical Services you may have received from other than [facility] providers in the past year (date may be approximate).     Assessment:   This is a routine wellness examination for Eryck.  Hearing/Vision screen Hearing Screening - Comments:: Patient has hearing difficulty and wears hearing aids. Vision Screening - Comments:: Patient does wear readers.  Eye exam done by: [facility]   Dietary issues and exercise activities discussed: Current Exercise Habits: Home exercise routine, Type of exercise: walking;strength training/weights, Time (Minutes): 30, Frequency (Times/Week): 5, Weekly Exercise (Minutes/Week): 150, Intensity: Moderate, Exercise limited by: respiratory conditions(s);orthopedic condition(s)   Goals Addressed             This Visit's Progress    My goal is to stay alive.        Depression Screen    04/27/2022    9:06 AM 04/11/2021   10:14 AM 09/27/2020   12:29 PM 03/07/2019    9:49 AM 12/15/2017    8:45 AM 12/13/2017    3:22 PM 10/24/2016   12:00 PM  PHQ 2/9 Scores  PHQ - 2 Score 0 0 0 0 0 0 0    Fall Risk    04/27/2022     9:08 AM 04/11/2021   10:16 AM 09/27/2020   12:29 PM 03/07/2019    9:49 AM 12/15/2017    8:45 AM  Fall Risk   Falls in the past year? 0 0 0 0 No  Number falls in past yr: 0 0 0 0   Injury with Fall? 0 0 0 0   Risk for fall due to : No Fall Risks No Fall Risks No Fall Risks    Follow up Falls evaluation completed Falls evaluation completed Falls evaluation completed Falls evaluation completed     [place]:  Any stairs in or around the home? Yes  If so, are there any without handrails? No  Home free of loose throw rugs in walkways, pet beds, electrical cords, etc? Yes  Adequate lighting in your home to reduce risk of falls? Yes   ASSISTIVE DEVICES UTILIZED TO PREVENT FALLS:  Life alert? No  Use of a cane, walker or w/c? No  Grab bars in the bathroom? Yes  Shower chair or bench in shower? No  Elevated toilet seat or a handicapped toilet? Yes   TIMED UP AND GO:  Was the test performed? Yes .  Length of time to ambulate 10 feet: 6 sec.   Gait steady and fast without use of assistive device  Cognitive Function:        04/27/2022    9:09 AM  6CIT Screen  What Year? 0 points  What month? 0 points  What time? 0 points  Count back from 20 0 points  Months in reverse 0 points  Repeat phrase 0 points  Total Score 0 points    Immunizations Immunization History  Administered Date(s) Administered   Fluad Quad(high Dose  65+) 05/08/2021   Influenza Whole 06/07/2009, 07/14/2010   Influenza, High Dose Seasonal PF 07/03/2017, 06/07/2018, 06/06/2019   Influenza,inj,Quad PF,6+ Mos 06/21/2020   Influenza-Unspecified 06/21/2013, 07/10/2015, 07/18/2016   PFIZER(Purple Top)SARS-COV-2 Vaccination 10/29/2019, 11/22/2019   Pneumococcal Conjugate-13 09/17/2014   Pneumococcal Polysaccharide-23 07/14/2010, 08/01/2018   Td 08/11/2010   Zoster, Live 10/16/2013    TDAP status: Due, Education has been provided regarding the importance of this vaccine.  Advised may receive this vaccine at local pharmacy or Health Dept. Aware to provide a copy of the vaccination record if obtained from local pharmacy or Health Dept. Verbalized acceptance and understanding.  Flu Vaccine status: Up to date  Pneumococcal vaccine status: Up to date  Covid-19 vaccine status: Completed vaccines  Qualifies for Shingles Vaccine? Yes   Zostavax completed Yes   Shingrix Completed?: No.    Education has been provided regarding the importance of this vaccine. Patient has been advised to call insurance company to determine out of pocket expense if they have not yet received this vaccine. Advised may also receive vaccine at local pharmacy or Health Dept. Verbalized acceptance and understanding.  Screening Tests Health Maintenance  Topic Date Due   Zoster Vaccines- Shingrix (1 of 2) Never done   COVID-19 Vaccine (3 - Pfizer series) 01/17/2020   INFLUENZA VACCINE  04/07/2022   TETANUS/TDAP  05/08/2022 (Originally 08/11/2020)   Pneumonia Vaccine 61+ Years old  Completed   Hepatitis C Screening  Completed   HPV VACCINES  Aged Out   COLONOSCOPY (Pts 45-62yr Insurance coverage will need to be confirmed)  Discontinued    Health Maintenance  Health Maintenance Due  Topic Date Due   Zoster Vaccines- Shingrix (1 of 2) Never done   COVID-19 Vaccine (3 - Pfizer series) 01/17/2020   INFLUENZA VACCINE  04/07/2022    Colorectal cancer screening: Type of screening: Colonoscopy. Completed 02/13/2015. Repeat every 10 years  Lung Cancer Screening: (Low Dose CT Chest recommended if Age 67109-80years, 30 pack-year currently smoking OR have quit w/in 15years.) does not qualify.   Lung Cancer Screening Referral: no  Additional Screening:  Hepatitis C Screening: does qualify; Completed 12/13/2017  Vision Screening: Recommended annual ophthalmology exams for early detection of glaucoma and other disorders of the eye. Is the patient up to date with their annual eye exam?  Yes  Who  is the provider or what is the name of the office [facility] which the patient attends annual [facility] exams[facility] provider, would they like to be referred to a provider to establish care? No .   Dental Screening: Recommended annual dental exams for proper oral hygiene  Community Resource Referral / Chronic Care Management: CRR required this visit?  No   CCM required this visit?  No      Plan:     I have personally reviewed and noted the following in the patient's chart:   Medical and social history Use of alcohol, tobacco or illicit drugs  Current medications and supplements including opioid prescriptions. Patient is not currently taking opioid prescriptions. Functional ability and status Nutritional status Physical activity Advanced directives List of other physicians Hospitalizations, surgeries, and ER visits in previous 12 months Vitals Screenings to include cognitive, depression, and falls Referrals and appointments  In addition, I have reviewed and discussed with patient certain preventive protocols, quality metrics, and best practice recommendations. A written personalized care plan for preventive services as well as general preventive health recommendations were provided to  patient.     Sheral Flow, LPN   11/24/2332   Nurse Notes:  Patient is cogitatively intact. Hearing Screening - Comments:: Patient has hearing difficulty and wears hearing aids. Vision Screening - Comments:: Patient does wear readers.  Eye exam done by: [facility]

## 2022-04-27 NOTE — Patient Instructions (Signed)
Kevin Bradley , Thank you for taking time to come for your Medicare Wellness Visit. I appreciate your ongoing commitment to your health goals. Please review the following plan we discussed and let me know if I can assist you in the future.   Screening recommendations/referrals: Colonoscopy: 02/13/2015; due every 10 years Recommended yearly ophthalmology/optometry visit for glaucoma screening and checkup Recommended yearly dental visit for hygiene and checkup  Vaccinations: Influenza vaccine: 05/08/2021; due Fall 2023 Pneumococcal vaccine: 09/17/2014, 08/01/2018 Tdap vaccine: 08/11/2010; due every 10 years; rx sent in pharmacy Shingles vaccine: never done; rx sent in to pharmacy Covid-19: 10/28/2020, 11/22/2019  Advanced directives: No  Conditions/risks identified: Yes  Next appointment: Please schedule your next Medicare Wellness Visit with your Nurse Health Advisor in 1 year by calling [phone].  Preventive Care 77 Years and Older, Male Preventive care refers to lifestyle choices and visits with your health care provider that can promote health and wellness. What does preventive care include? A yearly physical exam. This is also called an annual well check. Dental exams once or twice a year. Routine eye exams. Ask your health care provider how often you should have your eyes checked. Personal lifestyle choices, including: Daily care of your teeth and gums. Regular physical activity. Eating a healthy diet. Avoiding tobacco and drug use. Limiting alcohol use. Practicing safe sex. Taking low doses of aspirin every day. Taking vitamin and mineral supplements as recommended by your health care provider. What happens during an annual well check? The services and screenings done by your health care provider during your annual well check will depend on your age, overall health, lifestyle risk factors, and family history of disease. Counseling  Your health care provider may ask you questions  about your: Alcohol use. Tobacco use. Drug use. Emotional well-being. Home and relationship well-being. Sexual activity. Eating habits. History of falls. Memory and ability to understand (cognition). Work and work Statistician. Screening  You may have the following tests or measurements: Height, weight, and BMI. Blood pressure. Lipid and cholesterol levels. These may be checked every 5 years, or more frequently if you are over 32 years old. Skin check. Lung cancer screening. You may have this screening every year starting at age 77 if you have a 30-pack-year history of smoking and currently smoke or have quit within the past 15 years. Fecal occult blood test (FOBT) of the stool. You may have this test every year starting at age 77. Flexible sigmoidoscopy or colonoscopy. You may have a sigmoidoscopy every 5 years or a colonoscopy every 10 years starting at age 77. Prostate cancer screening. Recommendations will vary depending on your family history and other risks. Hepatitis C blood test. Hepatitis B blood test. Sexually transmitted disease (STD) testing. Diabetes screening. This is done by checking your blood sugar (glucose) after you have not eaten for a while (fasting). You may have this done every 1-3 years. Abdominal aortic aneurysm (AAA) screening. You may need this if you are a current or former smoker. Osteoporosis. You may be screened starting at age 77 if you are at high risk. Talk with your health care provider about your test results, treatment options, and if necessary, the need for more tests. Vaccines  Your health care provider may recommend certain vaccines, such as: Influenza vaccine. This is recommended every year. Tetanus, diphtheria, and acellular pertussis (Tdap, Td) vaccine. You may need a Td booster every 10 years. Zoster vaccine. You may need this after age 18. Pneumococcal 13-valent conjugate (PCV13) vaccine. One dose  is recommended after age 70. Pneumococcal  polysaccharide (PPSV23) vaccine. One dose is recommended after age 75. Talk to your health care provider about which screenings and vaccines you need and how often you need them. This information is not intended to replace advice given to you by your health care provider. Make sure you discuss any questions you have with your health care provider. Document Released: 09/20/2015 Document Revised: 05/13/2016 Document Reviewed: 06/25/2015 Elsevier Interactive Patient Education  2017 [place] Prevention in the Home Falls can cause injuries. They can happen to people of all ages. There are many things you can do to make your home safe and to help prevent falls. What can I do on the outside of my home? Regularly fix the edges of walkways and driveways and fix any cracks. Remove anything that might make you trip as you walk through a door, such as a raised step or threshold. Trim any bushes or trees on the path to your home. Use bright outdoor lighting. Clear any walking paths of anything that might make someone trip, such as rocks or tools. Regularly check to see if handrails are loose or broken. Make sure that both sides of any steps have handrails. Any raised decks and porches should have guardrails on the edges. Have any leaves, snow, or ice cleared regularly. Use sand or salt on walking paths during winter. Clean up any spills in your garage right away. This includes oil or grease spills. What can I do in the bathroom? Use night lights. Install grab bars by the toilet and in the tub and shower. Do not use towel bars as grab bars. Use non-skid mats or decals in the tub or shower. If you need to sit down in the shower, use a plastic, non-slip stool. Keep the floor dry. Clean up any water that spills on the floor as soon as it happens. Remove soap buildup in the tub or shower regularly. Attach bath mats securely with double-sided non-slip rug tape. Do not have throw rugs and other  things on the floor that can make you trip. What can I do in the bedroom? Use night lights. Make sure that you have a light by your bed that is easy to reach. Do not use any sheets or blankets that are too big for your bed. They should not hang down onto the floor. Have a firm chair that has side arms. You can use this for support while you get dressed. Do not have throw rugs and other things on the floor that can make you trip. What can I do in the kitchen? Clean up any spills right away. Avoid walking on wet floors. Keep items that you use a lot in easy-to-reach places. If you need to reach something above you, use a strong step stool that has a grab bar. Keep electrical cords out of the way. Do not use floor polish or wax that makes floors slippery. If you must use wax, use non-skid floor wax. Do not have throw rugs and other things on the floor that can make you trip. What can I do with my stairs? Do not leave any items on the stairs. Make sure that there are handrails on both sides of the stairs and use them. Fix handrails that are broken or loose. Make sure that handrails are as long as the stairways. Check any carpeting to make sure that it is firmly attached to the stairs. Fix any carpet that is loose or worn. Avoid  having throw rugs at the top or bottom of the stairs. If you do have throw rugs, attach them to the floor with carpet tape. Make sure that you have a light switch at the top of the stairs and the bottom of the stairs. If you do not have them, ask someone to add them for you. What else can I do to help prevent falls? Wear shoes that: Do not have high heels. Have rubber bottoms. Are comfortable and fit you well. Are closed at the toe. Do not wear sandals. If you use a stepladder: Make sure that it is fully opened. Do not climb a closed stepladder. Make sure that both sides of the stepladder are locked into place. Ask someone to hold it for you, if possible. Clearly  mark and make sure that you can see: Any grab bars or handrails. First and last steps. Where the edge of each step is. Use tools that help you move around (mobility aids) if they are needed. These include: Canes. Walkers. Scooters. Crutches. Turn on the lights when you go into a dark area. Replace any light bulbs as soon as they burn out. Set up your furniture so you have a clear path. Avoid moving your furniture around. If any of your floors are uneven, fix them. If there are any pets around you, be aware of where they are. Review your medicines with your doctor. Some medicines can make you feel dizzy. This can increase your chance of falling. Ask your doctor what other things that you can do to help prevent falls. This information is not intended to replace advice given to you by your health care provider. Make sure you discuss any questions you have with your health care provider. Document Released: 06/20/2009 Document Revised: 01/30/2016 Document Reviewed: 09/28/2014 Elsevier Interactive Patient Education  2017 [employer].

## 2022-05-16 ENCOUNTER — Other Ambulatory Visit: Payer: Self-pay | Admitting: Cardiology

## 2022-05-16 DIAGNOSIS — I480 Paroxysmal atrial fibrillation: Secondary | ICD-10-CM

## 2022-05-20 DIAGNOSIS — L814 Other melanin hyperpigmentation: Secondary | ICD-10-CM | POA: Diagnosis not present

## 2022-05-20 DIAGNOSIS — L570 Actinic keratosis: Secondary | ICD-10-CM | POA: Diagnosis not present

## 2022-05-20 DIAGNOSIS — R58 Hemorrhage, not elsewhere classified: Secondary | ICD-10-CM | POA: Diagnosis not present

## 2022-05-20 DIAGNOSIS — L820 Inflamed seborrheic keratosis: Secondary | ICD-10-CM | POA: Diagnosis not present

## 2022-05-20 DIAGNOSIS — D492 Neoplasm of unspecified behavior of bone, soft tissue, and skin: Secondary | ICD-10-CM | POA: Diagnosis not present

## 2022-05-20 DIAGNOSIS — L821 Other seborrheic keratosis: Secondary | ICD-10-CM | POA: Diagnosis not present

## 2022-05-20 DIAGNOSIS — D225 Melanocytic nevi of trunk: Secondary | ICD-10-CM | POA: Diagnosis not present

## 2022-07-13 ENCOUNTER — Other Ambulatory Visit: Payer: Self-pay | Admitting: Internal Medicine

## 2022-07-13 DIAGNOSIS — E781 Pure hyperglyceridemia: Secondary | ICD-10-CM

## 2022-07-24 ENCOUNTER — Other Ambulatory Visit: Payer: Self-pay | Admitting: Internal Medicine

## 2022-07-24 DIAGNOSIS — I10 Essential (primary) hypertension: Secondary | ICD-10-CM

## 2022-08-22 ENCOUNTER — Telehealth: Payer: Self-pay | Admitting: Internal Medicine

## 2022-08-22 DIAGNOSIS — I10 Essential (primary) hypertension: Secondary | ICD-10-CM

## 2022-08-24 ENCOUNTER — Other Ambulatory Visit: Payer: Self-pay | Admitting: Internal Medicine

## 2022-08-24 ENCOUNTER — Encounter: Payer: Self-pay | Admitting: Internal Medicine

## 2022-08-24 DIAGNOSIS — I10 Essential (primary) hypertension: Secondary | ICD-10-CM

## 2022-08-24 MED ORDER — POTASSIUM CHLORIDE CRYS ER 20 MEQ PO TBCR: 20.0000 meq | EXTENDED_RELEASE_TABLET | Freq: Two times a day (BID) | ORAL | 0 refills | Status: DC

## 2022-08-24 NOTE — Telephone Encounter (Signed)
Patient called and said he was told he needed to schedule an appointment. He scheduled an appointment at Dr [name] next open slot on 09/23/22 at 8:40

## 2022-09-12 ENCOUNTER — Other Ambulatory Visit: Payer: Self-pay | Admitting: Cardiology

## 2022-09-23 ENCOUNTER — Encounter: Payer: Self-pay | Admitting: Internal Medicine

## 2022-09-23 ENCOUNTER — Ambulatory Visit (INDEPENDENT_AMBULATORY_CARE_PROVIDER_SITE_OTHER): Payer: PPO | Admitting: Internal Medicine

## 2022-09-23 VITALS — BP 126/76 | HR 60 | Temp 98.2°F | Ht 72.0 in | Wt 193.0 lb

## 2022-09-23 DIAGNOSIS — I10 Essential (primary) hypertension: Secondary | ICD-10-CM

## 2022-09-23 DIAGNOSIS — Q383 Other congenital malformations of tongue: Secondary | ICD-10-CM | POA: Diagnosis not present

## 2022-09-23 DIAGNOSIS — R7303 Prediabetes: Secondary | ICD-10-CM | POA: Diagnosis not present

## 2022-09-23 DIAGNOSIS — D510 Vitamin B12 deficiency anemia due to intrinsic factor deficiency: Secondary | ICD-10-CM | POA: Diagnosis not present

## 2022-09-23 DIAGNOSIS — E781 Pure hyperglyceridemia: Secondary | ICD-10-CM

## 2022-09-23 DIAGNOSIS — E039 Hypothyroidism, unspecified: Secondary | ICD-10-CM

## 2022-09-23 LAB — BASIC METABOLIC PANEL
BUN: 17 mg/dL (ref 6–23)
CO2: 28 mEq/L (ref 19–32)
Calcium: 9.3 mg/dL (ref 8.4–10.5)
Chloride: 101 mEq/L (ref 96–112)
Creatinine, Ser: 0.95 mg/dL (ref 0.40–1.50)
GFR: 77.3 mL/min (ref >60.00)
Glucose, Bld: 114 mg/dL — ABNORMAL HIGH (ref 70–99)
Potassium: 4 mEq/L (ref 3.5–5.1)
Sodium: 139 mEq/L (ref 135–145)

## 2022-09-23 LAB — CBC WITH DIFFERENTIAL/PLATELET
Basophils Absolute: 0 10*3/uL (ref 0.0–0.1)
Basophils Relative: 0.5 % (ref 0.0–3.0)
Eosinophils Absolute: 0.2 10*3/uL (ref 0.0–0.7)
Eosinophils Relative: 2.6 % (ref 0.0–5.0)
HCT: 49.9 % (ref 39.0–52.0)
Hemoglobin: 17.5 g/dL — ABNORMAL HIGH (ref 13.0–17.0)
Lymphocytes Relative: 24.2 % (ref 12.0–46.0)
Lymphs Abs: 1.7 10*3/uL (ref 0.7–4.0)
MCHC: 35.2 g/dL (ref 30.0–36.0)
MCV: 88.6 fl (ref 78.0–100.0)
Monocytes Absolute: 0.6 10*3/uL (ref 0.1–1.0)
Monocytes Relative: 9 % (ref 3.0–12.0)
Neutro Abs: 4.5 10*3/uL (ref 1.4–7.7)
Neutrophils Relative %: 63.7 % (ref 43.0–77.0)
Platelets: 257 10*3/uL (ref 150.0–400.0)
RBC: 5.64 Mil/uL (ref 4.22–5.81)
RDW: 12.6 % (ref 11.5–15.5)
WBC: 7 10*3/uL (ref 4.0–10.5)

## 2022-09-23 LAB — VITAMIN B12: Vitamin B-12: 195 pg/mL — ABNORMAL LOW (ref 211–911)

## 2022-09-23 LAB — HEMOGLOBIN A1C: Hgb A1c MFr Bld: 6.2 % (ref 4.6–6.5)

## 2022-09-23 LAB — FOLATE: Folate: 13.5 ng/mL (ref >5.9)

## 2022-09-23 LAB — TRIGLYCERIDES: Triglycerides: 203 mg/dL — ABNORMAL HIGH (ref 0.0–149.0)

## 2022-09-23 LAB — TSH: TSH: 2.99 u[IU]/mL (ref 0.35–5.50)

## 2022-09-23 NOTE — Progress Notes (Signed)
Subjective:  Patient ID: Kevin Bradley, male    DOB: 22-Nov-1945  Age: 77 y.o. MRN: [id number]  CC: Hypertension, Hyperlipidemia, and Hypothyroidism   HPI Kevin Bradley presents for f/up -  He complains of a 1 year history of an abnormal tongue.  He showed it to his dentist who told him it was a benign finding.  He remains concerned about it.  He is active and denies chest pain, shortness of breath, diaphoresis, palpitations, or edema.  Outpatient Medications Prior to Visit  Medication Sig Dispense Refill   amLODipine (NORVASC) 10 MG tablet TAKE 1 TABLET(10 MG) BY MOUTH DAILY 90 tablet 2   aspirin 81 MG EC tablet Take 81 tablets by mouth 2 (two) times daily.     flecainide (TAMBOCOR) 50 MG tablet Take 1 tablet (50 mg total) by mouth 2 (two) times daily. Keep scheduled appointment for further refills 60 tablet 0   losartan-hydrochlorothiazide (HYZAAR) 100-12.5 MG tablet TAKE 1 TABLET BY MOUTH DAILY 90 tablet 0   nebivolol (BYSTOLIC) 2.5 MG tablet TAKE 1 TABLET(2.5 MG) BY MOUTH DAILY 90 tablet 3   omega-3 acid ethyl esters (LOVAZA) 1 g capsule TAKE 2 CAPSULES BY MOUTH TWICE DAILY 360 capsule 1   potassium chloride SA (KLOR-CON M) 20 MEQ tablet Take 1 tablet (20 mEq total) by mouth 2 (two) times daily. 60 tablet 0   VITAMIN D, CHOLECALCIFEROL, PO Take by mouth daily.     levothyroxine (SYNTHROID) 75 MCG tablet TAKE 1 TABLET(75 MCG) BY MOUTH DAILY BEFORE BREAKFAST 90 tablet 1   No facility-administered medications prior to visit.    ROS Review of Systems  Constitutional: Negative.  Negative for diaphoresis and fatigue.  HENT: Negative.    Eyes: Negative.   Respiratory:  Negative for cough, chest tightness, shortness of breath and wheezing.   Cardiovascular:  Negative for chest pain, palpitations and leg swelling.  Endocrine: Negative.   Genitourinary: Negative.  Negative for difficulty urinating.    Objective:  BP 126/76 (BP Location: Left Arm, Patient Position: Sitting, Cuff  Size: Large)   Pulse 60   Temp 98.2 F (36.8 C) (Oral)   Ht 6' (1.829 m)   Wt 193 lb (87.5 kg)   SpO2 93%   BMI 26.18 kg/m   BP Readings from Last 3 Encounters:  09/23/22 126/76  04/27/22 118/60  03/25/22 130/76    Wt Readings from Last 3 Encounters:  09/23/22 193 lb (87.5 kg)  04/27/22 193 lb 12.8 oz (87.9 kg)  03/25/22 193 lb (87.5 kg)    Physical Exam  Lab Results  Component Value Date   WBC 7.0 09/23/2022   HGB 17.5 (H) 09/23/2022   HCT 49.9 09/23/2022   PLT 257.0 09/23/2022   GLUCOSE 114 (H) 09/23/2022   CHOL 160 03/25/2022   TRIG 203.0 (H) 09/23/2022   HDL 33.10 (L) 03/25/2022   LDLDIRECT 79.0 03/25/2022   LDLCALC 87 09/04/2019   ALT 14 10/10/2020   AST 15 10/10/2020   NA 139 09/23/2022   K 4.0 09/23/2022   CL 101 09/23/2022   CREATININE 0.95 09/23/2022   BUN 17 09/23/2022   CO2 28 09/23/2022   TSH 2.99 09/23/2022   PSA 3.10 03/25/2022   HGBA1C 6.2 09/23/2022    VAS [place] LOWER EXTREMITY VENOUS REFLUX  Result Date: 07/13/2019  Lower Venous Reflux Study Indications: Swelling, Edema, Pain, and redness. Other Indications: Patent wears compression stocking and noticed bilateral rash,  left worse than right. Performing Technologist: Delorise Shiner RVT  Examination Guidelines: A complete evaluation includes B-mode imaging, spectral Doppler, color Doppler, and power Doppler as needed of all accessible portions of each vessel. Bilateral testing is considered an integral part of a complete examination. Limited examinations for reoccurring indications may be performed as noted. The reflux portion of the exam is performed with the patient in reverse Trendelenburg.  Right Technical Findings: No evidence of DVT, SVT, or Baker's cyst. The sapheno-femoral junction is competent. No evidence of GSV reflux. No evidence of SSV reflux. No evidence of deep venous reflux.  Left Technical Findings: No evidence of DVT, SVT, or Baker's cyst. The sapheno-femoral junction  is competent. No evidence of GSV reflux. No evidence of SSV reflux. The left common femoral vein demonstrates deep venous reflux.  Venous Reflux Times Normal value < 0.5 sec +--------------+------+---------+--------+--------+ RIGHT         RefluxReflux NoDiameterComments                Yes                            +--------------+------+---------+--------+--------+ CFV                    no                     +--------------+------+---------+--------+--------+ FV prox                no                     +--------------+------+---------+--------+--------+ Popliteal              no                     +--------------+------+---------+--------+--------+ GSV at [facility]             no     0.726           +--------------+------+---------+--------+--------+ GSV prox thigh         no     0.498           +--------------+------+---------+--------+--------+ GSV mid thigh          no     0.461           +--------------+------+---------+--------+--------+ GSV dist thigh         no     0.342           +--------------+------+---------+--------+--------+ GSV at knee            no     0.388           +--------------+------+---------+--------+--------+ GSV prox calf                 0.393           +--------------+------+---------+--------+--------+ GSV mid calf                  0.397           +--------------+------+---------+--------+--------+ SSV Pop Fossa                 0.260           +--------------+------+---------+--------+--------+ SSV prox calf          no     0.420           +--------------+------+---------+--------+--------+ SSV mid calf  no     0.356           +--------------+------+---------+--------+--------+  +--------------+------+---------+--------+--------+ LEFT          RefluxReflux NoDiameterComments                Yes                            +--------------+------+---------+--------+--------+ CFV             yes                            +--------------+------+---------+--------+--------+ FV prox                no                     +--------------+------+---------+--------+--------+ Popliteal              no                     +--------------+------+---------+--------+--------+ GSV at [facility]             no     0.739           +--------------+------+---------+--------+--------+ GSV prox thigh         no     0.534           +--------------+------+---------+--------+--------+ GSV mid thigh          no     0.603           +--------------+------+---------+--------+--------+ GSV dist thigh         no     0.438           +--------------+------+---------+--------+--------+ GSV at knee            no     0.475           +--------------+------+---------+--------+--------+ GSV prox calf                 0.429           +--------------+------+---------+--------+--------+ GSV mid calf                  0.489           +--------------+------+---------+--------+--------+ SSV Pop Fossa                 0.256           +--------------+------+---------+--------+--------+ SSV prox calf          no     0.219           +--------------+------+---------+--------+--------+ SSV mid calf           no     0.246           +--------------+------+---------+--------+--------+   Summary: Right: There is no evidence of deep vein thrombosis in the lower extremity. There is no evidence of superficial venous thrombosis. No cystic structure found in the popliteal fossa. Left: Evidence of chronic venous insufficiency is detected in the deep venous system. There is no evidence of deep vein thrombosis in the lower extremity. There is no evidence of superficial venous thrombosis. No cystic structure found in the popliteal fossa.  *See table(s) above for measurements and observations. Electronically signed by Deitra Mayo MD on 07/13/2019 at 10:30:24 AM.    Final      Assessment & Plan:   Kevin Bradley  was seen today for hypertension, hyperlipidemia and hypothyroidism.  Diagnoses and all orders for this visit:  Acquired hypothyroidism- He is euthyroid. -     TSH; Future -     TSH -     levothyroxine (SYNTHROID) 75 MCG tablet; Take 1 tablet (75 mcg total) by mouth daily before breakfast.  Essential hypertension- His blood pressure is adequately well-controlled. 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 -     Vitamin B1; Future -     Vitamin B12; Future -     Folate; Future -     Folate -     Vitamin B12 -     Vitamin B1  Vitamin B12 deficiency anemia due to intrinsic factor deficiency- Will start parenteral B12 replacement therapy.   I have changed Kevin L. Volanda Napoleon "Edrees Guettler"'s levothyroxine. I am also having him maintain his aspirin EC, (VITAMIN D, CHOLECALCIFEROL, PO), nebivolol, amLODipine, omega-3 acid ethyl esters, losartan-hydrochlorothiazide, potassium chloride SA, and flecainide.  Meds ordered this encounter  Medications   levothyroxine (SYNTHROID) 75 MCG tablet    Sig: Take 1 tablet (75 mcg total) by mouth daily before breakfast.    Dispense:  90 tablet    Refill:  1     Follow-up: Return in about 6 months (around 03/24/2023).  Scarlette Calico, MD

## 2022-09-23 NOTE — Patient Instructions (Signed)

## 2022-09-24 ENCOUNTER — Ambulatory Visit (INDEPENDENT_AMBULATORY_CARE_PROVIDER_SITE_OTHER): Payer: PPO | Admitting: *Deleted

## 2022-09-24 DIAGNOSIS — D510 Vitamin B12 deficiency anemia due to intrinsic factor deficiency: Secondary | ICD-10-CM | POA: Diagnosis not present

## 2022-09-24 MED ORDER — CYANOCOBALAMIN 1000 MCG/ML IJ SOLN
1000.0000 ug | Freq: Once | INTRAMUSCULAR | Status: AC
  Administered 2022-09-24: 1000 ug via INTRAMUSCULAR

## 2022-09-24 NOTE — Progress Notes (Signed)
Pls cosign for B12 inj.Kevin KitchenJohny Chess Per MD pt to get monthly

## 2022-09-27 MED ORDER — LEVOTHYROXINE SODIUM 75 MCG PO TABS: 75.0000 ug | ORAL_TABLET | Freq: Every day | ORAL | 1 refills | Status: DC

## 2022-09-28 ENCOUNTER — Other Ambulatory Visit: Payer: Self-pay | Admitting: Internal Medicine

## 2022-09-28 DIAGNOSIS — E519 Thiamine deficiency, unspecified: Secondary | ICD-10-CM | POA: Insufficient documentation

## 2022-09-28 LAB — VITAMIN B1: Vitamin B1 (Thiamine): 7 nmol/L — ABNORMAL LOW (ref 8–30)

## 2022-09-28 MED ORDER — VITAMIN B-1 50 MG PO TABS: 50.0000 mg | ORAL_TABLET | Freq: Every day | ORAL | 1 refills | Status: AC

## 2022-09-29 ENCOUNTER — Encounter: Payer: Self-pay | Admitting: Internal Medicine

## 2022-10-05 NOTE — Progress Notes (Signed)
HPI: FU atrial fibrillation. A previous cardioNet monitor revealed sinus rhythm with occasional bursts of atrial fibrillation. He had an episode of chest pain approximately 9 years ago and had a cardiac catheterization that was normal by his report perfomed in [place]. Patient placed on flecanide previously. A followup stress echocardiogram showed no ischemia and normal LV function. There was a question of septal hypokinesis both at rest and with stress. There was no exercise induced arrhythmias. Cardionet in Aug 2012 showed sinus with PAT but no VT. Previously declined anticoagulants. Abdominal ultrasound in May of 2014 showed no aneurysm.  Echocardiogram September 2020 showed normal LV function, mild left ventricular hypertrophy, mild left atrial enlargement.  Since I last saw him he denies dyspnea, chest pain, palpitations or syncope.  Current Outpatient Medications  Medication Sig Dispense Refill   amLODipine (NORVASC) 10 MG tablet TAKE 1 TABLET(10 MG) BY MOUTH DAILY 90 tablet 2   aspirin 81 MG EC tablet Take 81 tablets by mouth 2 (two) times daily.     flecainide (TAMBOCOR) 50 MG tablet Take 1 tablet (50 mg total) by mouth 2 (two) times daily. Keep scheduled appointment for further refills 60 tablet 0   levothyroxine (SYNTHROID) 75 MCG tablet Take 1 tablet (75 mcg total) by mouth daily before breakfast. 90 tablet 1   losartan-hydrochlorothiazide (HYZAAR) 100-12.5 MG tablet TAKE 1 TABLET BY MOUTH DAILY 90 tablet 0   nebivolol (BYSTOLIC) 2.5 MG tablet TAKE 1 TABLET(2.5 MG) BY MOUTH DAILY 90 tablet 3   omega-3 acid ethyl esters (LOVAZA) 1 g capsule TAKE 2 CAPSULES BY MOUTH TWICE DAILY 360 capsule 1   potassium chloride SA (KLOR-CON M) 20 MEQ tablet TAKE 1 TABLET(20 MEQ) BY MOUTH TWICE DAILY 60 tablet 3   thiamine (VITAMIN B-1) 50 MG tablet Take 1 tablet (50 mg total) by mouth daily. 90 tablet 1   VITAMIN D, CHOLECALCIFEROL, PO Take by mouth daily.     No current facility-administered  medications for this visit.     Past Medical History:  Diagnosis Date   Anxiety    Atrial fibrillation (HCC)    BPH (benign prostatic hypertrophy)    External thrombosed hemorrhoids    History of colonoscopy    HTN (hypertension)    Hx of colonic polyps    Hypothyroidism    Low HDL (under 40)    Nephrolithiasis     Past Surgical History:  Procedure Laterality Date   APPENDECTOMY     cyst tongue  1990   benign   INGUINAL HERNIA REPAIR Right 1990   INGUINAL HERNIA REPAIR Left 1948   THYROIDECTOMY, PARTIAL  1990   TONSILLECTOMY  1948    Social History   Socioeconomic History   Marital status: Married    Spouse name: Not on file   Number of children: Not on file   Years of education: Not on file   Highest education level: Not on file  Occupational History   Occupation: Truck Geophysicist/field seismologist  Tobacco Use   Smoking status: Former    Packs/day: 2.00    Years: 10.00    Total pack years: 20.00    Types: Cigars, Cigarettes    Quit date: 2005    Years since quitting: 19.1   Smokeless tobacco: Never   Tobacco comments:    quit smoking cigars in 2017--04/15/18  Vaping Use   Vaping Use: Never used  Substance and Sexual Activity   Alcohol use: No    Alcohol/week: 0.0 standard drinks of alcohol  Drug use: No   Sexual activity: Yes  Other Topics Concern   Not on file  Social History Narrative   Not on file   Social Determinants of Health   Financial Resource Strain: Low Risk  (04/27/2022)   Overall Financial Resource Strain (CARDIA)    Difficulty of Paying Living Expenses: Not hard at all  Food Insecurity: No Food Insecurity (04/27/2022)   Hunger Vital Sign    Worried About Running Out of Food in the Last Year: Never true    Ran Out of Food in the Last Year: Never true  Transportation Needs: No Transportation Needs (04/27/2022)   PRAPARE - Hydrologist (Medical): No    Lack of Transportation (Non-Medical): No  Physical Activity: Sufficiently  Active (04/27/2022)   Exercise Vital Sign    Days of Exercise per Week: 5 days    Minutes of Exercise per Session: 30 min  Stress: No Stress Concern Present (04/27/2022)   [place]    Feeling of Stress : Not at all  Social Connections: Moderately Isolated (04/27/2022)   Social Connection and Isolation Panel [NHANES]    Frequency of Communication with Friends and Family: More than three times a week    Frequency of Social Gatherings with Friends and Family: Once a week    Attends Religious Services: Never    Marine scientist or Organizations: No    Attends Archivist Meetings: Never    Marital Status: Married  Human resources officer Violence: Not At Risk (04/27/2022)   Humiliation, Afraid, Rape, and Kick questionnaire    Fear of Current or Ex-Partner: No    Emotionally Abused: No    Physically Abused: No    Sexually Abused: No    Family History  Problem Relation Age of Onset   Heart attack Father    AAA (abdominal aortic aneurysm) Father 35   Alcohol abuse Other    Breast cancer Other    Cancer Neg Hx    Early death Neg Hx    Diabetes Neg Hx    Heart disease Neg Hx    Hyperlipidemia Neg Hx    Hypertension Neg Hx    Kidney disease Neg Hx    Stroke Neg Hx    Colon cancer Neg Hx     ROS: no fevers or chills, productive cough, hemoptysis, dysphasia, odynophagia, melena, hematochezia, dysuria, hematuria, rash, seizure activity, orthopnea, PND, pedal edema, claudication. Remaining systems are negative.  Physical Exam: Well-developed well-nourished in no acute distress.  Skin is warm and dry.  HEENT is normal.  Neck is supple.  Chest is clear to auscultation with normal expansion.  Cardiovascular exam is regular rate and rhythm.  Abdominal exam nontender or distended. No masses palpated. Extremities show no edema. neuro grossly intact  ECG-sinus bradycardia with first-degree AV block, cannot rule  out septal infarct.  Personally reviewed  A/P  1 paroxysmal atrial fibrillation-continue flecainide and Bystolic.  He has had no recurrences based on his history.  He continues to decline anticoagulation understanding the high risk of CVA.  2 hypertension-patient's blood pressure is controlled.  Continue present medications.  3 hyperlipidemia-Per primary care  4 history of palpitations-continue beta-blocker.  Kirk Ruths, MD

## 2022-10-06 ENCOUNTER — Other Ambulatory Visit: Payer: Self-pay | Admitting: Internal Medicine

## 2022-10-06 DIAGNOSIS — I10 Essential (primary) hypertension: Secondary | ICD-10-CM

## 2022-10-15 ENCOUNTER — Other Ambulatory Visit: Payer: Self-pay | Admitting: Cardiology

## 2022-10-16 ENCOUNTER — Encounter: Payer: Self-pay | Admitting: Cardiology

## 2022-10-16 ENCOUNTER — Ambulatory Visit: Payer: PPO | Attending: Cardiology | Admitting: Cardiology

## 2022-10-16 VITALS — BP 118/74 | HR 69 | Ht 72.0 in | Wt 193.6 lb

## 2022-10-16 DIAGNOSIS — I480 Paroxysmal atrial fibrillation: Secondary | ICD-10-CM

## 2022-10-16 DIAGNOSIS — R002 Palpitations: Secondary | ICD-10-CM | POA: Diagnosis not present

## 2022-10-16 DIAGNOSIS — I10 Essential (primary) hypertension: Secondary | ICD-10-CM

## 2022-10-16 NOTE — Patient Instructions (Signed)
    Follow-Up: At [facility], you and your health needs are our priority.  As part of our continuing mission to provide you with exceptional heart care, we have created designated Provider Care Teams.  These Care Teams include your primary Cardiologist (physician) and Advanced Practice Providers (APPs -  Physician Assistants and Nurse Practitioners) who all work together to provide you with the care you need, when you need it.  We recommend signing up for the patient portal called "MyChart".  Sign up information is provided on this After Visit Summary.  MyChart is used to connect with patients for Virtual Visits (Telemedicine).  Patients are able to view lab/test results, encounter notes, upcoming appointments, etc.  Non-urgent messages can be sent to your provider as well.   To learn more about what you can do with MyChart, go to [email or web address].    Your next appointment:   12 month(s)  Provider:   Brian Crenshaw, MD     

## 2022-10-19 ENCOUNTER — Encounter: Payer: Self-pay | Admitting: Cardiology

## 2022-10-20 MED ORDER — FLECAINIDE ACETATE 50 MG PO TABS: 50.0000 mg | ORAL_TABLET | Freq: Two times a day (BID) | ORAL | 0 refills | Status: DC

## 2022-10-26 ENCOUNTER — Ambulatory Visit (INDEPENDENT_AMBULATORY_CARE_PROVIDER_SITE_OTHER): Payer: PPO

## 2022-10-26 DIAGNOSIS — E538 Deficiency of other specified B group vitamins: Secondary | ICD-10-CM

## 2022-10-26 MED ORDER — FLECAINIDE ACETATE 50 MG PO TABS: 50.0000 mg | ORAL_TABLET | Freq: Two times a day (BID) | ORAL | 3 refills | Status: DC

## 2022-10-26 MED ORDER — CYANOCOBALAMIN 1000 MCG/ML IJ SOLN
1000.0000 ug | Freq: Once | INTRAMUSCULAR | Status: AC
  Administered 2022-10-26: 1000 ug via INTRAMUSCULAR

## 2022-10-26 NOTE — Addendum Note (Signed)
Addended by: Cristopher Estimable on: 10/26/2022 11:36 AM   Modules accepted: Orders

## 2022-10-26 NOTE — Progress Notes (Signed)
After obtaining consent, and per orders of Dr. [name], injection of B12 given by Marrian Salvage. Patient instructed to report any adverse reaction to me immediately.

## 2022-10-27 DIAGNOSIS — H40053 Ocular hypertension, bilateral: Secondary | ICD-10-CM | POA: Diagnosis not present

## 2022-10-27 DIAGNOSIS — H401134 Primary open-angle glaucoma, bilateral, indeterminate stage: Secondary | ICD-10-CM | POA: Diagnosis not present

## 2022-10-27 DIAGNOSIS — H35033 Hypertensive retinopathy, bilateral: Secondary | ICD-10-CM | POA: Diagnosis not present

## 2022-10-27 DIAGNOSIS — H26493 Other secondary cataract, bilateral: Secondary | ICD-10-CM | POA: Diagnosis not present

## 2022-10-27 DIAGNOSIS — H43813 Vitreous degeneration, bilateral: Secondary | ICD-10-CM | POA: Diagnosis not present

## 2022-11-17 ENCOUNTER — Other Ambulatory Visit: Payer: Self-pay

## 2022-11-17 MED ORDER — NEBIVOLOL HCL 2.5 MG PO TABS: ORAL_TABLET | ORAL | 3 refills | Status: DC

## 2022-11-20 ENCOUNTER — Ambulatory Visit (INDEPENDENT_AMBULATORY_CARE_PROVIDER_SITE_OTHER): Payer: PPO

## 2022-11-20 DIAGNOSIS — D510 Vitamin B12 deficiency anemia due to intrinsic factor deficiency: Secondary | ICD-10-CM | POA: Diagnosis not present

## 2022-11-20 MED ORDER — CYANOCOBALAMIN 1000 MCG/ML IJ SOLN
1000.0000 ug | Freq: Once | INTRAMUSCULAR | Status: AC
  Administered 2022-11-20: 1000 ug via INTRAMUSCULAR

## 2022-11-20 NOTE — Progress Notes (Signed)
Pt was given B12 w/o any complications. 

## 2022-11-25 ENCOUNTER — Ambulatory Visit: Payer: PPO

## 2022-12-11 DIAGNOSIS — H401134 Primary open-angle glaucoma, bilateral, indeterminate stage: Secondary | ICD-10-CM | POA: Diagnosis not present

## 2022-12-11 DIAGNOSIS — H35033 Hypertensive retinopathy, bilateral: Secondary | ICD-10-CM | POA: Diagnosis not present

## 2022-12-11 DIAGNOSIS — H40053 Ocular hypertension, bilateral: Secondary | ICD-10-CM | POA: Diagnosis not present

## 2022-12-15 ENCOUNTER — Encounter: Payer: Self-pay | Admitting: Internal Medicine

## 2022-12-15 ENCOUNTER — Other Ambulatory Visit: Payer: Self-pay | Admitting: Internal Medicine

## 2022-12-15 DIAGNOSIS — F40243 Fear of flying: Secondary | ICD-10-CM

## 2022-12-15 DIAGNOSIS — E781 Pure hyperglyceridemia: Secondary | ICD-10-CM

## 2022-12-15 MED ORDER — DIAZEPAM 5 MG PO TABS: 5.0000 mg | ORAL_TABLET | Freq: Two times a day (BID) | ORAL | 1 refills | Status: DC | PRN

## 2022-12-21 ENCOUNTER — Ambulatory Visit (INDEPENDENT_AMBULATORY_CARE_PROVIDER_SITE_OTHER): Payer: PPO

## 2022-12-21 DIAGNOSIS — E538 Deficiency of other specified B group vitamins: Secondary | ICD-10-CM

## 2022-12-21 MED ORDER — CYANOCOBALAMIN 1000 MCG/ML IJ SOLN
1000.0000 ug | Freq: Once | INTRAMUSCULAR | Status: AC
  Administered 2022-12-21: 1000 ug via INTRAMUSCULAR

## 2022-12-21 NOTE — Progress Notes (Signed)
After obtaining consent, and per orders of Dr. Jones, injection of B12 given by Irvine Glorioso P Shiloh Swopes. Patient instructed to report any adverse reaction to me immediately.  

## 2022-12-24 ENCOUNTER — Ambulatory Visit: Payer: PPO

## 2023-01-13 ENCOUNTER — Other Ambulatory Visit: Payer: Self-pay | Admitting: Internal Medicine

## 2023-01-13 DIAGNOSIS — I10 Essential (primary) hypertension: Secondary | ICD-10-CM

## 2023-01-18 ENCOUNTER — Telehealth: Payer: Self-pay | Admitting: Internal Medicine

## 2023-01-18 NOTE — Telephone Encounter (Signed)
Called patient to schedule Medicare Annual Wellness Visit (AWV). Left message for patient to call back and schedule Medicare Annual Wellness Visit (AWV).  Last date of AWV: 04/27/2022  Please schedule an appointment at any time with [facility].  If any questions, please contact me at [phone].  Thank you ,  Bernice Cicero Care Guide [facility] AWV TEAM Direct Dial: [phone]    

## 2023-01-18 NOTE — Telephone Encounter (Signed)
Contacted [name] to schedule their annual wellness visit. Appointment made for 02/02/2023.  [facility] Care Guide [place] AWV TEAM Direct Dial: [phone]

## 2023-01-25 ENCOUNTER — Ambulatory Visit (INDEPENDENT_AMBULATORY_CARE_PROVIDER_SITE_OTHER): Payer: PPO

## 2023-01-25 DIAGNOSIS — E538 Deficiency of other specified B group vitamins: Secondary | ICD-10-CM

## 2023-01-25 MED ORDER — CYANOCOBALAMIN 1000 MCG/ML IJ SOLN
1000.0000 ug | Freq: Once | INTRAMUSCULAR | Status: AC
Start: 2023-01-25 — End: 2023-01-25
  Administered 2023-01-25: 1000 ug via INTRAMUSCULAR

## 2023-01-25 NOTE — Progress Notes (Signed)
After obtaining consent, and per orders of Dr. Jones, injection of B12 given by Norton Bivins P Clifford Benninger. Patient instructed to report any adverse reaction to me immediately.  

## 2023-01-29 ENCOUNTER — Ambulatory Visit (INDEPENDENT_AMBULATORY_CARE_PROVIDER_SITE_OTHER): Payer: PPO | Admitting: Internal Medicine

## 2023-01-29 ENCOUNTER — Encounter: Payer: Self-pay | Admitting: Internal Medicine

## 2023-01-29 ENCOUNTER — Ambulatory Visit (INDEPENDENT_AMBULATORY_CARE_PROVIDER_SITE_OTHER): Payer: PPO

## 2023-01-29 VITALS — BP 120/68 | HR 60 | Temp 98.5°F | Ht 72.0 in | Wt 185.0 lb

## 2023-01-29 DIAGNOSIS — R29898 Other symptoms and signs involving the musculoskeletal system: Secondary | ICD-10-CM | POA: Diagnosis not present

## 2023-01-29 DIAGNOSIS — M545 Low back pain, unspecified: Secondary | ICD-10-CM

## 2023-01-29 DIAGNOSIS — G8929 Other chronic pain: Secondary | ICD-10-CM

## 2023-01-29 DIAGNOSIS — M47816 Spondylosis without myelopathy or radiculopathy, lumbar region: Secondary | ICD-10-CM | POA: Diagnosis not present

## 2023-01-29 NOTE — Progress Notes (Addendum)
Subjective:    Patient ID: Kevin Bradley, male    DOB: 1946-08-21, 77 y.o.   MRN: [id number]      HPI Kevin Bradley is here for  Chief Complaint  Patient presents with   Extremity Weakness    Bilateral leg weakness    Has had chronic lower back issues - injury in HS. it was likely a herniated disc.  Back gets stiff on a daily basis, but no pain daily.  If he walks a lot he gets pain in his lower back..  Started having bilateral leg weakness x 2 weeks - he got out of bed in the morning and was walking - legs felt like they had no energy - it was kind of paiful -had to sit- one minute later he was  ok.  Last wednesday - same thing in the morning and then in the afternoon he was walking and it felt like the energy was going out of his legs and he could not walk - had to sit down and was ok after a minute or so then he could walk again.  In general x 2 years- legs weaker - less steady - they get shaky.  Can not stand on one leg anymore - very unsteady..  Whole leg affected when this occurs  Tingling/numnbess  a little in both legs  No cahnge in bowel / bladder control  When contracts toes - skin in feet feel funny     Medications and allergies reviewed with patient and updated if appropriate.  Current Outpatient Medications on File Prior to Visit  Medication Sig Dispense Refill   amLODipine (NORVASC) 10 MG tablet TAKE 1 TABLET(10 MG) BY MOUTH DAILY 90 tablet 2   aspirin 81 MG EC tablet Take 81 tablets by mouth 2 (two) times daily.     diazepam (VALIUM) 5 MG tablet Take 1 tablet (5 mg total) by mouth every 12 (twelve) hours as needed for anxiety. 10 tablet 1   flecainide (TAMBOCOR) 50 MG tablet Take 1 tablet (50 mg total) by mouth 2 (two) times daily. Keep scheduled appointment for further refills 180 tablet 3   latanoprost (XALATAN) 0.005 % ophthalmic solution 1 drop at bedtime.     levothyroxine (SYNTHROID) 75 MCG tablet Take 1 tablet (75 mcg total) by mouth daily before  breakfast. 90 tablet 1   losartan-hydrochlorothiazide (HYZAAR) 100-12.5 MG tablet TAKE 1 TABLET BY MOUTH DAILY 90 tablet 0   nebivolol (BYSTOLIC) 2.5 MG tablet TAKE 1 TABLET(2.5 MG) BY MOUTH DAILY 90 tablet 3   omega-3 acid ethyl esters (LOVAZA) 1 g capsule TAKE 2 CAPSULES BY MOUTH TWICE DAILY 360 capsule 1   potassium chloride SA (KLOR-CON M) 20 MEQ tablet TAKE 1 TABLET(20 MEQ) BY MOUTH TWICE DAILY 60 tablet 3   thiamine (VITAMIN B-1) 50 MG tablet Take 1 tablet (50 mg total) by mouth daily. 90 tablet 1   VITAMIN D, CHOLECALCIFEROL, PO Take by mouth daily.     No current facility-administered medications on file prior to visit.    Review of Systems  Constitutional:  Negative for fever.  Respiratory:  Negative for shortness of breath.   Cardiovascular:  Positive for palpitations (afib - paroxysmal) and leg swelling (if drives a lot). Negative for chest pain.  Musculoskeletal:  Positive for back pain.  Neurological:  Positive for weakness (intermittent b/l legs) and light-headedness (occ - random). Negative for headaches.       Objective:   Vitals:   01/29/23 7829  BP: 120/68  Pulse: 60  Temp: 98.5 F (36.9 C)  SpO2: 98%   BP Readings from Last 3 Encounters:  01/29/23 120/68  10/16/22 118/74  09/23/22 126/76   Wt Readings from Last 3 Encounters:  01/29/23 185 lb (83.9 kg)  10/16/22 193 lb 9.6 oz (87.8 kg)  09/23/22 193 lb (87.5 kg)   Body mass index is 25.09 kg/m.    Physical Exam Constitutional:      General: He is not in acute distress.    Appearance: Normal appearance. He is not ill-appearing.  HENT:     Head: Normocephalic and atraumatic.  Musculoskeletal:        General: No tenderness (no tenderness lumbar spine or across lower back).     Right lower leg: No edema.     Left lower leg: No edema.  Skin:    General: Skin is warm and dry.  Neurological:     Mental Status: He is alert.     Sensory: No sensory deficit.     Motor: No weakness.     Deep Tendon  Reflexes: Reflexes normal.     Comments: Negative straight leg raise b/l            Assessment & Plan:    Chronic lower back pain with intermittent weakness in legs x 2 years - weakness in legs worse x 2 weeks : Chronic lower back pain-typically day to day he has stiffness, but will have pain with walking longer distances Over the past few years has had some leg weakness and unsteadiness that has gotten worse over the past 2 weeks where he has had a few episodes of significant leg weakness requiring him to sit down, which did resolve the symptoms in a minute or so He also states numbness/tingling in both legs Probable history of herniated disc-has not seen anyone in decades for his back Recent symptoms concerning for pressure on the spinal cord X-ray today MRI of lumbar spine As valium at home - take 5mg  1 hr prior to MRI, can repeat  x 1 prn Depending on results and symptoms will refer to orthopedics or neurosurgery Advised him to let me know if his symptoms are worsening or increasing prior to the MRI and seeing specialist   Hypertension: Chronic BP well controlled Continue current medications - amlodipine 10 mg daily, Hyzaar 100-12.5 mg daily, bystolic 2.5 mg  daily

## 2023-01-29 NOTE — Patient Instructions (Addendum)
      Have an xray today downstairs.    An MRI will be ordered.   Take one pill 1 hour prior to MRI, may repeat x 1 if needed.

## 2023-02-02 ENCOUNTER — Ambulatory Visit (INDEPENDENT_AMBULATORY_CARE_PROVIDER_SITE_OTHER): Payer: PPO

## 2023-02-02 ENCOUNTER — Other Ambulatory Visit: Payer: Self-pay | Admitting: Internal Medicine

## 2023-02-02 VITALS — Wt 185.0 lb

## 2023-02-02 DIAGNOSIS — Z0000 Encounter for general adult medical examination without abnormal findings: Secondary | ICD-10-CM

## 2023-02-02 DIAGNOSIS — I10 Essential (primary) hypertension: Secondary | ICD-10-CM

## 2023-02-02 NOTE — Patient Instructions (Signed)
Kevin Bradley , Thank you for taking time to come for your Medicare Wellness Visit. I appreciate your ongoing commitment to your health goals. Please review the following plan we discussed and let me know if I can assist you in the future.   These are the goals we discussed:  Goals      Manage My Medicine     Timeframe:  Long-Range Goal Priority:  Medium Start Date:    01/27/21                         Expected End Date:   01/27/22                    Follow Up Date Nov 2022   - call for medicine refill 2 or 3 days before it runs out - call if I am sick and can't take my medicine - keep a list of all the medicines I take; vitamins and herbals too    Why is this important?   These steps will help you keep on track with your medicines.   Notes:      My goal is to stay alive.        This is a list of the screening recommended for you and due dates:  Health Maintenance  Topic Date Due   Zoster (Shingles) Vaccine (1 of 2) Never done   DTaP/Tdap/Td vaccine (2 - Tdap) 08/11/2020   COVID-19 Vaccine (3 - 2023-24 season) 05/08/2022   Flu Shot  04/08/2023   Medicare Annual Wellness Visit  02/02/2024   Pneumonia Vaccine  Completed   Hepatitis C Screening  Completed   HPV Vaccine  Aged Out   Colon Cancer Screening  Discontinued    Advanced directives: Please bring a copy of your health care power of attorney and living will to the office to be added to your chart at your convenience.   Conditions/risks identified: Aim for 30 minutes of exercise or brisk walking, 6-8 glasses of water, and 5 servings of fruits and vegetables each day.   Next appointment: Follow up in one year for your annual wellness visit. 05/29/225  Preventive Care 65 Years and Older, Male  Preventive care refers to lifestyle choices and visits with your health care provider that can promote health and wellness. What does preventive care include? A yearly physical exam. This is also called an annual well check. Dental  exams once or twice a year. Routine eye exams. Ask your health care provider how often you should have your eyes checked. Personal lifestyle choices, including: Daily care of your teeth and gums. Regular physical activity. Eating a healthy diet. Avoiding tobacco and drug use. Limiting alcohol use. Practicing safe sex. Taking low doses of aspirin every day. Taking vitamin and mineral supplements as recommended by your health care provider. What happens during an annual well check? The services and screenings done by your health care provider during your annual well check will depend on your age, overall health, lifestyle risk factors, and family history of disease. Counseling  Your health care provider may ask you questions about your: Alcohol use. Tobacco use. Drug use. Emotional well-being. Home and relationship well-being. Sexual activity. Eating habits. History of falls. Memory and ability to understand (cognition). Work and work Astronomer. Screening  You may have the following tests or measurements: Height, weight, and BMI. Blood pressure. Lipid and cholesterol levels. These may be checked every 5 years, or more frequently if you  are over 27 years old. Skin check. Lung cancer screening. You may have this screening every year starting at age 65 if you have a 30-pack-year history of smoking and currently smoke or have quit within the past 15 years. Fecal occult blood test (FOBT) of the stool. You may have this test every year starting at age 35. Flexible sigmoidoscopy or colonoscopy. You may have a sigmoidoscopy every 5 years or a colonoscopy every 10 years starting at age 8. Prostate cancer screening. Recommendations will vary depending on your family history and other risks. Hepatitis C blood test. Hepatitis B blood test. Sexually transmitted disease (STD) testing. Diabetes screening. This is done by checking your blood sugar (glucose) after you have not eaten for a while  (fasting). You may have this done every 1-3 years. Abdominal aortic aneurysm (AAA) screening. You may need this if you are a current or former smoker. Osteoporosis. You may be screened starting at age 30 if you are at high risk. Talk with your health care provider about your test results, treatment options, and if necessary, the need for more tests. Vaccines  Your health care provider may recommend certain vaccines, such as: Influenza vaccine. This is recommended every year. Tetanus, diphtheria, and acellular pertussis (Tdap, Td) vaccine. You may need a Td booster every 10 years. Zoster vaccine. You may need this after age 51. Pneumococcal 13-valent conjugate (PCV13) vaccine. One dose is recommended after age 61. Pneumococcal polysaccharide (PPSV23) vaccine. One dose is recommended after age 63. Talk to your health care provider about which screenings and vaccines you need and how often you need them. This information is not intended to replace advice given to you by your health care provider. Make sure you discuss any questions you have with your health care provider. Document Released: 09/20/2015 Document Revised: 05/13/2016 Document Reviewed: 06/25/2015 Elsevier Interactive Patient Education  2017 [employer].  Fall Prevention in the Home Falls can cause injuries. They can happen to people of all ages. There are many things you can do to make your home safe and to help prevent falls. What can I do on the outside of my home? Regularly fix the edges of walkways and driveways and fix any cracks. Remove anything that might make you trip as you walk through a door, such as a raised step or threshold. Trim any bushes or trees on the path to your home. Use bright outdoor lighting. Clear any walking paths of anything that might make someone trip, such as rocks or tools. Regularly check to see if handrails are loose or broken. Make sure that both sides of any steps have handrails. Any raised  decks and porches should have guardrails on the edges. Have any leaves, snow, or ice cleared regularly. Use sand or salt on walking paths during winter. Clean up any spills in your garage right away. This includes oil or grease spills. What can I do in the bathroom? Use night lights. Install grab bars by the toilet and in the tub and shower. Do not use towel bars as grab bars. Use non-skid mats or decals in the tub or shower. If you need to sit down in the shower, use a plastic, non-slip stool. Keep the floor dry. Clean up any water that spills on the floor as soon as it happens. Remove soap buildup in the tub or shower regularly. Attach bath mats securely with double-sided non-slip rug tape. Do not have throw rugs and other things on the floor that can make you trip.  What can I do in the bedroom? Use night lights. Make sure that you have a light by your bed that is easy to reach. Do not use any sheets or blankets that are too big for your bed. They should not hang down onto the floor. Have a firm chair that has side arms. You can use this for support while you get dressed. Do not have throw rugs and other things on the floor that can make you trip. What can I do in the kitchen? Clean up any spills right away. Avoid walking on wet floors. Keep items that you use a lot in easy-to-reach places. If you need to reach something above you, use a strong step stool that has a grab bar. Keep electrical cords out of the way. Do not use floor polish or wax that makes floors slippery. If you must use wax, use non-skid floor wax. Do not have throw rugs and other things on the floor that can make you trip. What can I do with my stairs? Do not leave any items on the stairs. Make sure that there are handrails on both sides of the stairs and use them. Fix handrails that are broken or loose. Make sure that handrails are as long as the stairways. Check any carpeting to make sure that it is firmly attached  to the stairs. Fix any carpet that is loose or worn. Avoid having throw rugs at the top or bottom of the stairs. If you do have throw rugs, attach them to the floor with carpet tape. Make sure that you have a light switch at the top of the stairs and the bottom of the stairs. If you do not have them, ask someone to add them for you. What else can I do to help prevent falls? Wear shoes that: Do not have high heels. Have rubber bottoms. Are comfortable and fit you well. Are closed at the toe. Do not wear sandals. If you use a stepladder: Make sure that it is fully opened. Do not climb a closed stepladder. Make sure that both sides of the stepladder are locked into place. Ask someone to hold it for you, if possible. Clearly mark and make sure that you can see: Any grab bars or handrails. First and last steps. Where the edge of each step is. Use tools that help you move around (mobility aids) if they are needed. These include: Canes. Walkers. Scooters. Crutches. Turn on the lights when you go into a dark area. Replace any light bulbs as soon as they burn out. Set up your furniture so you have a clear path. Avoid moving your furniture around. If any of your floors are uneven, fix them. If there are any pets around you, be aware of where they are. Review your medicines with your doctor. Some medicines can make you feel dizzy. This can increase your chance of falling. Ask your doctor what other things that you can do to help prevent falls. This information is not intended to replace advice given to you by your health care provider. Make sure you discuss any questions you have with your health care provider. Document Released: 06/20/2009 Document Revised: 01/30/2016 Document Reviewed: 09/28/2014 Elsevier Interactive Patient Education  2017 [employer].

## 2023-02-02 NOTE — Progress Notes (Signed)
Subjective:   Kevin Bradley is a 77 y.o. male who presents for Medicare Annual/Subsequent preventive examination.  Review of Systems    I connected with  [name] on 02/02/23 by a audio enabled telemedicine application and verified that I am speaking with the correct person using two identifiers.  Patient Medicare AWV questionnaire was completed by the patient on 01/28/23; I have confirmed that all information answered by patient is correct and no changes since this date.     Patient Location: Home  Provider Location: Home Office  I discussed the limitations of evaluation and management by telemedicine. The patient expressed understanding and agreed to proceed.  Cardiac Risk Factors include: hypertension     Objective:    Today's Vitals   02/02/23 0817  Weight: 185 lb (83.9 kg)   Body mass index is 25.09 kg/m.     02/02/2023    8:35 AM 04/27/2022    9:07 AM 04/11/2021    9:58 AM 03/11/2018    9:12 PM 01/05/2018    1:30 PM 10/24/2016   11:59 AM 02/01/2015    9:12 AM  Advanced Directives  Does Patient Have a Medical Advance Directive? Yes No Yes No No Yes No  Type of Estate agent of [place];Living will  Healthcare Power of [employer] [place];Living will   Does patient want to make changes to medical advance directive?  No - Patient declined No - Patient declined   No - Patient declined   Copy of Healthcare Power of Attorney in Chart? No - copy requested  No - copy requested   Yes   Would patient like information on creating a medical advance directive?  No - Patient declined  No - Patient declined No - Patient declined      Current Medications (verified) Outpatient Encounter Medications as of 02/02/2023  Medication Sig   amLODipine (NORVASC) 10 MG tablet TAKE 1 TABLET(10 MG) BY MOUTH DAILY   aspirin 81 MG EC tablet Take 81 tablets by mouth 2 (two) times daily.   diazepam (VALIUM) 5 MG tablet Take 1 tablet (5 mg total) by mouth  every 12 (twelve) hours as needed for anxiety.   flecainide (TAMBOCOR) 50 MG tablet Take 1 tablet (50 mg total) by mouth 2 (two) times daily. Keep scheduled appointment for further refills   latanoprost (XALATAN) 0.005 % ophthalmic solution 1 drop at bedtime.   levothyroxine (SYNTHROID) 75 MCG tablet Take 1 tablet (75 mcg total) by mouth daily before breakfast.   losartan-hydrochlorothiazide (HYZAAR) 100-12.5 MG tablet TAKE 1 TABLET BY MOUTH DAILY   nebivolol (BYSTOLIC) 2.5 MG tablet TAKE 1 TABLET(2.5 MG) BY MOUTH DAILY   omega-3 acid ethyl esters (LOVAZA) 1 g capsule TAKE 2 CAPSULES BY MOUTH TWICE DAILY   potassium chloride SA (KLOR-CON M) 20 MEQ tablet TAKE 1 TABLET(20 MEQ) BY MOUTH TWICE DAILY   thiamine (VITAMIN B-1) 50 MG tablet Take 1 tablet (50 mg total) by mouth daily.   VITAMIN D, CHOLECALCIFEROL, PO Take by mouth daily.   No facility-administered encounter medications on file as of 02/02/2023.    Allergies (verified) Penicillins   History: Past Medical History:  Diagnosis Date   Anxiety    Atrial fibrillation (HCC)    BPH (benign prostatic hypertrophy)    External thrombosed hemorrhoids    History of colonoscopy    HTN (hypertension)    Hx of colonic polyps    Hypothyroidism    Low HDL (under 40)  Nephrolithiasis    Past Surgical History:  Procedure Laterality Date   APPENDECTOMY     cyst tongue  1990   benign   INGUINAL HERNIA REPAIR Right 1990   INGUINAL HERNIA REPAIR Left 1948   THYROIDECTOMY, PARTIAL  1990   TONSILLECTOMY  1948   Family History  Problem Relation Age of Onset   Heart attack Father    AAA (abdominal aortic aneurysm) Father 27   Alcohol abuse Other    Breast cancer Other    Cancer Neg Hx    Early death Neg Hx    Diabetes Neg Hx    Heart disease Neg Hx    Hyperlipidemia Neg Hx    Hypertension Neg Hx    Kidney disease Neg Hx    Stroke Neg Hx    Colon cancer Neg Hx    Social History   Socioeconomic History   Marital status: Married     Spouse name: Not on file   Number of children: Not on file   Years of education: Not on file   Highest education level: Bachelor's degree (e.g., BA, AB, BS)  Occupational History   Occupation: Naval architect  Tobacco Use   Smoking status: Former    Packs/day: 2.00    Years: 10.00    Additional pack years: 0.00    Total pack years: 20.00    Types: Cigars, Cigarettes    Quit date: 2005    Years since quitting: 19.4   Smokeless tobacco: Never   Tobacco comments:    quit smoking cigars in 2017--04/15/18  Vaping Use   Vaping Use: Never used  Substance and Sexual Activity   Alcohol use: No    Alcohol/week: 0.0 standard drinks of alcohol   Drug use: No   Sexual activity: Yes  Other Topics Concern   Not on file  Social History Narrative   Not on file   Social Determinants of Health   Financial Resource Strain: Low Risk  (02/02/2023)   Overall Financial Resource Strain (CARDIA)    Difficulty of Paying Living Expenses: Not hard at all  Food Insecurity: No Food Insecurity (02/02/2023)   Hunger Vital Sign    Worried About Running Out of Food in the Last Year: Never true    Ran Out of Food in the Last Year: Never true  Transportation Needs: No Transportation Needs (02/02/2023)   PRAPARE - Administrator, Civil Service (Medical): No    Lack of Transportation (Non-Medical): No  Physical Activity: Sufficiently Active (02/02/2023)   Exercise Vital Sign    Days of Exercise per Week: 7 days    Minutes of Exercise per Session: 30 min  Recent Concern: Physical Activity - Insufficiently Active (01/28/2023)   Exercise Vital Sign    Days of Exercise per Week: 3 days    Minutes of Exercise per Session: 30 min  Stress: No Stress Concern Present (02/02/2023)   [employer] of Occupational Health - Occupational Stress Questionnaire    Feeling of Stress : Not at all  Social Connections: Socially Integrated (02/02/2023)   Social Connection and Isolation Panel [NHANES]    Frequency  of Communication with Friends and Family: More than three times a week    Frequency of Social Gatherings with Friends and Family: More than three times a week    Attends Religious Services: More than 4 times per year    Active Member of [employer] or Organizations: Yes    Attends Banker Meetings: More than  4 times per year    Marital Status: Married  Recent Concern: Social Connections - Socially Isolated (01/28/2023)   Social Connection and Isolation Panel [NHANES]    Frequency of Communication with Friends and Family: Once a week    Frequency of Social Gatherings with Friends and Family: Once a week    Attends Religious Services: Never    Database administrator or Organizations: No    Attends Engineer, structural: Never    Marital Status: Married    Tobacco Counseling Counseling given: Yes Tobacco comments: quit smoking cigars in 2017--04/15/18   Clinical Intake:  Pre-visit preparation completed: Yes  Pain : No/denies pain     BMI - recorded: 25.09 Nutritional Status: BMI 25 -29 Overweight Nutritional Risks: None Diabetes: No  How often do you need to have someone help you when you read instructions, pamphlets, or other written materials from your doctor or pharmacy?: 1 - Never  Diabetic?no   Interpreter Needed?: No  Information entered by :: Fredirick Maudlin   Activities of Daily Living    01/28/2023    5:50 PM 04/27/2022    9:08 AM  In your present state of health, do you have any difficulty performing the following activities:  Hearing? 0 1  Vision? 0 0  Difficulty concentrating or making decisions? 0 0  Walking or climbing stairs? 0 0  Dressing or bathing? 0 0  Doing errands, shopping? 0 0  Preparing Food and eating ? N N  Using the Toilet? N N  In the past six months, have you accidently leaked urine? N N  Do you have problems with loss of bowel control? N N  Managing your Medications? N N  Managing your Finances? N N  Housekeeping  or managing your Housekeeping? N N    Patient Care Team: Etta Grandchild, MD as PCP - General Jens Som, Madolyn Frieze, MD as PCP - Cardiology (Cardiology) Kathyrn Sheriff, [facility] as Pharmacist (Pharmacist) Nelson Chimes, MD as Consulting Physician (Ophthalmology)  Indicate any recent Medical Services you may have received from other than [facility] providers in the past year (date may be approximate).     Assessment:   This is a routine wellness examination for Kevin Bradley.  Hearing/Vision screen Hearing Screening - Comments:: Wears hearing aides in both ears Vision Screening - Comments:: Wears rx glasses - up to date with routine [facility] exams with  [facility]  Dietary issues and exercise activities discussed: Current Exercise Habits: Home exercise routine, Type of exercise: strength training/weights, Time (Minutes): 60, Frequency (Times/Week): 3, Weekly Exercise (Minutes/Week): 180, Intensity: Moderate, Exercise limited by: orthopedic condition(s)   Goals Addressed   None   Depression Screen    01/29/2023    9:26 AM 04/27/2022    9:06 AM 04/11/2021   10:14 AM 09/27/2020   12:29 PM 03/07/2019    9:49 AM 12/15/2017    8:45 AM 12/13/2017    3:22 PM  PHQ 2/9 Scores  PHQ - 2 Score 0 0 0 0 0 0 0  PHQ- 9 Score 0          Fall Risk    01/29/2023    9:26 AM 01/28/2023    5:50 PM 04/27/2022    9:08 AM 04/11/2021   10:16 AM 09/27/2020   12:29 PM  Fall Risk   Falls in the past year? 0 0 0 0 0  Number falls in past yr: 0  0 0 0  Injury with Fall? 0 0  0 0 0  Risk for fall due to : No Fall Risks  No Fall Risks No Fall Risks No Fall Risks  Follow up Falls evaluation completed Falls prevention discussed;Falls evaluation completed Falls evaluation completed Falls evaluation completed Falls evaluation completed    FALL RISK PREVENTION PERTAINING TO THE HOME:  Any stairs in or around the home? Yes  If so, are there any without handrails? No  Home free of loose throw rugs in walkways, pet beds,  electrical cords, etc? No  Adequate lighting in your home to reduce risk of falls? Yes   ASSISTIVE DEVICES UTILIZED TO PREVENT FALLS:  Life alert? No  Use of a cane, walker or w/c? No  Grab bars in the bathroom? Yes  Shower chair or bench in shower? No  Elevated toilet seat or a handicapped toilet? Yes   TIMED UP AND GO:  Was the test performed?  No televisit .   Cognitive Function:        02/02/2023    8:20 AM 04/27/2022    9:09 AM  6CIT Screen  What Year? 0 points 0 points  What month? 0 points 0 points  What time? 0 points 0 points  Count back from 20 0 points 0 points  Months in reverse 0 points 0 points  Repeat phrase 0 points 0 points  Total Score 0 points 0 points    Immunizations Immunization History  Administered Date(s) Administered   Fluad Quad(high Dose 65+) 05/08/2021   Influenza Whole 06/07/2009, 07/14/2010   Influenza, High Dose Seasonal PF 07/03/2017, 06/07/2018, 06/06/2019, 05/22/2022   Influenza,inj,Quad PF,6+ Mos 06/21/2020   Influenza-Unspecified 06/21/2013, 07/10/2015, 07/18/2016   PFIZER(Purple Top)SARS-COV-2 Vaccination 10/29/2019, 11/22/2019   Pneumococcal Conjugate-13 09/17/2014   Pneumococcal Polysaccharide-23 07/14/2010, 08/01/2018   Td 08/11/2010   Zoster, Live 10/16/2013    TDAP status: Due, Education has been provided regarding the importance of this vaccine. Advised may receive this vaccine at local pharmacy or Health Dept. Aware to provide a copy of the vaccination record if obtained from local pharmacy or Health Dept. Verbalized acceptance and understanding.  Flu Vaccine status: Up to date  Pneumococcal vaccine status: Up to date  Covid-19 vaccine status: Completed vaccines  Qualifies for Shingles Vaccine? Yes   Zostavax completed No   Shingrix Completed?: No.    Education has been provided regarding the importance of this vaccine. Patient has been advised to call insurance company to determine out of pocket expense if they have  not yet received this vaccine. Advised may also receive vaccine at local pharmacy or Health Dept. Verbalized acceptance and understanding.  Screening Tests Health Maintenance  Topic Date Due   Zoster Vaccines- Shingrix (1 of 2) Never done   DTaP/Tdap/Td (2 - Tdap) 08/11/2020   COVID-19 Vaccine (3 - 2023-24 season) 05/08/2022   INFLUENZA VACCINE  04/08/2023   Medicare Annual Wellness (AWV)  02/02/2024   Pneumonia Vaccine 45+ Years old  Completed   Hepatitis C Screening  Completed   HPV VACCINES  Aged Out   Colonoscopy  Discontinued    Health Maintenance  Health Maintenance Due  Topic Date Due   Zoster Vaccines- Shingrix (1 of 2) Never done   DTaP/Tdap/Td (2 - Tdap) 08/11/2020   COVID-19 Vaccine (3 - 2023-24 season) 05/08/2022    Colorectal cancer screening: Type of screening: Colonoscopy. Completed 02/12/15. Repeat every 10 years  Lung Cancer Screening: (Low Dose CT Chest recommended if Age 1-80 years, 30 pack-year currently smoking OR have quit w/in 15years.) does  not qualify.   Lung Cancer Screening Referral: No  Additional Screening:  Hepatitis C Screening: does qualify; Completed 12/14/17  Vision Screening: Recommended annual ophthalmology exams for early detection of glaucoma and other disorders of the eye. Is the patient up to date with their annual eye exam?  Yes  Who is the provider or what is the name of the office in which the patient attends annual eye exams? [facility] If pt is not established with a provider, would they like to be referred to a provider to establish care? No .   Dental Screening: Recommended annual dental exams for proper oral hygiene  Community Resource Referral / Chronic Care Management: CRR required this visit?  No   CCM required this visit?  No      Plan:     I have personally reviewed and noted the following in the patient's chart:   Medical and social history Use of alcohol, tobacco or illicit drugs  Current medications  and supplements including opioid prescriptions. Patient is not currently taking opioid prescriptions. Functional ability and status Nutritional status Physical activity Advanced directives List of other physicians Hospitalizations, surgeries, and ER visits in previous 12 months Vitals Screenings to include cognitive, depression, and falls Referrals and appointments  In addition, I have reviewed and discussed with patient certain preventive protocols, quality metrics, and best practice recommendations. A written personalized care plan for preventive services as well as general preventive health recommendations were provided to patient.     Annabell Sabal, CMA   02/02/2023   Nurse Notes: None

## 2023-02-12 ENCOUNTER — Other Ambulatory Visit: Payer: Self-pay | Admitting: Cardiology

## 2023-02-12 DIAGNOSIS — I480 Paroxysmal atrial fibrillation: Secondary | ICD-10-CM

## 2023-02-22 ENCOUNTER — Ambulatory Visit
Admission: RE | Admit: 2023-02-22 | Discharge: 2023-02-22 | Disposition: A | Discharge status: Home / Self Care | Payer: PPO | Source: Ambulatory Visit | Attending: Internal Medicine | Admitting: Internal Medicine

## 2023-02-22 DIAGNOSIS — M48061 Spinal stenosis, lumbar region without neurogenic claudication: Secondary | ICD-10-CM | POA: Diagnosis not present

## 2023-02-22 DIAGNOSIS — R29898 Other symptoms and signs involving the musculoskeletal system: Secondary | ICD-10-CM

## 2023-02-22 DIAGNOSIS — M5126 Other intervertebral disc displacement, lumbar region: Secondary | ICD-10-CM | POA: Diagnosis not present

## 2023-02-22 DIAGNOSIS — M47816 Spondylosis without myelopathy or radiculopathy, lumbar region: Secondary | ICD-10-CM | POA: Diagnosis not present

## 2023-02-22 DIAGNOSIS — M545 Low back pain, unspecified: Secondary | ICD-10-CM

## 2023-03-01 ENCOUNTER — Telehealth: Payer: Self-pay

## 2023-03-01 ENCOUNTER — Ambulatory Visit (INDEPENDENT_AMBULATORY_CARE_PROVIDER_SITE_OTHER): Payer: PPO

## 2023-03-01 DIAGNOSIS — E538 Deficiency of other specified B group vitamins: Secondary | ICD-10-CM | POA: Diagnosis not present

## 2023-03-01 MED ORDER — CYANOCOBALAMIN 1000 MCG/ML IJ SOLN
1000.0000 ug | Freq: Once | INTRAMUSCULAR | Status: AC
Start: 2023-03-01 — End: 2023-03-01
  Administered 2023-03-01: 1000 ug via INTRAMUSCULAR

## 2023-03-01 NOTE — Telephone Encounter (Signed)
Pt came in today for his last B12, he is wondering how long he has to wait to get his labs recheck

## 2023-03-01 NOTE — Progress Notes (Signed)
After obtaining consent, and per orders of Dr. Jones, injection of B12 given by Jeremaine Maraj P Danielle Mink. Patient instructed to report any adverse reaction to me immediately.  

## 2023-03-01 NOTE — Telephone Encounter (Signed)
2-3 months

## 2023-03-02 ENCOUNTER — Other Ambulatory Visit: Payer: PPO

## 2023-03-04 NOTE — Telephone Encounter (Signed)
Send pt my chart message in regards labs recheck

## 2023-03-14 ENCOUNTER — Other Ambulatory Visit: Payer: Self-pay | Admitting: Internal Medicine

## 2023-03-14 DIAGNOSIS — E039 Hypothyroidism, unspecified: Secondary | ICD-10-CM

## 2023-03-15 DIAGNOSIS — H401134 Primary open-angle glaucoma, bilateral, indeterminate stage: Secondary | ICD-10-CM | POA: Diagnosis not present

## 2023-03-15 DIAGNOSIS — H26493 Other secondary cataract, bilateral: Secondary | ICD-10-CM | POA: Diagnosis not present

## 2023-03-15 DIAGNOSIS — H04123 Dry eye syndrome of bilateral lacrimal glands: Secondary | ICD-10-CM | POA: Diagnosis not present

## 2023-03-15 DIAGNOSIS — H40053 Ocular hypertension, bilateral: Secondary | ICD-10-CM | POA: Diagnosis not present

## 2023-04-14 ENCOUNTER — Other Ambulatory Visit: Payer: Self-pay | Admitting: Internal Medicine

## 2023-04-14 DIAGNOSIS — I10 Essential (primary) hypertension: Secondary | ICD-10-CM

## 2023-04-15 ENCOUNTER — Encounter: Payer: Self-pay | Admitting: Internal Medicine

## 2023-04-19 ENCOUNTER — Ambulatory Visit (INDEPENDENT_AMBULATORY_CARE_PROVIDER_SITE_OTHER): Payer: PPO | Admitting: Internal Medicine

## 2023-04-19 ENCOUNTER — Encounter: Payer: Self-pay | Admitting: Internal Medicine

## 2023-04-19 VITALS — BP 132/76 | HR 46 | Temp 98.2°F | Resp 16 | Ht 72.0 in | Wt 183.0 lb

## 2023-04-19 DIAGNOSIS — D751 Secondary polycythemia: Secondary | ICD-10-CM

## 2023-04-19 DIAGNOSIS — I480 Paroxysmal atrial fibrillation: Secondary | ICD-10-CM

## 2023-04-19 DIAGNOSIS — M48061 Spinal stenosis, lumbar region without neurogenic claudication: Secondary | ICD-10-CM | POA: Diagnosis not present

## 2023-04-19 DIAGNOSIS — E781 Pure hyperglyceridemia: Secondary | ICD-10-CM

## 2023-04-19 DIAGNOSIS — Z0000 Encounter for general adult medical examination without abnormal findings: Secondary | ICD-10-CM | POA: Diagnosis not present

## 2023-04-19 DIAGNOSIS — D510 Vitamin B12 deficiency anemia due to intrinsic factor deficiency: Secondary | ICD-10-CM

## 2023-04-19 DIAGNOSIS — E039 Hypothyroidism, unspecified: Secondary | ICD-10-CM

## 2023-04-19 DIAGNOSIS — Z23 Encounter for immunization: Secondary | ICD-10-CM

## 2023-04-19 DIAGNOSIS — R7303 Prediabetes: Secondary | ICD-10-CM | POA: Diagnosis not present

## 2023-04-19 DIAGNOSIS — I10 Essential (primary) hypertension: Secondary | ICD-10-CM

## 2023-04-19 DIAGNOSIS — E785 Hyperlipidemia, unspecified: Secondary | ICD-10-CM | POA: Diagnosis not present

## 2023-04-19 DIAGNOSIS — Z0001 Encounter for general adult medical examination with abnormal findings: Secondary | ICD-10-CM

## 2023-04-19 LAB — HEPATIC FUNCTION PANEL
ALT: 13 U/L (ref 0–53)
AST: 16 U/L (ref 0–37)
Albumin: 4.3 g/dL (ref 3.5–5.2)
Alkaline Phosphatase: 60 U/L (ref 39–117)
Bilirubin, Direct: 0.2 mg/dL (ref 0.0–0.3)
Total Bilirubin: 1.4 mg/dL — ABNORMAL HIGH (ref 0.2–1.2)
Total Protein: 7 g/dL (ref 6.0–8.3)

## 2023-04-19 LAB — LIPID PANEL
Cholesterol: 142 mg/dL (ref 0–200)
HDL: 35.7 mg/dL — ABNORMAL LOW (ref >39.00)
LDL Cholesterol: 76 mg/dL (ref 0–99)
NonHDL: 106.48
Total CHOL/HDL Ratio: 4
Triglycerides: 153 mg/dL — ABNORMAL HIGH (ref 0.0–149.0)
VLDL: 30.6 mg/dL (ref 0.0–40.0)

## 2023-04-19 LAB — CBC WITH DIFFERENTIAL/PLATELET
Basophils Absolute: 0 10*3/uL (ref 0.0–0.1)
Basophils Relative: 0.7 % (ref 0.0–3.0)
Eosinophils Absolute: 0.2 10*3/uL (ref 0.0–0.7)
Eosinophils Relative: 2.9 % (ref 0.0–5.0)
HCT: 49.5 % (ref 39.0–52.0)
Hemoglobin: 16.6 g/dL (ref 13.0–17.0)
Lymphocytes Relative: 26.5 % (ref 12.0–46.0)
Lymphs Abs: 1.9 10*3/uL (ref 0.7–4.0)
MCHC: 33.5 g/dL (ref 30.0–36.0)
MCV: 90.3 fl (ref 78.0–100.0)
Monocytes Absolute: 0.7 10*3/uL (ref 0.1–1.0)
Monocytes Relative: 9.8 % (ref 3.0–12.0)
Neutro Abs: 4.3 10*3/uL (ref 1.4–7.7)
Neutrophils Relative %: 60.1 % (ref 43.0–77.0)
Platelets: 251 10*3/uL (ref 150.0–400.0)
RBC: 5.48 Mil/uL (ref 4.22–5.81)
RDW: 12.8 % (ref 11.5–15.5)
WBC: 7.2 10*3/uL (ref 4.0–10.5)

## 2023-04-19 LAB — BASIC METABOLIC PANEL
BUN: 19 mg/dL (ref 6–23)
CO2: 29 mEq/L (ref 19–32)
Calcium: 9.5 mg/dL (ref 8.4–10.5)
Chloride: 101 mEq/L (ref 96–112)
Creatinine, Ser: 0.89 mg/dL (ref 0.40–1.50)
GFR: 82.43 mL/min (ref >60.00)
Glucose, Bld: 107 mg/dL — ABNORMAL HIGH (ref 70–99)
Potassium: 3.7 mEq/L (ref 3.5–5.1)
Sodium: 137 mEq/L (ref 135–145)

## 2023-04-19 LAB — TSH: TSH: 2.92 u[IU]/mL (ref 0.35–5.50)

## 2023-04-19 LAB — HEMOGLOBIN A1C: Hgb A1c MFr Bld: 6.1 % (ref 4.6–6.5)

## 2023-04-19 MED ORDER — CYANOCOBALAMIN 2000 MCG PO TABS
2000.0000 ug | ORAL_TABLET | Freq: Every day | ORAL | 1 refills | Status: DC
Start: 2023-04-19 — End: 2023-10-25

## 2023-04-19 MED ORDER — ROSUVASTATIN CALCIUM 10 MG PO TABS: 10.0000 mg | ORAL_TABLET | Freq: Every day | ORAL | 1 refills | Status: DC

## 2023-04-19 MED ORDER — LOSARTAN POTASSIUM-HCTZ 100-12.5 MG PO TABS
1.0000 | ORAL_TABLET | Freq: Every day | ORAL | 1 refills | Status: DC
Start: 2023-04-19 — End: 2023-10-12

## 2023-04-19 MED ORDER — ASPIRIN 81 MG PO TBEC
81.0000 mg | DELAYED_RELEASE_TABLET | Freq: Two times a day (BID) | ORAL | 1 refills | Status: AC
Start: 2023-04-19 — End: ?

## 2023-04-19 MED ORDER — BOOSTRIX 5-2.5-18.5 LF-MCG/0.5 IM SUSP
0.5000 mL | Freq: Once | INTRAMUSCULAR | 0 refills | Status: AC
Start: 2023-04-19 — End: 2023-04-19

## 2023-04-19 MED ORDER — SHINGRIX 50 MCG/0.5ML IM SUSR
0.5000 mL | Freq: Once | INTRAMUSCULAR | 1 refills | Status: AC
Start: 2023-04-19 — End: 2023-04-19

## 2023-04-19 MED ORDER — LEVOTHYROXINE SODIUM 75 MCG PO TABS
75.0000 ug | ORAL_TABLET | Freq: Every day | ORAL | 1 refills | Status: DC
Start: 2023-04-19 — End: 2023-10-25

## 2023-04-19 NOTE — Progress Notes (Unsigned)
Subjective:  Patient ID: Kevin Bradley, male    DOB: 04-26-1946  Age: 77 y.o. MRN: [id number]  CC: Annual Exam, Atrial Fibrillation, Hypertension, Hypothyroidism, and Back Pain   HPI KAHN LEDON presents for a CPX and f/up ----  Discussed the use of AI scribe software for clinical note transcription with the patient, who gave verbal consent to proceed.  History of Present Illness   The patient, with a history of atrial fibrillation, polycythemia, and hypertension, reports experiencing palpitations, which he describes as his "normal stuff." He denies any associated dizziness, lightheadedness, or instability. He also denies any symptoms of hypotension or thyroid dysfunction such as headache, blurred vision, or changes in weight or appetite. He is currently attempting intentional weight loss and reports a weight of 183 pounds. He continues to experience tinnitus.  The patient's tongue issues have resolved since starting B12 and B1. He denies any constipation or diarrhea. His last EKG was performed approximately 6-8 months ago by his cardiologist. He reports a history of back pain and bilateral lower extremity weakness, with recent imaging revealing two bulging discs. He reports that prolonged standing exacerbates his back pain, but he can sit for extended periods without discomfort. He has had one incident of significant leg weakness since his imaging studies.  The patient also reports a low resting heart rate, which he states is his normal baseline. He is currently on a regimen of amlodipine, nebivolol, and a Hyzaar. He expresses satisfaction with his current medications. He has completed a course of B12 injections and expresses a preference for switching to oral B12 supplementation.       Outpatient Medications Prior to Visit  Medication Sig Dispense Refill   amLODipine (NORVASC) 10 MG tablet TAKE 1 TABLET(10 MG) BY MOUTH DAILY 90 tablet 3   diazepam (VALIUM) 5 MG tablet Take 1 tablet (5  mg total) by mouth every 12 (twelve) hours as needed for anxiety. 10 tablet 1   flecainide (TAMBOCOR) 50 MG tablet Take 1 tablet (50 mg total) by mouth 2 (two) times daily. Keep scheduled appointment for further refills 180 tablet 3   latanoprost (XALATAN) 0.005 % ophthalmic solution 1 drop at bedtime.     nebivolol (BYSTOLIC) 2.5 MG tablet TAKE 1 TABLET(2.5 MG) BY MOUTH DAILY 90 tablet 3   omega-3 acid ethyl esters (LOVAZA) 1 g capsule TAKE 2 CAPSULES BY MOUTH TWICE DAILY 360 capsule 1   potassium chloride SA (KLOR-CON M) 20 MEQ tablet TAKE 1 TABLET(20 MEQ) BY MOUTH TWICE DAILY 60 tablet 3   thiamine (VITAMIN B-1) 50 MG tablet Take 1 tablet (50 mg total) by mouth daily. 90 tablet 1   VITAMIN D, CHOLECALCIFEROL, PO Take by mouth daily.     aspirin 81 MG EC tablet Take 81 tablets by mouth 2 (two) times daily.     levothyroxine (SYNTHROID) 75 MCG tablet TAKE 1 TABLET(75 MCG) BY MOUTH DAILY BEFORE BREAKFAST 90 tablet 0   losartan-hydrochlorothiazide (HYZAAR) 100-12.5 MG tablet TAKE 1 TABLET BY MOUTH DAILY 90 tablet 0   No facility-administered medications prior to visit.    ROS Review of Systems  Constitutional: Negative.  Negative for chills, diaphoresis, fatigue and fever.  HENT: Negative.    Eyes: Negative.   Respiratory: Negative.  Negative for cough, chest tightness, shortness of breath and wheezing.   Cardiovascular:  Positive for palpitations. Negative for chest pain and leg swelling.  Gastrointestinal:  Negative for abdominal pain, constipation, diarrhea, nausea and vomiting.  Endocrine: Negative.  Genitourinary: Negative.   Musculoskeletal:  Positive for back pain. Negative for myalgias.  Skin: Negative.   Neurological:  Positive for weakness. Negative for dizziness and numbness.  Hematological:  Negative for adenopathy. Does not bruise/bleed easily.  Psychiatric/Behavioral: Negative.      Objective:  BP 132/76 (BP Location: Left Arm, Patient Position: Sitting, Cuff Size:  Large)   Pulse (!) 46   Temp 98.2 F (36.8 C) (Oral)   Resp 16   Ht 6' (1.829 m)   Wt 183 lb (83 kg)   SpO2 96%   BMI 24.82 kg/m   BP Readings from Last 3 Encounters:  04/19/23 132/76  01/29/23 120/68  10/16/22 118/74    Wt Readings from Last 3 Encounters:  04/19/23 183 lb (83 kg)  02/02/23 185 lb (83.9 kg)  01/29/23 185 lb (83.9 kg)    Physical Exam Vitals reviewed.  Constitutional:      Appearance: Normal appearance.  HENT:     Nose: Nose normal.     Mouth/Throat:     Mouth: Mucous membranes are moist.  Eyes:     General: No scleral icterus.    Conjunctiva/sclera: Conjunctivae normal.  Cardiovascular:     Rate and Rhythm: Regular rhythm. Bradycardia present.     Pulses: Normal pulses.     Heart sounds: No murmur heard.    No friction rub. No gallop.     Comments: EKG- SB, 48 bpm No LVH, Q waves, or ST/T waves  Pulmonary:     Effort: Pulmonary effort is normal.     Breath sounds: No stridor. No wheezing, rhonchi or rales.  Abdominal:     General: Abdomen is flat.     Palpations: There is no mass.     Tenderness: There is no abdominal tenderness. There is no guarding.     Hernia: No hernia is present.  Musculoskeletal:     Cervical back: Neck supple.     Right lower leg: No edema.     Left lower leg: No edema.  Skin:    General: Skin is warm and dry.  Neurological:     General: No focal deficit present.     Mental Status: He is alert and oriented to person, place, and time. Mental status is at baseline.  Psychiatric:        Mood and Affect: Mood normal.        Behavior: Behavior normal.     Lab Results  Component Value Date   WBC 7.2 04/19/2023   HGB 16.6 04/19/2023   HCT 49.5 04/19/2023   PLT 251.0 04/19/2023   GLUCOSE 107 (H) 04/19/2023   CHOL 142 04/19/2023   TRIG 153.0 (H) 04/19/2023   HDL 35.70 (L) 04/19/2023   LDLDIRECT 79.0 03/25/2022   LDLCALC 76 04/19/2023   ALT 13 04/19/2023   AST 16 04/19/2023   NA 137 04/19/2023   K 3.7  04/19/2023   CL 101 04/19/2023   CREATININE 0.89 04/19/2023   BUN 19 04/19/2023   CO2 29 04/19/2023   TSH 2.92 04/19/2023   PSA 3.10 03/25/2022   HGBA1C 6.1 04/19/2023    MR LUMBAR SPINE WO CONTRAST  Result Date: 03/01/2023 CLINICAL DATA:  Low back pain with bilateral leg weakness for 2 weeks EXAM: MRI LUMBAR SPINE WITHOUT CONTRAST TECHNIQUE: Multiplanar, multisequence MR imaging of the lumbar spine was performed. No intravenous contrast was administered. COMPARISON:  None Available. FINDINGS: Segmentation:  Standard. Alignment: Grade 1 anterolisthesis of L5 on S1 secondary to bilateral L5  pars interarticularis defects. Vertebrae: No acute fracture, evidence of discitis, or aggressive bone lesion. Conus medullaris and cauda equina: Conus extends to the L1-2 level. Conus and cauda equina appear normal. Paraspinal and other soft tissues: No acute paraspinal abnormality. Disc levels: Disc spaces: Degenerative disease with disc height loss at L1-2, L2-3, L3-4 and most severe at L5-S1. T12-L1: No significant disc bulge. Mild bilateral facet arthropathy. No foraminal or central canal stenosis. L1-L2: Broad-based disc bulge. Moderate bilateral facet arthropathy. Bilateral subarticular recess stenosis. Moderate bilateral foraminal stenosis. No spinal stenosis. L2-L3: Broad-based disc bulge. Moderate bilateral facet arthropathy. Mild spinal stenosis. Bilateral subarticular recess stenosis. Moderate bilateral foraminal stenosis, left greater than right. L3-L4: Broad-based disc bulge. Moderate bilateral facet arthropathy. Mild spinal stenosis. Moderate bilateral foraminal stenosis. L4-L5: No disc protrusion. Moderate bilateral facet arthropathy. Moderate right foraminal stenosis. No left foraminal stenosis. No spinal stenosis. L5-S1: No disc protrusion. Mild bilateral facet arthropathy. Severe bilateral foraminal stenosis. No spinal stenosis. IMPRESSION: 1. Lumbar spine spondylosis as described above. 2. No acute  osseous injury of the lumbar spine. Electronically Signed   By: Elige Ko M.D.   On: 03/01/2023 11:30    Assessment & Plan:   Prediabetes -     Basic metabolic panel; Future -     Hemoglobin A1c; Future  Vitamin B12 deficiency anemia due to intrinsic factor deficiency -     CBC with Differential/Platelet; Future -     Cyanocobalamin; Take 1 tablet (2,000 mcg total) by mouth daily.  Dispense: 90 tablet; Refill: 1  Hyperlipidemia with target LDL less than 100 - Will start a statin for CV risk reduction. -     Lipid panel; Future -     Hepatic function panel; Future -     TSH; Future  Hypertriglyceridemia -     Lipid panel; Future  Acquired hypothyroidism - He is euthyroid. -     TSH; Future -     Levothyroxine Sodium; Take 1 tablet (75 mcg total) by mouth daily before breakfast.  Dispense: 90 tablet; Refill: 1  Essential hypertension -     Basic metabolic panel; Future -     TSH; Future -     EKG 12-Lead -     Losartan Potassium-HCTZ; Take 1 tablet by mouth daily.  Dispense: 90 tablet; Refill: 1  Bilateral stenosis of lateral recess of lumbar spine -     Ambulatory referral to Neurosurgery  Paroxysmal atrial fibrillation (HCC)- He has good R/R control.  Encounter for general adult medical examination with abnormal findings- Exam completed, labs reviewed, vaccines reviewed and updated, cancer screenings addressed, pt ed material was given.   Polycythemia, secondary - H/H are normal. -     CBC with Differential/Platelet; Future  Need for prophylactic vaccination with combined diphtheria-tetanus-pertussis (DTP) vaccine -     Boostrix; Inject 0.5 mLs into the muscle once for 1 dose.  Dispense: 0.5 mL; Refill: 0  Need for varicella vaccine -     Shingrix; Inject 0.5 mLs into the muscle once for 1 dose.  Dispense: 0.5 mL; Refill: 1     Follow-up: Return in about 6 months (around 10/20/2023).  Sanda Linger, MD

## 2023-04-19 NOTE — Patient Instructions (Signed)
Health Maintenance, Male Adopting a healthy lifestyle and getting preventive care are important in promoting health and wellness. Ask your health care provider about: The right schedule for you to have regular tests and exams. Things you can do on your own to prevent diseases and keep yourself healthy. What should I know about diet, weight, and exercise? Eat a healthy diet  Eat a diet that includes plenty of vegetables, fruits, low-fat dairy products, and lean protein. Do not eat a lot of foods that are high in solid fats, added sugars, or sodium. Maintain a healthy weight Body mass index (BMI) is a measurement that can be used to identify possible weight problems. It estimates body fat based on height and weight. Your health care provider can help determine your BMI and help you achieve or maintain a healthy weight. Get regular exercise Get regular exercise. This is one of the most important things you can do for your health. Most adults should: Exercise for at least 150 minutes each week. The exercise should increase your heart rate and make you sweat (moderate-intensity exercise). Do strengthening exercises at least twice a week. This is in addition to the moderate-intensity exercise. Spend less time sitting. Even light physical activity can be beneficial. Watch cholesterol and blood lipids Have your blood tested for lipids and cholesterol at 78 years of age, then have this test every 5 years. You may need to have your cholesterol levels checked more often if: Your lipid or cholesterol levels are high. You are older than 78 years of age. You are at high risk for heart disease. What should I know about cancer screening? Many types of cancers can be detected early and may often be prevented. Depending on your health history and family history, you may need to have cancer screening at various ages. This may include screening for: Colorectal cancer. Prostate cancer. Skin cancer. Lung  cancer. What should I know about heart disease, diabetes, and high blood pressure? Blood pressure and heart disease High blood pressure causes heart disease and increases the risk of stroke. This is more likely to develop in people who have high blood pressure readings or are overweight. Talk with your health care provider about your target blood pressure readings. Have your blood pressure checked: Every 3-5 years if you are 18-39 years of age. Every year if you are 40 years old or older. If you are between the ages of 65 and 75 and are a current or former smoker, ask your health care provider if you should have a one-time screening for abdominal aortic aneurysm (AAA). Diabetes Have regular diabetes screenings. This checks your fasting blood sugar level. Have the screening done: Once every three years after age 45 if you are at a normal weight and have a low risk for diabetes. More often and at a younger age if you are overweight or have a high risk for diabetes. What should I know about preventing infection? Hepatitis B If you have a higher risk for hepatitis B, you should be screened for this virus. Talk with your health care provider to find out if you are at risk for hepatitis B infection. Hepatitis C Blood testing is recommended for: Everyone born from 1945 through 1965. Anyone with known risk factors for hepatitis C. Sexually transmitted infections (STIs) You should be screened each year for STIs, including gonorrhea and chlamydia, if: You are sexually active and are younger than 78 years of age. You are older than 78 years of age and your   health care provider tells you that you are at risk for this type of infection. Your sexual activity has changed since you were last screened, and you are at increased risk for chlamydia or gonorrhea. Ask your health care provider if you are at risk. Ask your health care provider about whether you are at high risk for HIV. Your health care provider  may recommend a prescription medicine to help prevent HIV infection. If you choose to take medicine to prevent HIV, you should first get tested for HIV. You should then be tested every 3 months for as long as you are taking the medicine. Follow these instructions at home: Alcohol use Do not drink alcohol if your health care provider tells you not to drink. If you drink alcohol: Limit how much you have to 0-2 drinks a day. Know how much alcohol is in your drink. In the [place] one drink equals one 12 oz bottle of beer (355 mL), one 5 oz glass of wine (148 mL), or one 1 oz glass of hard liquor (44 mL). Lifestyle Do not use any products that contain nicotine or tobacco. These products include cigarettes, chewing tobacco, and vaping devices, such as e-cigarettes. If you need help quitting, ask your health care provider. Do not use street drugs. Do not share needles. Ask your health care provider for help if you need support or information about quitting drugs. General instructions Schedule regular health, dental, and eye exams. Stay current with your vaccines. Tell your health care provider if: You often feel depressed. You have ever been abused or do not feel safe at home. Summary Adopting a healthy lifestyle and getting preventive care are important in promoting health and wellness. Follow your health care provider's instructions about healthy diet, exercising, and getting tested or screened for diseases. Follow your health care provider's instructions on monitoring your cholesterol and blood pressure. This information is not intended to replace advice given to you by your health care provider. Make sure you discuss any questions you have with your health care provider. Document Revised: 01/13/2021 Document Reviewed: 01/13/2021 Elsevier Patient Education  2024 [employer]  

## 2023-05-12 ENCOUNTER — Other Ambulatory Visit: Payer: Self-pay | Admitting: Internal Medicine

## 2023-05-12 ENCOUNTER — Other Ambulatory Visit (INDEPENDENT_AMBULATORY_CARE_PROVIDER_SITE_OTHER): Payer: PPO

## 2023-05-12 DIAGNOSIS — M48061 Spinal stenosis, lumbar region without neurogenic claudication: Secondary | ICD-10-CM

## 2023-05-12 DIAGNOSIS — Z6825 Body mass index (BMI) 25.0-25.9, adult: Secondary | ICD-10-CM | POA: Diagnosis not present

## 2023-05-12 DIAGNOSIS — M7918 Myalgia, other site: Secondary | ICD-10-CM

## 2023-05-12 DIAGNOSIS — M4310 Spondylolisthesis, site unspecified: Secondary | ICD-10-CM | POA: Diagnosis not present

## 2023-05-12 DIAGNOSIS — M609 Myositis, unspecified: Secondary | ICD-10-CM | POA: Diagnosis not present

## 2023-05-12 LAB — CK: Total CK: 84 U/L (ref 7–232)

## 2023-05-14 LAB — ALDOLASE: Aldolase: 4.5 U/L (ref <=8.1)

## 2023-05-24 DIAGNOSIS — D492 Neoplasm of unspecified behavior of bone, soft tissue, and skin: Secondary | ICD-10-CM | POA: Diagnosis not present

## 2023-05-24 DIAGNOSIS — L814 Other melanin hyperpigmentation: Secondary | ICD-10-CM | POA: Diagnosis not present

## 2023-05-24 DIAGNOSIS — Z85828 Personal history of other malignant neoplasm of skin: Secondary | ICD-10-CM | POA: Diagnosis not present

## 2023-05-24 DIAGNOSIS — Z08 Encounter for follow-up examination after completed treatment for malignant neoplasm: Secondary | ICD-10-CM | POA: Diagnosis not present

## 2023-05-24 DIAGNOSIS — L821 Other seborrheic keratosis: Secondary | ICD-10-CM | POA: Diagnosis not present

## 2023-05-24 DIAGNOSIS — D225 Melanocytic nevi of trunk: Secondary | ICD-10-CM | POA: Diagnosis not present

## 2023-05-24 DIAGNOSIS — L570 Actinic keratosis: Secondary | ICD-10-CM | POA: Diagnosis not present

## 2023-06-07 ENCOUNTER — Encounter: Payer: Self-pay | Admitting: Family Medicine

## 2023-06-07 ENCOUNTER — Ambulatory Visit: Payer: PPO | Admitting: Family Medicine

## 2023-06-07 ENCOUNTER — Ambulatory Visit (INDEPENDENT_AMBULATORY_CARE_PROVIDER_SITE_OTHER): Payer: PPO

## 2023-06-07 VITALS — BP 122/82 | HR 66 | Ht 72.0 in | Wt 184.0 lb

## 2023-06-07 DIAGNOSIS — M25571 Pain in right ankle and joints of right foot: Secondary | ICD-10-CM | POA: Diagnosis not present

## 2023-06-07 DIAGNOSIS — S93491A Sprain of other ligament of right ankle, initial encounter: Secondary | ICD-10-CM | POA: Diagnosis not present

## 2023-06-07 DIAGNOSIS — M7989 Other specified soft tissue disorders: Secondary | ICD-10-CM | POA: Diagnosis not present

## 2023-06-07 DIAGNOSIS — M7731 Calcaneal spur, right foot: Secondary | ICD-10-CM | POA: Diagnosis not present

## 2023-06-07 NOTE — Progress Notes (Signed)
   I, Stevenson Clinch, CMA acting as a scribe for Clementeen Graham, MD.  Kevin Bradley is a 77 y.o. male who presents to [facility] today for R ankle pain. Pt was previously seen by Dr. Denyse Amass in 2022 for bilat shoulder pain.  Today, pt c/o R ankle pain x 2 days. MOI: inversion injury while walking dog. Pt locates pain to lateral aspect, bruising and swelling present. Has tried rest and ice with minimal relief. Notes prior football injuries.   R ankle swelling: yes Treatments tried: rest, ice  Pertinent review of systems: No fevers or chills  Relevant historical information: Hypertension and COPD.   Exam:  BP 122/82   Pulse 66   Ht 6' (1.829 m)   Wt 184 lb (83.5 kg)   SpO2 97%   BMI 24.95 kg/m  General: Well Developed, well nourished, and in no acute distress.   MSK: Right ankle moderate swelling especially laterally with some bruising distal lateral foot and ankle. Tender to palpation at ATFL region. Nontender to palpation proximal fifth metatarsal medial ankle proximal fibular head. Range of motion intact. Strength intact patient does experience pain with resisted foot eversion. Stability exam testing moderately nondiagnostic due to guarding.  No frank laxity felt. Pulses capillary fill and sensation are intact distally.    Lab and Radiology Results  X-ray images right ankle obtained today personally and independently interpreted. No acute fractures are visible. Await formal radiology review   Assessment and Plan: 77 y.o. male with right ankle sprain thought to be ATFL sprain.  Plan for home exercise program supportive ankle brace or compression sleeve.  If not improving quickly on his own formal physical therapy would be helpful.  He like to try it on his own first. Recheck in 1 month.   PDMP not reviewed this encounter. Orders Placed This Encounter  Procedures   DG Ankle Complete Right    Standing Status:   Future    Number of  Occurrences:   1    Standing Expiration Date:   06/06/2024    Order Specific Question:   Reason for Exam (SYMPTOM  OR DIAGNOSIS REQUIRED)    Answer:   right ankle pain    Order Specific Question:   Preferred imaging location?    Answer:   [facility]   No orders of the defined types were placed in this encounter.    Discussed warning signs or symptoms. Please see discharge instructions. Patient expresses understanding.   The above documentation has been reviewed and is accurate and complete Clementeen Graham, M.D.

## 2023-06-07 NOTE — Patient Instructions (Addendum)
Thank you for coming in today.   Work on home exercises.   ASO ankle brace from [facility] or Pharmacy is good   I recommend you obtained a compression sleeve to help with your joint problems. There are many options on the market however I recommend obtaining a full ankle Body Helix compression sleeve.  You can find information (including how to appropriate measure yourself for sizing) can be found at www.[employer].ch.  Many of these products are health savings account (HSA) eligible.   You can use the compression sleeve at any time throughout the day but is most important to use while being active as well as for 2 hours post-activity.   It is appropriate to ice following activity with the compression sleeve in place.   Recheck in about 1 month.   I can add formal PT with a phone call or mychart message.   Ankle Sprain, Phase I Rehab An ankle sprain is an injury to the tissues that connect bone to bone (ligaments) in your ankle. Ankle sprains can cause stiffness, loss of motion, and loss of strength. Ask your health care provider which exercises are safe for you. Do exercises exactly as told by your provider and adjust them as directed. It is normal to feel mild stretching, pulling, tightness, or discomfort as you do these exercises. Stop right away if you feel sudden pain or your pain gets worse. Do not begin these exercises until told by your provider. Stretching and range-of-motion exercises These exercises warm up your muscles and joints. They can improve the movement and flexibility of your lower leg and ankle. They also help to relieve pain and stiffness. Gastroc and soleus stretch This exercise is also called a calf stretch. It stretches the muscles in the back of the lower leg. These muscles are the gastrocnemius, or gastroc, and the soleus. Sit on the floor with your left / right leg extended. Loop a belt or towel around the ball of your left / right foot. The ball of your  foot is on the walking surface, right under your toes. Keep your left / right ankle and foot relaxed and keep your knee straight. Use the belt or towel to pull your foot toward you. You should feel a gentle stretch behind your calf or knee in your gastroc muscle. Hold this position for __________ seconds, then release to the starting position. Repeat the exercise with your knee bent. You can put a pillow or a rolled bath towel under your knee to support it. You should feel a stretch deep in your calf in the soleus muscle or at your Achilles tendon. Repeat __________ times. Complete this exercise __________ times a day. Ankle alphabet  Sit with your left / right leg supported at the lower leg. Do not rest your foot on anything. Make sure your foot has room to move freely. Think of your left / right foot as a paintbrush. Move your foot to trace each letter of the alphabet in the air. Keep your hip and knee still while you trace. Make the letters as large as you can without feeling discomfort. Trace every letter from A to Z. Repeat __________ times. Complete this exercise __________ times a day. Strengthening exercises These exercises build strength and endurance in your ankle and lower leg. Endurance is the ability to use your muscles for a long time, even after they get tired. Ankle dorsiflexion  Secure a rubber exercise band or tube to an object, such as  a table leg, that will stay still when the band is pulled. Secure the other end around your left / right foot. Sit on the floor facing the object, with your left / right leg extended. The band or tube should be slightly tense when your foot is relaxed. Slowly bring your foot toward you, bringing the top of your foot toward your shin (dorsiflexion), and pulling the band tighter. Hold this position for __________ seconds. Slowly return your foot to the starting position. Repeat __________ times. Complete this exercise __________ times a  day. Ankle plantar flexion  Sit on the floor with your left / right leg extended. Loop a rubber exercise tube or band around the ball of your left / right foot. The ball of your foot is on the walking surface, right under your toes. Hold the ends of the band or tube in your hands. The band or tube should be slightly tense when your foot is relaxed. Slowly point your foot and toes downward to tilt the top of your foot away from your shin (plantar flexion). Hold this position for __________ seconds. Slowly return your foot to the starting position. Repeat __________ times. Complete this exercise __________ times a day. Ankle eversion  Sit on the floor with your legs straight out in front of you. Loop a rubber exercise band or tube around the ball of your left / right foot. The ball of your foot is on the walking surface, right under your toes. Hold the ends of the band in your hands or secure the band to a stable object. The band or tube should be slightly tense when your foot is relaxed. Slowly push your foot outward, away from your other leg (eversion). Hold this position for __________ seconds. Slowly return your foot to the starting position. Repeat __________ times. Complete this exercise __________ times a day. This information is not intended to replace advice given to you by your health care provider. Make sure you discuss any questions you have with your health care provider. Document Revised: 06/17/2022 Document Reviewed: 06/17/2022 Elsevier Patient Education  2024 [employer].

## 2023-06-14 DIAGNOSIS — H1132 Conjunctival hemorrhage, left eye: Secondary | ICD-10-CM | POA: Diagnosis not present

## 2023-06-28 NOTE — Progress Notes (Signed)
Right ankle shows some soft tissue swelling.  No broken bones or significant arthritis is visible.

## 2023-07-05 DIAGNOSIS — H903 Sensorineural hearing loss, bilateral: Secondary | ICD-10-CM | POA: Diagnosis not present

## 2023-07-12 ENCOUNTER — Ambulatory Visit: Payer: PPO | Admitting: Family Medicine

## 2023-07-12 NOTE — Progress Notes (Deleted)
   Rubin Payor, PhD, LAT, ATC acting as a scribe for Clementeen Graham, MD.  Kevin Bradley is a 77 y.o. male who presents to [facility] today for f/u R ankle ATFL sprain. Pt was last seen by Dr. Denyse Amass on 06/07/23 and was taught HEP and advised to use an ankle brace or compression sleeve.  Today, pt reports ***  Dx imaging: 06/07/23 R ankle XR  Pertinent review of systems: ***  Relevant historical information: ***   Exam:  There were no vitals taken for this visit. General: Well Developed, well nourished, and in no acute distress.   MSK: ***    Lab and Radiology Results No results found for this or any previous visit (from the past 72 hour(s)). No results found.     Assessment and Plan: 77 y.o. male with ***   PDMP not reviewed this encounter. No orders of the defined types were placed in this encounter.  No orders of the defined types were placed in this encounter.    Discussed warning signs or symptoms. Please see discharge instructions. Patient expresses understanding.   ***

## 2023-07-13 ENCOUNTER — Other Ambulatory Visit: Payer: Self-pay | Admitting: Internal Medicine

## 2023-07-13 DIAGNOSIS — E781 Pure hyperglyceridemia: Secondary | ICD-10-CM

## 2023-09-14 ENCOUNTER — Ambulatory Visit (INDEPENDENT_AMBULATORY_CARE_PROVIDER_SITE_OTHER): Payer: PPO | Admitting: Internal Medicine

## 2023-09-14 ENCOUNTER — Ambulatory Visit (INDEPENDENT_AMBULATORY_CARE_PROVIDER_SITE_OTHER): Payer: PPO

## 2023-09-14 ENCOUNTER — Encounter: Payer: Self-pay | Admitting: Internal Medicine

## 2023-09-14 VITALS — BP 126/74 | HR 55 | Temp 98.0°F | Ht 72.0 in | Wt 189.0 lb

## 2023-09-14 DIAGNOSIS — I10 Essential (primary) hypertension: Secondary | ICD-10-CM

## 2023-09-14 DIAGNOSIS — R062 Wheezing: Secondary | ICD-10-CM | POA: Insufficient documentation

## 2023-09-14 DIAGNOSIS — R058 Other specified cough: Secondary | ICD-10-CM | POA: Insufficient documentation

## 2023-09-14 DIAGNOSIS — R7303 Prediabetes: Secondary | ICD-10-CM | POA: Diagnosis not present

## 2023-09-14 MED ORDER — DOXYCYCLINE HYCLATE 100 MG PO TABS: 100.0000 mg | ORAL_TABLET | Freq: Two times a day (BID) | ORAL | 0 refills | Status: DC

## 2023-09-14 MED ORDER — ALBUTEROL SULFATE HFA 108 (90 BASE) MCG/ACT IN AERS: 2.0000 | INHALATION_SPRAY | Freq: Four times a day (QID) | RESPIRATORY_TRACT | 0 refills | Status: DC | PRN

## 2023-09-14 MED ORDER — PREDNISONE 10 MG PO TABS: ORAL_TABLET | ORAL | 0 refills | Status: DC

## 2023-09-14 MED ORDER — HYDROCODONE BIT-HOMATROP MBR 5-1.5 MG/5ML PO SOLN: 5.0000 mL | Freq: Four times a day (QID) | ORAL | 0 refills | Status: AC | PRN

## 2023-09-14 NOTE — Assessment & Plan Note (Signed)
 BP Readings from Last 3 Encounters:  09/14/23 126/74  06/07/23 122/82  04/19/23 132/76   Stable, pt to continue medical treatment norvasc 10 every day, hyzaar 100 12.5 every day, bystolic 2.5 qd

## 2023-09-14 NOTE — Patient Instructions (Signed)
 Please take all new medication as prescribed - the antibiotic, cough medicine, prednisone  for wheezing, and inhaler for wheezing if needed  Please continue all other medications as before, and refills have been done if requested.  Please have the pharmacy call with any other refills you may need.  Please keep your appointments with your specialists as you may have planned  Please go to the XRAY Department in the first floor for the x-ray testing  You will be contacted by phone if any changes need to be made immediately.  Otherwise, you will receive a letter about your results with an explanation, but please check with MyChart first.

## 2023-09-14 NOTE — Assessment & Plan Note (Signed)
Mild to mod, for prednisone taper asd, to f/u any worsening symptoms or concerns

## 2023-09-14 NOTE — Assessment & Plan Note (Signed)
 Lab Results  Component Value Date   HGBA1C 6.1 04/19/2023   Stable, pt to continue current medical treatment  - diet, wt control

## 2023-09-14 NOTE — Assessment & Plan Note (Signed)
 Mild to mod, for antibx course - doxycycline 100 bid, cough med prn, to f/u any worsening symptoms or concerns

## 2023-09-14 NOTE — Progress Notes (Signed)
 Patient ID: Kevin Bradley, male   DOB: [birth year], 78 y.o.   MRN: [id number]        Chief Complaint: follow up prod cough, wheezing x 1 wk, htn, preDM       HPI:  Kevin Bradley is a 78 y.o. male here with c/o  wk onset prod cough with feverish, mild wheezing sob x 1 wk, without chills and Pt denies chest pain, orthopnea, PND, increased LE swelling, palpitations, dizziness or syncope.   Pt denies polydipsia, polyuria, or new focal neuro s/s.   Pt denies wt loss, night sweats, loss of appetite, or other constitutional symptoms       Wt Readings from Last 3 Encounters:  09/14/23 189 lb (85.7 kg)  06/07/23 184 lb (83.5 kg)  04/19/23 183 lb (83 kg)   BP Readings from Last 3 Encounters:  09/14/23 126/74  06/07/23 122/82  04/19/23 132/76         Past Medical History:  Diagnosis Date   Anxiety    Atrial fibrillation (HCC)    BPH (benign prostatic hypertrophy)    External thrombosed hemorrhoids    History of colonoscopy    HTN (hypertension)    Hx of colonic polyps    Hypothyroidism    Low HDL (under 40)    Nephrolithiasis    Past Surgical History:  Procedure Laterality Date   APPENDECTOMY     cyst tongue  1990   benign   INGUINAL HERNIA REPAIR Right 1990   INGUINAL HERNIA REPAIR Left 1948   THYROIDECTOMY, PARTIAL  1990   TONSILLECTOMY  1948    reports that he quit smoking about 20 years ago. His smoking use included cigars and cigarettes. He started smoking about 30 years ago. He has a 20 pack-year smoking history. He has never used smokeless tobacco. He reports that he does not drink alcohol and does not use drugs. family history includes AAA (abdominal aortic aneurysm) (age of onset: 64) in his father; Alcohol abuse in an other family member; Breast cancer in an other family member; Heart attack in his father. Allergies  Allergen Reactions   Penicillins     REACTION: passed out   Current Outpatient Medications on File Prior to Visit  Medication Sig Dispense Refill    amLODipine  (NORVASC ) 10 MG tablet TAKE 1 TABLET(10 MG) BY MOUTH DAILY 90 tablet 3   aspirin  EC 81 MG tablet Take 1 tablet (81 mg total) by mouth 2 (two) times daily. 90 tablet 1   cyanocobalamin  2000 MCG tablet Take 1 tablet (2,000 mcg total) by mouth daily. 90 tablet 1   diazepam  (VALIUM ) 5 MG tablet Take 1 tablet (5 mg total) by mouth every 12 (twelve) hours as needed for anxiety. 10 tablet 1   flecainide  (TAMBOCOR ) 50 MG tablet Take 1 tablet (50 mg total) by mouth 2 (two) times daily. Keep scheduled appointment for further refills 180 tablet 3   latanoprost  (XALATAN ) 0.005 % ophthalmic solution 1 drop at bedtime.     levothyroxine  (SYNTHROID ) 75 MCG tablet Take 1 tablet (75 mcg total) by mouth daily before breakfast. 90 tablet 1   losartan -hydrochlorothiazide  (HYZAAR ) 100-12.5 MG tablet Take 1 tablet by mouth daily. 90 tablet 1   nebivolol  (BYSTOLIC ) 2.5 MG tablet TAKE 1 TABLET(2.5 MG) BY MOUTH DAILY 90 tablet 3   omega-3 acid ethyl esters (LOVAZA ) 1 g capsule TAKE 2 CAPSULES BY MOUTH TWICE DAILY 360 capsule 1   potassium chloride  SA (KLOR-CON  M) 20 MEQ tablet TAKE 1  TABLET(20 MEQ) BY MOUTH TWICE DAILY 60 tablet 3   rosuvastatin  (CRESTOR ) 10 MG tablet Take 1 tablet (10 mg total) by mouth daily. 90 tablet 1   thiamine  (VITAMIN B-1) 50 MG tablet Take 1 tablet (50 mg total) by mouth daily. 90 tablet 1   VITAMIN D , CHOLECALCIFEROL , PO Take by mouth daily.     No current facility-administered medications on file prior to visit.        ROS:  All others reviewed and negative.  Objective        PE:  BP 126/74 (BP Location: Right Arm, Patient Position: Sitting, Cuff Size: Normal)   Pulse (!) 55   Temp 98 F (36.7 C) (Oral)   Ht 6' (1.829 m)   Wt 189 lb (85.7 kg)   SpO2 93%   BMI 25.63 kg/m                 Constitutional: Pt appears mild ill               HENT: Head: NCAT.                Right Ear: External ear normal.                 Left Ear: External ear normal. Bilat tm's with mild  erythema.  Max sinus areas mild tender.  Pharynx with mild erythema, no exudate               Eyes: . Pupils are equal, round, and reactive to light. Conjunctivae and EOM are normal               Nose: without d/c or deformity               Neck: Neck supple. Gross normal ROM               Cardiovascular: Normal rate and regular rhythm.                 Pulmonary/Chest: Effort normal and breath sounds without rales but few mild bilateral wheezing.                               Neurological: Pt is alert. At baseline orientation, motor grossly intact               Skin: Skin is warm. No rashes, no other new lesions, LE edema - none               Psychiatric: Pt behavior is normal without agitation   Micro: none  Cardiac tracings I have personally interpreted today:  none  Pertinent Radiological findings (summarize): none   Lab Results  Component Value Date   WBC 7.2 04/19/2023   HGB 16.6 04/19/2023   HCT 49.5 04/19/2023   PLT 251.0 04/19/2023   GLUCOSE 107 (H) 04/19/2023   CHOL 142 04/19/2023   TRIG 153.0 (H) 04/19/2023   HDL 35.70 (L) 04/19/2023   LDLDIRECT 79.0 03/25/2022   LDLCALC 76 04/19/2023   ALT 13 04/19/2023   AST 16 04/19/2023   NA 137 04/19/2023   K 3.7 04/19/2023   CL 101 04/19/2023   CREATININE 0.89 04/19/2023   BUN 19 04/19/2023   CO2 29 04/19/2023   TSH 2.92 04/19/2023   PSA 3.10 03/25/2022   HGBA1C 6.1 04/19/2023   Assessment/Plan:  Kevin Bradley is a 78 y.o. White or Caucasian [1] male with  has a past medical history of Anxiety, Atrial fibrillation (HCC), BPH (benign prostatic hypertrophy), External thrombosed hemorrhoids, History of colonoscopy, HTN (hypertension), colonic polyps, Hypothyroidism, Low HDL (under 40), and Nephrolithiasis.  Wheezing Mild to mod, for prednisone  taper asd,  to f/u any worsening symptoms or concerns  Productive cough Mild to mod, for antibx course doxycycline  100 bid, cough med prn,  to f/u any worsening symptoms or  concerns  Essential hypertension BP Readings from Last 3 Encounters:  09/14/23 126/74  06/07/23 122/82  04/19/23 132/76   Stable, pt to continue medical treatment norvasc  10 every day, hyzaar  100 12.5 every day, bystolic  2.5 qd   Prediabetes Lab Results  Component Value Date   HGBA1C 6.1 04/19/2023   Stable, pt to continue current medical treatment  - diet, wt control  Followup: Return if symptoms worsen or fail to improve.  Lynwood Rush, MD 09/14/2023 1:38 PM [facility]

## 2023-10-10 ENCOUNTER — Other Ambulatory Visit: Payer: Self-pay | Admitting: Internal Medicine

## 2023-10-10 DIAGNOSIS — I10 Essential (primary) hypertension: Secondary | ICD-10-CM

## 2023-10-12 ENCOUNTER — Other Ambulatory Visit: Payer: Self-pay | Admitting: Internal Medicine

## 2023-10-12 DIAGNOSIS — I10 Essential (primary) hypertension: Secondary | ICD-10-CM

## 2023-10-25 ENCOUNTER — Ambulatory Visit (INDEPENDENT_AMBULATORY_CARE_PROVIDER_SITE_OTHER): Payer: PPO | Admitting: Internal Medicine

## 2023-10-25 ENCOUNTER — Encounter: Payer: Self-pay | Admitting: Internal Medicine

## 2023-10-25 VITALS — BP 120/68 | HR 55 | Temp 97.9°F | Ht 72.0 in | Wt 186.8 lb

## 2023-10-25 DIAGNOSIS — R001 Bradycardia, unspecified: Secondary | ICD-10-CM | POA: Diagnosis not present

## 2023-10-25 DIAGNOSIS — R7303 Prediabetes: Secondary | ICD-10-CM | POA: Diagnosis not present

## 2023-10-25 DIAGNOSIS — N400 Enlarged prostate without lower urinary tract symptoms: Secondary | ICD-10-CM | POA: Diagnosis not present

## 2023-10-25 DIAGNOSIS — D510 Vitamin B12 deficiency anemia due to intrinsic factor deficiency: Secondary | ICD-10-CM

## 2023-10-25 DIAGNOSIS — E039 Hypothyroidism, unspecified: Secondary | ICD-10-CM | POA: Diagnosis not present

## 2023-10-25 DIAGNOSIS — E785 Hyperlipidemia, unspecified: Secondary | ICD-10-CM

## 2023-10-25 DIAGNOSIS — I10 Essential (primary) hypertension: Secondary | ICD-10-CM

## 2023-10-25 LAB — LIPID PANEL
Cholesterol: 166 mg/dL (ref 0–200)
HDL: 32.4 mg/dL — ABNORMAL LOW (ref >39.00)
LDL Cholesterol: 98 mg/dL (ref 0–99)
NonHDL: 133.37
Total CHOL/HDL Ratio: 5
Triglycerides: 177 mg/dL — ABNORMAL HIGH (ref 0.0–149.0)
VLDL: 35.4 mg/dL (ref 0.0–40.0)

## 2023-10-25 LAB — FOLATE: Folate: 12.3 ng/mL (ref >5.9)

## 2023-10-25 LAB — CBC WITH DIFFERENTIAL/PLATELET
Basophils Absolute: 0 10*3/uL (ref 0.0–0.1)
Basophils Relative: 0.5 % (ref 0.0–3.0)
Eosinophils Absolute: 0.2 10*3/uL (ref 0.0–0.7)
Eosinophils Relative: 2.8 % (ref 0.0–5.0)
HCT: 48.8 % (ref 39.0–52.0)
Hemoglobin: 16.6 g/dL (ref 13.0–17.0)
Lymphocytes Relative: 29.1 % (ref 12.0–46.0)
Lymphs Abs: 2.2 10*3/uL (ref 0.7–4.0)
MCHC: 34 g/dL (ref 30.0–36.0)
MCV: 90.5 fL (ref 78.0–100.0)
Monocytes Absolute: 0.6 10*3/uL (ref 0.1–1.0)
Monocytes Relative: 8.2 % (ref 3.0–12.0)
Neutro Abs: 4.5 10*3/uL (ref 1.4–7.7)
Neutrophils Relative %: 59.4 % (ref 43.0–77.0)
Platelets: 258 10*3/uL (ref 150.0–400.0)
RBC: 5.39 Mil/uL (ref 4.22–5.81)
RDW: 13.1 % (ref 11.5–15.5)
WBC: 7.6 10*3/uL (ref 4.0–10.5)

## 2023-10-25 LAB — BASIC METABOLIC PANEL
BUN: 18 mg/dL (ref 6–23)
CO2: 31 meq/L (ref 19–32)
Calcium: 9.3 mg/dL (ref 8.4–10.5)
Chloride: 101 meq/L (ref 96–112)
Creatinine, Ser: 0.94 mg/dL (ref 0.40–1.50)
GFR: 77.7 mL/min (ref >60.00)
Glucose, Bld: 107 mg/dL — ABNORMAL HIGH (ref 70–99)
Potassium: 4.3 meq/L (ref 3.5–5.1)
Sodium: 140 meq/L (ref 135–145)

## 2023-10-25 LAB — TSH: TSH: 2.93 u[IU]/mL (ref 0.35–5.50)

## 2023-10-25 LAB — HEMOGLOBIN A1C: Hgb A1c MFr Bld: 6.1 % (ref 4.6–6.5)

## 2023-10-25 LAB — PSA: PSA: 4.03 ng/mL — ABNORMAL HIGH (ref 0.10–4.00)

## 2023-10-25 LAB — VITAMIN B12: Vitamin B-12: 1531 pg/mL — ABNORMAL HIGH (ref 211–911)

## 2023-10-25 MED ORDER — LEVOTHYROXINE SODIUM 75 MCG PO TABS: 75.0000 ug | ORAL_TABLET | Freq: Every day | ORAL | 1 refills | Status: DC

## 2023-10-25 MED ORDER — ROSUVASTATIN CALCIUM 10 MG PO TABS: 10.0000 mg | ORAL_TABLET | Freq: Every day | ORAL | 1 refills | Status: DC

## 2023-10-25 NOTE — Patient Instructions (Signed)
 Bradycardia, Adult Bradycardia is a slower-than-normal heartbeat. A normal resting heart rate for an adult ranges from 60 to 100 beats per minute. With bradycardia, the resting heart rate is less than 60 beats per minute. Bradycardia can prevent enough oxygen from reaching certain areas of your body when you are active. It can be serious if it keeps enough oxygen from reaching your brain and other parts of your body. Bradycardia is not a problem for everyone. For some healthy adults, a slow resting heart rate is normal. What are the causes? This condition may be caused by: A problem with the heart, including: A problem with the heart's electrical system, such as a heart block. With a heart block, electrical signals between the chambers of the heart are partially or completely blocked, so they are not able to work as they should. A problem with the heart's natural pacemaker (sinus node). Heart disease. A heart attack. Heart damage. Lyme disease. A heart infection. A heart condition that is present at birth (congenital heart defect). Certain medicines that treat heart conditions. Certain conditions, such as hypothyroidism and obstructive sleep apnea. Problems with the balance of chemicals and other substances, like potassium, in the blood. Trauma. Radiation therapy. What increases the risk? You are more likely to develop this condition if you: Are age 51 or older. Have high blood pressure (hypertension), high cholesterol (hyperlipidemia), or diabetes. Drink heavily, use tobacco or nicotine products, or use drugs. What are the signs or symptoms? Symptoms of this condition include: Light-headedness. Feeling faint or fainting. Fatigue and weakness. Trouble with activity or exercise. Shortness of breath. Chest pain (angina). Drowsiness. Confusion. Dizziness. How is this diagnosed? This condition may be diagnosed based on: Your symptoms. Your medical history. A physical exam. During  the exam, your health care provider will listen to your heartbeat and check your pulse. To confirm the diagnosis, your health care provider may order tests, such as: Blood tests. An electrocardiogram (ECG). This test records the heart's electrical activity. The test can show how fast your heart is beating and whether the heartbeat is steady. A test in which you wear a portable device (event recorder or Holter monitor) to record your heart's electrical activity while you go about your day. An exercise test. How is this treated? Treatment for this condition depends on the cause of the condition and how severe your symptoms are. Treatment may involve: Treatment of the underlying condition. Changing your medicines or how much medicine you take. Having a small, battery-operated device called a pacemaker implanted under the skin. When bradycardia occurs, this device can be used to increase your heart rate and help your heart beat in a regular rhythm. Follow these instructions at home: Lifestyle Manage any health conditions that contribute to bradycardia as told by your health care provider. Follow a heart-healthy diet. A nutrition specialist (dietitian) can help educate you about healthy food options and changes. Follow an exercise program that is approved by your health care provider. Maintain a healthy weight. Try to reduce or manage your stress, such as with yoga or meditation. If you need help reducing stress, ask your health care provider. Do not use any products that contain nicotine or tobacco. These products include cigarettes, chewing tobacco, and vaping devices, such as e-cigarettes. If you need help quitting, ask your health care provider. Do not use illegal drugs. Alcohol use If you drink alcohol: Limit how much you have to: 0-1 drink a day for women who are not pregnant. 0-2 drinks a day  for men. Know how much alcohol is in a drink. In the [place] one drink equals one 12 oz bottle of  beer (355 mL), one 5 oz glass of wine (148 mL), or one 1 oz glass of hard liquor (44 mL). General instructions Take over-the-counter and prescription medicines only as told by your health care provider. Keep all follow-up visits. This is important. How is this prevented? In some cases, bradycardia may be prevented by: Treating underlying medical problems. Stopping behaviors or medicines that can trigger the condition. Contact a health care provider if: You feel light-headed or dizzy. You almost faint. You feel weak or are easily fatigued during physical activity. You experience confusion or have memory problems. Get help right away if: You faint. You have chest pains or an irregular heartbeat (palpitations). You have trouble breathing. These symptoms may represent a serious problem that is an emergency. Do not wait to see if the symptoms will go away. Get medical help right away. Call your local emergency services (911 in the [place] Do not drive yourself to the hospital. Summary Bradycardia is a slower-than-normal heartbeat. With bradycardia, the resting heart rate is less than 60 beats per minute. Treatment for this condition depends on the cause. Manage any health conditions that contribute to bradycardia as told by your health care provider. Do not use any products that contain nicotine or tobacco. These products include cigarettes, chewing tobacco, and vaping devices, such as e-cigarettes. Keep all follow-up visits. This is important. This information is not intended to replace advice given to you by your health care provider. Make sure you discuss any questions you have with your health care provider. Document Revised: 12/15/2020 Document Reviewed: 12/15/2020 Elsevier Patient Education  2024 [employer].

## 2023-10-25 NOTE — Progress Notes (Unsigned)
Subjective:  Patient ID: Kevin Bradley, male    DOB: 1946/07/10  Age: 78 y.o. MRN: [id number]  CC: Hypertension, Hypothyroidism, and Cough   HPI Kevin Bradley presents for f/up -----  Discussed the use of AI scribe software for clinical note transcription with the patient, who gave verbal consent to proceed.  History of Present Illness   FLOYDE Bradley is a 78 year old male who presents with a persistent cough and low heart rate.  He has had a persistent cough for approximately one month, occasionally producing white sputum without yellow or green sputum. No shortness of breath, but some wheezing is present. He uses an albuterol inhaler as needed. He has a history of bronchitis but denies chronic lung diseases such as COPD, emphysema, or asthma. He has not been taking doxycycline or prednisone recently.  He reports a low heart rate, which has been a long-standing issue without symptoms such as dizziness, lightheadedness, or palpitations. He recalls a previous abnormal thyroid level check but feels his current thyroid medication dose is adequate. No symptoms of fatigue, constipation, weight changes, or swelling in the legs or feet.  He is on blood pressure medication and monitors his blood pressure at home, noting readings typically between 126 to 134. His blood pressure was recently measured at 118/68, which he was informed is slightly low. He has a history of receiving B12 injections for six months and is now taking B12 tablets.       Outpatient Medications Prior to Visit  Medication Sig Dispense Refill   amLODipine (NORVASC) 10 MG tablet TAKE 1 TABLET(10 MG) BY MOUTH DAILY 90 tablet 3   aspirin EC 81 MG tablet Take 1 tablet (81 mg total) by mouth 2 (two) times daily. 90 tablet 1   diazepam (VALIUM) 5 MG tablet Take 1 tablet (5 mg total) by mouth every 12 (twelve) hours as needed for anxiety. 10 tablet 1   flecainide (TAMBOCOR) 50 MG tablet Take 1 tablet (50 mg total) by mouth 2  (two) times daily. Keep scheduled appointment for further refills 180 tablet 3   latanoprost (XALATAN) 0.005 % ophthalmic solution 1 drop at bedtime.     omega-3 acid ethyl esters (LOVAZA) 1 g capsule TAKE 2 CAPSULES BY MOUTH TWICE DAILY 360 capsule 1   potassium chloride SA (KLOR-CON M) 20 MEQ tablet TAKE 1 TABLET(20 MEQ) BY MOUTH TWICE DAILY 180 tablet 0   thiamine (VITAMIN B-1) 50 MG tablet Take 1 tablet (50 mg total) by mouth daily. 90 tablet 1   VITAMIN D, CHOLECALCIFEROL, PO Take by mouth daily.     albuterol (VENTOLIN HFA) 108 (90 Base) MCG/ACT inhaler Inhale 2 puffs into the lungs every 6 (six) hours as needed for wheezing or shortness of breath. 8 g 0   cyanocobalamin 2000 MCG tablet Take 1 tablet (2,000 mcg total) by mouth daily. 90 tablet 1   doxycycline (VIBRA-TABS) 100 MG tablet Take 1 tablet (100 mg total) by mouth 2 (two) times daily. 20 tablet 0   levothyroxine (SYNTHROID) 75 MCG tablet Take 1 tablet (75 mcg total) by mouth daily before breakfast. 90 tablet 1   losartan-hydrochlorothiazide (HYZAAR) 100-12.5 MG tablet TAKE 1 TABLET BY MOUTH DAILY 90 tablet 0   nebivolol (BYSTOLIC) 2.5 MG tablet TAKE 1 TABLET(2.5 MG) BY MOUTH DAILY 90 tablet 3   predniSONE (DELTASONE) 10 MG tablet 3 tabs by mouth per day for 3 days,2tabs per day for 3 days,1tab per day for 3 days 18 tablet  0   rosuvastatin (CRESTOR) 10 MG tablet Take 1 tablet (10 mg total) by mouth daily. 90 tablet 1   No facility-administered medications prior to visit.    ROS Review of Systems  Constitutional:  Negative for appetite change, diaphoresis, fatigue and unexpected weight change.  HENT: Negative.  Negative for trouble swallowing.   Eyes: Negative.   Respiratory:  Positive for cough. Negative for chest tightness, shortness of breath and wheezing.   Cardiovascular:  Negative for chest pain, palpitations and leg swelling.  Gastrointestinal:  Negative for abdominal pain, constipation, diarrhea, nausea and vomiting.   Endocrine: Negative.   Genitourinary: Negative.  Negative for difficulty urinating and dysuria.  Musculoskeletal: Negative.  Negative for arthralgias and myalgias.  Skin: Negative.   Neurological: Negative.  Negative for dizziness, syncope, weakness and light-headedness.  Hematological:  Negative for adenopathy.  Psychiatric/Behavioral:  Positive for confusion and decreased concentration. The patient is not nervous/anxious.     Objective:  BP 120/68 (BP Location: Left Arm, Patient Position: Sitting, Cuff Size: Normal)   Pulse (!) 55   Temp 97.9 F (36.6 C) (Oral)   Ht 6' (1.829 m)   Wt 186 lb 12.8 oz (84.7 kg)   SpO2 97%   BMI 25.33 kg/m   BP Readings from Last 3 Encounters:  10/25/23 120/68  09/14/23 126/74  06/07/23 122/82    Wt Readings from Last 3 Encounters:  10/25/23 186 lb 12.8 oz (84.7 kg)  09/14/23 189 lb (85.7 kg)  06/07/23 184 lb (83.5 kg)    Physical Exam Vitals reviewed.  Constitutional:      Appearance: Normal appearance.  HENT:     Mouth/Throat:     Mouth: Mucous membranes are moist.  Eyes:     General: No scleral icterus.    Conjunctiva/sclera: Conjunctivae normal.  Cardiovascular:     Rate and Rhythm: Bradycardia present.     Heart sounds: No murmur heard.    No friction rub. No gallop.     Comments: EKG-  SB, 50 bpm No LVH, Q waves, or ST/T wave changes  Unchanged  Pulmonary:     Effort: Pulmonary effort is normal.     Breath sounds: No stridor. No wheezing, rhonchi or rales.  Abdominal:     General: Abdomen is flat.     Palpations: There is no mass.     Tenderness: There is no abdominal tenderness. There is no guarding.     Hernia: No hernia is present.  Musculoskeletal:        General: Normal range of motion.     Cervical back: Neck supple.     Right lower leg: No edema.     Left lower leg: No edema.  Skin:    General: Skin is warm and dry.     Findings: No rash.  Neurological:     General: No focal deficit present.      Mental Status: He is alert. Mental status is at baseline.  Psychiatric:        Mood and Affect: Mood normal.        Behavior: Behavior normal.     Lab Results  Component Value Date   WBC 7.6 10/25/2023   HGB 16.6 10/25/2023   HCT 48.8 10/25/2023   PLT 258.0 10/25/2023   GLUCOSE 107 (H) 10/25/2023   CHOL 166 10/25/2023   TRIG 177.0 (H) 10/25/2023   HDL 32.40 (L) 10/25/2023   LDLDIRECT 79.0 03/25/2022   LDLCALC 98 10/25/2023   ALT 13 04/19/2023  AST 16 04/19/2023   NA 140 10/25/2023   K 4.3 10/25/2023   CL 101 10/25/2023   CREATININE 0.94 10/25/2023   BUN 18 10/25/2023   CO2 31 10/25/2023   TSH 2.93 10/25/2023   PSA 4.03 (H) 10/25/2023   HGBA1C 6.1 10/25/2023    MR LUMBAR SPINE WO CONTRAST Result Date: 03/01/2023 CLINICAL DATA:  Low back pain with bilateral leg weakness for 2 weeks EXAM: MRI LUMBAR SPINE WITHOUT CONTRAST TECHNIQUE: Multiplanar, multisequence MR imaging of the lumbar spine was performed. No intravenous contrast was administered. COMPARISON:  None Available. FINDINGS: Segmentation:  Standard. Alignment: Grade 1 anterolisthesis of L5 on S1 secondary to bilateral L5 pars interarticularis defects. Vertebrae: No acute fracture, evidence of discitis, or aggressive bone lesion. Conus medullaris and cauda equina: Conus extends to the L1-2 level. Conus and cauda equina appear normal. Paraspinal and other soft tissues: No acute paraspinal abnormality. Disc levels: Disc spaces: Degenerative disease with disc height loss at L1-2, L2-3, L3-4 and most severe at L5-S1. T12-L1: No significant disc bulge. Mild bilateral facet arthropathy. No foraminal or central canal stenosis. L1-L2: Broad-based disc bulge. Moderate bilateral facet arthropathy. Bilateral subarticular recess stenosis. Moderate bilateral foraminal stenosis. No spinal stenosis. L2-L3: Broad-based disc bulge. Moderate bilateral facet arthropathy. Mild spinal stenosis. Bilateral subarticular recess stenosis. Moderate  bilateral foraminal stenosis, left greater than right. L3-L4: Broad-based disc bulge. Moderate bilateral facet arthropathy. Mild spinal stenosis. Moderate bilateral foraminal stenosis. L4-L5: No disc protrusion. Moderate bilateral facet arthropathy. Moderate right foraminal stenosis. No left foraminal stenosis. No spinal stenosis. L5-S1: No disc protrusion. Mild bilateral facet arthropathy. Severe bilateral foraminal stenosis. No spinal stenosis. IMPRESSION: 1. Lumbar spine spondylosis as described above. 2. No acute osseous injury of the lumbar spine. Electronically Signed   By: Elige Ko M.D.   On: 03/01/2023 11:30   DG Chest 2 View Result Date: 09/20/2023 CLINICAL DATA:  Productive cough and wheezing. EXAM: CHEST - 2 VIEW COMPARISON:  09/26/2012 FINDINGS: Heart size is normal. There is minimal perihilar peribronchial thickening. There are no focal consolidations. No pleural effusion. Mild midthoracic spondylosis. IMPRESSION: Minimal perihilar peribronchial thickening.  No acute consolidation. Electronically Signed   By: Norva Pavlov M.D.   On: 09/20/2023 09:15     Assessment & Plan:   Acquired hypothyroidism- He is euthyroid. -     TSH; Future  Essential hypertension -     Basic metabolic panel; Future -     AMB Referral VBCI Care Management  Prediabetes -     Basic metabolic panel; Future -     Hemoglobin A1c; Future  Hyperlipidemia with target LDL less than 100- LDL goal achieved. Doing well on the statin  -     Lipid panel; Future -     TSH; Future -     Rosuvastatin Calcium; Take 1 tablet (10 mg total) by mouth daily.  Dispense: 90 tablet; Refill: 1  Vitamin B12 deficiency anemia due to intrinsic factor deficiency -     CBC with Differential/Platelet; Future -     Folate; Future -     Vitamin B12; Future  Bradycardia- Will discontinue the BB. -     EKG 12-Lead  BPH without obstruction/lower urinary tract symptoms -     PSA; Future     Follow-up: Return in about 6  months (around 04/23/2024).  Sanda Linger, MD

## 2023-10-27 ENCOUNTER — Encounter: Payer: Self-pay | Admitting: Internal Medicine

## 2023-11-09 ENCOUNTER — Telehealth: Payer: Self-pay

## 2023-11-09 NOTE — Progress Notes (Signed)
 Care Guide Pharmacy Note  11/09/2023 Name: Kevin Bradley MRN: [id number] DOB: 1946-05-09  Referred By: Etta Grandchild, MD Reason for referral: Care Coordination (Outreach to schedule with Pharm d )   Kevin Bradley is a 78 y.o. year old male who is a primary care patient of Etta Grandchild, MD.  [name] was referred to the pharmacist for assistance related to: HTN  Successful contact was made with the patient to discuss pharmacy services including being ready for the pharmacist to call at least 5 minutes before the scheduled appointment time and to have medication bottles and any blood pressure readings ready for review. The patient agreed to meet with the pharmacist via telephone visit on (date/time).11/23/2023  Penne Lash , RMA     [place]  [facility] Guide  Direct Dial: [phone]  Website: [email or web address]

## 2023-11-15 ENCOUNTER — Other Ambulatory Visit: Payer: Self-pay

## 2023-11-15 MED ORDER — FLECAINIDE ACETATE 50 MG PO TABS: 50.0000 mg | ORAL_TABLET | Freq: Two times a day (BID) | ORAL | 1 refills | Status: DC

## 2023-11-23 ENCOUNTER — Other Ambulatory Visit

## 2023-11-29 DIAGNOSIS — H43813 Vitreous degeneration, bilateral: Secondary | ICD-10-CM | POA: Diagnosis not present

## 2023-11-29 DIAGNOSIS — H40053 Ocular hypertension, bilateral: Secondary | ICD-10-CM | POA: Diagnosis not present

## 2023-11-29 DIAGNOSIS — Z961 Presence of intraocular lens: Secondary | ICD-10-CM | POA: Diagnosis not present

## 2023-11-29 DIAGNOSIS — H43393 Other vitreous opacities, bilateral: Secondary | ICD-10-CM | POA: Diagnosis not present

## 2023-12-07 DIAGNOSIS — H40053 Ocular hypertension, bilateral: Secondary | ICD-10-CM | POA: Diagnosis not present

## 2023-12-08 ENCOUNTER — Other Ambulatory Visit

## 2023-12-13 ENCOUNTER — Other Ambulatory Visit (INDEPENDENT_AMBULATORY_CARE_PROVIDER_SITE_OTHER): Admitting: Pharmacist

## 2023-12-13 DIAGNOSIS — I10 Essential (primary) hypertension: Secondary | ICD-10-CM

## 2023-12-13 DIAGNOSIS — R7303 Prediabetes: Secondary | ICD-10-CM

## 2023-12-13 NOTE — Patient Instructions (Signed)
 It was a pleasure speaking with you today!  Continue your current medication regimen and follow up with cardiologist.   Feel free to call with any questions or concerns!  Arbutus Leas, PharmD, BCPS, CPP Clinical Pharmacist Practitioner  Primary [facility] at [facility] [phone]

## 2023-12-13 NOTE — Progress Notes (Signed)
 12/13/2023 Name: Kevin Bradley MRN: [id number] DOB: 08-14-1946  Chief Complaint  Patient presents with   Hypertension   Medication Management    Kevin Bradley is a 78 y.o. year old male who presented for a telephone visit.   They were referred to the pharmacist by their PCP for assistance in managing hypertension.    Subjective:  Care Team: Primary Care Provider: Etta Grandchild, MD ; Next Scheduled Visit: not scheduled Cardiologist: Dr. Jens Som; Next Scheduled Visit: 02/10/2024  Medication Access/Adherence  Current Pharmacy:  [id number] - [place], [place] - [address] AT [facility] [address] [place] [address] Phone: [phone] Fax: [phone]   Patient reports affordability concerns with their medications: No  Patient reports access/transportation concerns to their pharmacy: No  Patient reports adherence concerns with their medications:  No     Hypertension:  Current medications: amlodipine 10 mg daily, losartan/hydrochlorothiazide 100/12.5 mg daily, nebivolol 2.5 mg daily *PCP note shows he d/c'd losartan/hydrochlorothiazide and nebivolol at last visit however pt was not aware and has continued taking these. He notes nebivolol is prescribed by his cardiologist for afib  Patient has a validated, automated, upper arm home BP cuff Current blood pressure readings readings:  BP at home 110-130s/80s  BP while on the phone 139/88  Patient denies hypotensive s/sx including dizziness, lightheadedness.  Patient denies hypertensive symptoms including headache, chest pain, shortness of breath   Pt notes he feels well with no concerning symptoms for low or high blood pressure. He prefers to continue his current regimen since his BP does go up to the 130s, sometimes 140s at home  Objective:  Lab Results  Component Value Date   HGBA1C 6.1 10/25/2023    Lab Results  Component Value Date   CREATININE  0.94 10/25/2023   BUN 18 10/25/2023   NA 140 10/25/2023   K 4.3 10/25/2023   CL 101 10/25/2023   CO2 31 10/25/2023    Lab Results  Component Value Date   CHOL 166 10/25/2023   HDL 32.40 (L) 10/25/2023   LDLCALC 98 10/25/2023   LDLDIRECT 79.0 03/25/2022   TRIG 177.0 (H) 10/25/2023   CHOLHDL 5 10/25/2023    Medications Reviewed Today     Reviewed by Bonita Quin, RPH (Pharmacist) on 12/13/23 at 1447  Med List Status: <None>   Medication Order Taking? Sig Documenting Provider Last Dose Status Informant  amLODipine (NORVASC) 10 MG tablet [id number] Yes TAKE 1 TABLET(10 MG) BY MOUTH DAILY Jens Som, Madolyn Frieze, MD Taking Active   aspirin EC 81 MG tablet [id number] Yes Take 1 tablet (81 mg total) by mouth 2 (two) times daily. Etta Grandchild, MD Taking Active   diazepam (VALIUM) 5 MG tablet [id number]  Take 1 tablet (5 mg total) by mouth every 12 (twelve) hours as needed for anxiety. Etta Grandchild, MD  Active   flecainide (TAMBOCOR) 50 MG tablet [id number] Yes Take 1 tablet (50 mg total) by mouth 2 (two) times daily. Lewayne Bunting, MD Taking Active   latanoprost (XALATAN) 0.005 % ophthalmic solution [id number]  1 drop at bedtime. [provider]  Active   levothyroxine (SYNTHROID) 75 MCG tablet [id number] Yes Take 1 tablet (75 mcg total) by mouth daily before breakfast. Etta Grandchild, MD Taking Active   losartan-hydrochlorothiazide [facility]) 100-12.5 MG tablet [id number] Yes Take 1 tablet by mouth daily. [provider] Taking Active   nebivolol (BYSTOLIC) 2.5 MG tablet  [id number] Yes Take 2.5 mg by mouth daily. Lewayne Bunting, MD Taking Active   omega-3 acid ethyl esters (LOVAZA) 1 g capsule [id number] Yes TAKE 2 CAPSULES BY MOUTH TWICE DAILY Etta Grandchild, MD Taking Active   potassium chloride SA (KLOR-CON M) 20 MEQ tablet [id number] Yes TAKE 1 TABLET(20 MEQ) BY MOUTH TWICE DAILY Etta Grandchild, MD Taking Active   rosuvastatin (CRESTOR) 10 MG tablet [id number] Yes  Take 1 tablet (10 mg total) by mouth daily. Etta Grandchild, MD Taking Active   thiamine (VITAMIN B-1) 50 MG tablet [id number] Yes Take 1 tablet (50 mg total) by mouth daily. Etta Grandchild, MD Taking Active   VITAMIN D, CHOLECALCIFEROL, PO [id number] Yes Take by mouth daily. [provider] Taking Active               Assessment/Plan:   Hypertension: - Currently borderline controlled, BP goal <130/80 - Recommended to check home blood pressure and heart rate  - Scr and K WNL on recent BMP - Recommend to continue amlodipine, losartan/hydrochlorothiazide and nebivolol. Await cardio follow up as well.   *Also discussed prediabetes and focus on diet high in fiber/protein  Follow Up Plan: PRN  Arbutus Leas, PharmD, BCPS, CPP Clinical Pharmacist Practitioner [place] [facility] at [facility] [phone]

## 2023-12-27 ENCOUNTER — Ambulatory Visit (INDEPENDENT_AMBULATORY_CARE_PROVIDER_SITE_OTHER)

## 2023-12-27 VITALS — BP 125/79 | HR 59 | Ht 72.0 in | Wt 186.0 lb

## 2023-12-27 DIAGNOSIS — Z0000 Encounter for general adult medical examination without abnormal findings: Secondary | ICD-10-CM | POA: Diagnosis not present

## 2023-12-27 NOTE — Progress Notes (Signed)
 Subjective:   Kevin Bradley is a 78 y.o. who presents for a Medicare Wellness preventive visit.  Visit Complete: Virtual I connected with  Kevin Bradley on 12/27/23 by a audio enabled telemedicine application and verified that I am speaking with the correct person using two identifiers.  Patient Location: Home  Provider Location: Home Office  I discussed the limitations of evaluation and management by telemedicine. The patient expressed understanding and agreed to proceed.  Vital Signs: Because this visit was a virtual/telehealth visit, some criteria may be missing or patient reported. Any vitals not documented were not able to be obtained and vitals that have been documented are patient reported.  VideoDeclined- This patient declined Librarian, academic. Therefore the visit was completed with audio only.  Persons Participating in Visit: Patient.  AWV Questionnaire: No: Patient Medicare AWV questionnaire was not completed prior to this visit.  Cardiac Risk Factors include: advanced age (>30men, >2 women);male gender;hypertension;Other (see comment);dyslipidemia, Risk factor comments: A-Fib, COPD     Objective:    Today's Vitals   12/27/23 0937  BP: 125/79  Pulse: (!) 59  Weight: 186 lb (84.4 kg)  Height: 6' (1.829 m)   Body mass index is 25.23 kg/m.     12/27/2023    9:45 AM 02/02/2023    8:35 AM 04/27/2022    9:07 AM 04/11/2021    9:58 AM 03/11/2018    9:12 PM 01/05/2018    1:30 PM 10/24/2016   11:59 AM  Advanced Directives  Does Patient Have a Medical Advance Directive? No Yes No Yes No No Yes  Type of Furniture conservator/restorer;Living will  Healthcare Power of [employer] [place];Living will  Does patient want to make changes to medical advance directive?   No - Patient declined No - Patient declined   No - Patient declined  Copy of Healthcare Power of Attorney in Chart?  No - copy requested  No -  copy requested   Yes  Would patient like information on creating a medical advance directive?   No - Patient declined  No - Patient declined No - Patient declined     Current Medications (verified) Outpatient Encounter Medications as of 12/27/2023  Medication Sig   amLODipine  (NORVASC ) 10 MG tablet TAKE 1 TABLET(10 MG) BY MOUTH DAILY   aspirin  EC 81 MG tablet Take 1 tablet (81 mg total) by mouth 2 (two) times daily.   diazepam  (VALIUM ) 5 MG tablet Take 1 tablet (5 mg total) by mouth every 12 (twelve) hours as needed for anxiety.   flecainide  (TAMBOCOR ) 50 MG tablet Take 1 tablet (50 mg total) by mouth 2 (two) times daily.   latanoprost (XALATAN) 0.005 % ophthalmic solution 1 drop at bedtime.   levothyroxine  (SYNTHROID ) 75 MCG tablet Take 1 tablet (75 mcg total) by mouth daily before breakfast.   losartan -hydrochlorothiazide (HYZAAR) 100-12.5 MG tablet Take 1 tablet by mouth daily.   nebivolol  (BYSTOLIC ) 2.5 MG tablet Take 2.5 mg by mouth daily.   omega-3 acid ethyl esters (LOVAZA ) 1 g capsule TAKE 2 CAPSULES BY MOUTH TWICE DAILY   potassium chloride  SA (KLOR-CON  M) 20 MEQ tablet TAKE 1 TABLET(20 MEQ) BY MOUTH TWICE DAILY   rosuvastatin  (CRESTOR ) 10 MG tablet Take 1 tablet (10 mg total) by mouth daily.   thiamine  (VITAMIN B-1) 50 MG tablet Take 1 tablet (50 mg total) by mouth daily.   VITAMIN D , CHOLECALCIFEROL , PO Take by mouth daily.  No facility-administered encounter medications on file as of 12/27/2023.    Allergies (verified) Penicillins   History: Past Medical History:  Diagnosis Date   Anxiety    Atrial fibrillation (HCC)    BPH (benign prostatic hypertrophy)    External thrombosed hemorrhoids    History of colonoscopy    HTN (hypertension)    Hx of colonic polyps    Hypothyroidism    Low HDL (under 40)    Nephrolithiasis    Past Surgical History:  Procedure Laterality Date   APPENDECTOMY     cyst tongue  1990   benign   INGUINAL HERNIA REPAIR Right 1990    INGUINAL HERNIA REPAIR Left 1948   THYROIDECTOMY, PARTIAL  1990   TONSILLECTOMY  1948   Family History  Problem Relation Age of Onset   Heart attack Father    AAA (abdominal aortic aneurysm) Father 6   Alcohol abuse Other    Breast cancer Other    Cancer Neg Hx    Early death Neg Hx    Diabetes Neg Hx    Heart disease Neg Hx    Hyperlipidemia Neg Hx    Hypertension Neg Hx    Kidney disease Neg Hx    Stroke Neg Hx    Colon cancer Neg Hx    Social History   Socioeconomic History   Marital status: Married    Spouse name: [name]   Number of children: 1   Years of education: Not on file   Highest education level: Some college, no degree  Occupational History   Occupation: SEMI Administrator  Tobacco Use   Smoking status: Former    Current packs/day: 0.00    Average packs/day: 2.0 packs/day for 10.0 years (20.0 ttl pk-yrs)    Types: Cigars, Cigarettes    Start date: 53    Quit date: 2005    Years since quitting: 20.3   Smokeless tobacco: Never   Tobacco comments:    quit smoking cigars in 2017--04/15/18  Vaping Use   Vaping status: Never Used  Substance and Sexual Activity   Alcohol use: No    Alcohol/week: 0.0 standard drinks of alcohol   Drug use: No   Sexual activity: Yes  Other Topics Concern   Not on file  Social History Narrative   Lives at home with wife and one dog.   Social Drivers of Corporate investment banker Strain: Low Risk  (10/23/2023)   Overall Financial Resource Strain (CARDIA)    Difficulty of Paying Living Expenses: Not hard at all  Food Insecurity: No Food Insecurity (10/23/2023)   Hunger Vital Sign    Worried About Running Out of Food in the Last Year: Never true    Ran Out of Food in the Last Year: Never true  Transportation Needs: No Transportation Needs (12/27/2023)   PRAPARE - Administrator, Civil Service (Medical): No    Lack of Transportation (Non-Medical): No  Physical Activity: Sufficiently Active (12/27/2023)    Exercise Vital Sign    Days of Exercise per Week: 4 days    Minutes of Exercise per Session: 60 min  Recent Concern: Physical Activity - Insufficiently Active (10/23/2023)   Exercise Vital Sign    Days of Exercise per Week: 3 days    Minutes of Exercise per Session: 30 min  Stress: No Stress Concern Present (12/27/2023)   [employer] of Occupational Health - Occupational Stress Questionnaire    Feeling of Stress : Not at all  Social Connections: Moderately Isolated (12/27/2023)   Social Connection and Isolation Panel [NHANES]    Frequency of Communication with Friends and Family: Twice a week    Frequency of Social Gatherings with Friends and Family: Three times a week    Attends Religious Services: Never    Active Member of Clubs or Organizations: No    Attends Banker Meetings: Never    Marital Status: Married    Tobacco Counseling Counseling given: Not Answered Tobacco comments: quit smoking cigars in 2017--04/15/18    Clinical Intake:  Pre-visit preparation completed: Yes  Pain : No/denies pain     BMI - recorded: 25.23 Nutritional Status: BMI 25 -29 Overweight Nutritional Risks: None Diabetes: No  Lab Results  Component Value Date   HGBA1C 6.1 10/25/2023   HGBA1C 6.1 04/19/2023   HGBA1C 6.2 09/23/2022     How often do you need to have someone help you when you read instructions, pamphlets, or other written materials from your doctor or pharmacy?: 1 - Never  Interpreter Needed?: No  Information entered by :: Derrian Rodak, RMA   Activities of Daily Living     12/27/2023    9:39 AM 01/28/2023    5:50 PM  In your present state of health, do you have any difficulty performing the following activities:  Hearing? 1 0  Comment wears hearing aides   Vision? 0 0  Difficulty concentrating or making decisions? 0 0  Walking or climbing stairs? 0 0  Dressing or bathing? 0 0  Doing errands, shopping? 0 0  Preparing Food and eating ? N N  Using the  Toilet? N N  In the past six months, have you accidently leaked urine? N N  Do you have problems with loss of bowel control? N N  Managing your Medications? N N  Managing your Finances? N N  Housekeeping or managing your Housekeeping? N N    Patient Care Team: Arcadio Knuckles, MD as PCP - General Audery Blazing, Deannie Fabian, MD as PCP - Cardiology (Cardiology) Corie Diamond, MD as Consulting Physician (Ophthalmology)  Indicate any recent Medical Services you may have received from other than [facility] providers in the past year (date may be approximate).     Assessment:   This is a routine wellness examination for Sammie Crigler.  Hearing/Vision screen Hearing Screening - Comments:: Wears hearing aides Vision Screening - Comments:: Had Cataract    Goals Addressed             This Visit's Progress    My goal is to stay alive.   On track      Depression Screen     12/27/2023    9:48 AM 09/14/2023    1:18 PM 01/29/2023    9:26 AM 04/27/2022    9:06 AM 04/11/2021   10:14 AM 09/27/2020   12:29 PM 03/07/2019    9:49 AM  PHQ 2/9 Scores  PHQ - 2 Score 0 0 0 0 0 0 0  PHQ- 9 Score 0  0        Fall Risk     12/27/2023    9:45 AM 09/14/2023    1:18 PM 01/29/2023    9:26 AM 01/28/2023    5:50 PM 04/27/2022    9:08 AM  Fall Risk   Falls in the past year? 0 0 0 0 0  Number falls in past yr: 0 0 0  0  Injury with Fall? 0 0 0 0 0  Risk for fall  due to : No Fall Risks No Fall Risks No Fall Risks  No Fall Risks  Follow up Falls evaluation completed;Falls prevention discussed Falls evaluation completed Falls evaluation completed Falls prevention discussed;Falls evaluation completed Falls evaluation completed    MEDICARE RISK AT HOME:  Medicare Risk at Home Any stairs in or around the home?: Yes (2 story home) If so, are there any without handrails?: Yes Home free of loose throw rugs in walkways, pet beds, electrical cords, etc?: Yes Adequate lighting in your home to reduce risk of falls?: Yes Life  alert?: No Use of a cane, walker or w/c?: No Grab bars in the bathroom?: Yes Shower chair or bench in shower?: No Elevated toilet seat or a handicapped toilet?: Yes  TIMED UP AND GO:  Was the test performed?  No  Cognitive Function: Declined: Patient declined cognitive screening, but was able to answer questions in an accurate and timely manner. No cognitive impairments observed.        02/02/2023    8:20 AM 04/27/2022    9:09 AM  6CIT Screen  What Year? 0 points 0 points  What month? 0 points 0 points  What time? 0 points 0 points  Count back from 20 0 points 0 points  Months in reverse 0 points 0 points  Repeat phrase 0 points 0 points  Total Score 0 points 0 points    Immunizations Immunization History  Administered Date(s) Administered   Fluad Quad(high Dose 65+) 05/08/2021   Fluad Trivalent(High Dose 65+) 06/26/2023   Influenza Whole 06/07/2009, 07/14/2010   Influenza, High Dose Seasonal PF 07/03/2017, 06/07/2018, 06/06/2019, 05/22/2022   Influenza,inj,Quad PF,6+ Mos 06/21/2020   Influenza-Unspecified 06/21/2013, 07/10/2015, 07/18/2016   PFIZER(Purple Top)SARS-COV-2 Vaccination 10/29/2019, 11/22/2019   Pneumococcal Conjugate-13 09/17/2014   Pneumococcal Polysaccharide-23 07/14/2010, 08/01/2018   Td 08/11/2010   Zoster, Live 10/16/2013    Screening Tests Health Maintenance  Topic Date Due   Zoster Vaccines- Shingrix  (1 of 2) 05/26/1995   DTaP/Tdap/Td (2 - Tdap) 08/11/2020   COVID-19 Vaccine (3 - 2024-25 season) 05/09/2023   Medicare Annual Wellness (AWV)  02/02/2024   INFLUENZA VACCINE  04/07/2024   Pneumonia Vaccine 18+ Years old  Completed   Hepatitis C Screening  Completed   HPV VACCINES  Aged Out   Meningococcal B Vaccine  Aged Out   Colonoscopy  Discontinued    Health Maintenance  Health Maintenance Due  Topic Date Due   Zoster Vaccines- Shingrix  (1 of 2) 05/26/1995   DTaP/Tdap/Td (2 - Tdap) 08/11/2020   COVID-19 Vaccine (3 - 2024-25 season)  05/09/2023   Medicare Annual Wellness (AWV)  02/02/2024   Health Maintenance Items Addressed: See Nurse Notes  Additional Screening:  Vision Screening: Recommended annual ophthalmology exams for early detection of glaucoma and other disorders of the eye.  Dental Screening: Recommended annual dental exams for proper oral hygiene  Community Resource Referral / Chronic Care Management: CRR required this visit?  No   CCM required this visit?  No     Plan:     I have personally reviewed and noted the following in the patient's chart:   Medical and social history Use of alcohol, tobacco or illicit drugs  Current medications and supplements including opioid prescriptions. Patient is not currently taking opioid prescriptions. Functional ability and status Nutritional status Physical activity Advanced directives List of other physicians Hospitalizations, surgeries, and ER visits in previous 12 months Vitals Screenings to include cognitive, depression, and falls Referrals and appointments  In addition, I  have reviewed and discussed with patient certain preventive protocols, quality metrics, and best practice recommendations. A written personalized care plan for preventive services as well as general preventive health recommendations were provided to patient.     Zionna Homewood L Carling Liberman, CMA   12/27/2023   After Visit Summary: (MyChart) Due to this being a telephonic visit, the after visit summary with patients personalized plan was offered to patient via MyChart   Notes: Please refer to Routing Comments.

## 2023-12-27 NOTE — Patient Instructions (Signed)
 Kevin Bradley , Thank you for taking time to come for your Medicare Wellness Visit. I appreciate your ongoing commitment to your health goals. Please review the following plan we discussed and let me know if I can assist you in the future.   Referrals/Orders/Follow-Ups/Clinician Recommendations: It was nice talking with you today.  You are due for a Tetanus and Shingles vaccine.  Keep up the good work.    This is a list of the screening recommended for you and due dates:  Health Maintenance  Topic Date Due   Zoster (Shingles) Vaccine (1 of 2) 05/26/1995   DTaP/Tdap/Td vaccine (2 - Tdap) 08/11/2020   COVID-19 Vaccine (3 - 2024-25 season) 05/09/2023   Medicare Annual Wellness Visit  02/02/2024   Flu Shot  04/07/2024   Pneumonia Vaccine  Completed   Hepatitis C Screening  Completed   HPV Vaccine  Aged Out   Meningitis B Vaccine  Aged Out   Colon Cancer Screening  Discontinued    Advanced directives: (Copy Requested) Please bring a copy of your health care power of attorney and living will to the office to be added to your chart at your convenience. You can mail to [facility] [address] [place], [place] [address] or email to [email or web address]  Next Medicare Annual Wellness Visit scheduled for next year: Yes

## 2024-01-13 ENCOUNTER — Other Ambulatory Visit: Payer: Self-pay | Admitting: Internal Medicine

## 2024-01-18 ENCOUNTER — Other Ambulatory Visit: Payer: Self-pay

## 2024-01-23 ENCOUNTER — Other Ambulatory Visit: Payer: Self-pay | Admitting: Internal Medicine

## 2024-01-23 DIAGNOSIS — E039 Hypothyroidism, unspecified: Secondary | ICD-10-CM

## 2024-01-27 NOTE — Progress Notes (Signed)
 HPI: FU atrial fibrillation. A previous cardioNet monitor revealed sinus rhythm with occasional bursts of atrial fibrillation. He had an episode of chest pain approximately 9 years ago and had a cardiac catheterization that was normal by his report perfomed in [place] . Patient placed on flecanide previously. A followup stress echocardiogram showed no ischemia and normal LV function. There was a question of septal hypokinesis both at rest and with stress. There was no exercise induced arrhythmias. Cardionet in Aug 2012 showed sinus with PAT but no VT. Previously declined anticoagulants. Abdominal ultrasound in May of 2014 showed no aneurysm.  Echocardiogram September 2020 showed normal LV function, mild left ventricular hypertrophy, mild left atrial enlargement.  Since I last saw him he denies dyspnea, chest pain, palpitations or syncope.  Current Outpatient Medications  Medication Sig Dispense Refill   amLODipine  (NORVASC ) 10 MG tablet TAKE 1 TABLET(10 MG) BY MOUTH DAILY 90 tablet 3   aspirin  EC 81 MG tablet Take 1 tablet (81 mg total) by mouth 2 (two) times daily. 90 tablet 1   diazepam  (VALIUM ) 5 MG tablet Take 1 tablet (5 mg total) by mouth every 12 (twelve) hours as needed for anxiety. 10 tablet 1   flecainide  (TAMBOCOR ) 50 MG tablet Take 1 tablet (50 mg total) by mouth 2 (two) times daily. 180 tablet 1   latanoprost (XALATAN) 0.005 % ophthalmic solution 1 drop at bedtime.     levothyroxine  (SYNTHROID ) 75 MCG tablet Take 1 tablet (75 mcg total) by mouth daily before breakfast. 90 tablet 1   losartan -hydrochlorothiazide (HYZAAR) 100-12.5 MG tablet TAKE 1 TABLET BY MOUTH DAILY 90 tablet 0   nebivolol  (BYSTOLIC ) 2.5 MG tablet Take 1 tablet (2.5 mg total) by mouth daily. Please keep scheduled appointment for future refills. Thank you. 30 tablet 0   omega-3 acid ethyl esters (LOVAZA ) 1 g capsule TAKE 2 CAPSULES BY MOUTH TWICE DAILY 360 capsule 1   potassium chloride  SA (KLOR-CON  M) 20 MEQ tablet  TAKE 1 TABLET(20 MEQ) BY MOUTH TWICE DAILY 180 tablet 0   rosuvastatin  (CRESTOR ) 10 MG tablet Take 1 tablet (10 mg total) by mouth daily. 90 tablet 1   thiamine  (VITAMIN B-1) 50 MG tablet Take 1 tablet (50 mg total) by mouth daily. 90 tablet 1   VITAMIN D , CHOLECALCIFEROL , PO Take by mouth daily.     No current facility-administered medications for this visit.     Past Medical History:  Diagnosis Date   Anxiety    Atrial fibrillation (HCC)    BPH (benign prostatic hypertrophy)    External thrombosed hemorrhoids    History of colonoscopy    HTN (hypertension)    Hx of colonic polyps    Hypothyroidism    Low HDL (under 40)    Nephrolithiasis     Past Surgical History:  Procedure Laterality Date   APPENDECTOMY     cyst tongue  1990   benign   INGUINAL HERNIA REPAIR Right 1990   INGUINAL HERNIA REPAIR Left 1948   THYROIDECTOMY, PARTIAL  1990   TONSILLECTOMY  1948    Social History   Socioeconomic History   Marital status: Married    Spouse name: [name]   Number of children: 1   Years of education: Not on file   Highest education level: Some college, no degree  Occupational History   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   Social Drivers of Corporate investment banker Strain: Low Risk  (12/27/2023)   Overall Financial Resource Strain (CARDIA)    Difficulty of Paying Living  Expenses: Not hard at all  Food Insecurity: No Food Insecurity (12/27/2023)   Hunger Vital Sign    Worried About Running Out of Food in the Last Year: Never true    Ran Out of Food in the Last Year: Never true  Transportation Needs: No Transportation Needs (12/27/2023)   PRAPARE - Administrator, Civil Service (Medical): No    Lack of Transportation (Non-Medical): No  Physical Activity: Sufficiently Active (12/27/2023)   Exercise Vital Sign    Days of Exercise per Week: 4 days    Minutes of Exercise per Session: 60 min  Recent Concern: Physical Activity - Insufficiently Active (10/23/2023)   Exercise Vital Sign    Days of Exercise per Week: 3 days    Minutes of Exercise per Session: 30 min  Stress: No Stress Concern Present (12/27/2023)   [employer] of Occupational Health - Occupational Stress Questionnaire    Feeling of Stress : Not at all  Social Connections: Moderately Isolated (12/27/2023)   Social Connection and Isolation Panel [NHANES]    Frequency of Communication with Friends and Family: Twice a week    Frequency of Social Gatherings with Friends and Family: Three times a week    Attends Religious Services: Never    Active Member of Clubs or Organizations: No    Attends Banker Meetings: Never    Marital Status: Married  Catering manager Violence: Not At Risk (12/27/2023)   Humiliation, Afraid, Rape, and Kick questionnaire    Fear of Current or Ex-Partner: No    Emotionally Abused: No    Physically Abused: No    Sexually Abused: No    Family History  Problem Relation Age of Onset   Heart attack Father    AAA (abdominal aortic aneurysm) Father 68   Alcohol abuse Other    Breast cancer Other    Cancer Neg Hx    Early death Neg Hx    Diabetes Neg Hx    Heart disease Neg Hx    Hyperlipidemia Neg Hx    Hypertension Neg Hx    Kidney disease Neg Hx    Stroke Neg Hx    Colon cancer Neg Hx     ROS: no fevers or chills, productive cough,  hemoptysis, dysphasia, odynophagia, melena, hematochezia, dysuria, hematuria, rash, seizure activity, orthopnea, PND, pedal edema, claudication. Remaining systems are negative.  Physical Exam: Well-developed well-nourished in no acute distress.  Skin is warm and dry.  HEENT is normal.  Neck is supple.  Chest is clear to auscultation with normal expansion.  Cardiovascular exam is regular rate and rhythm.  Abdominal exam nontender or distended. No masses palpated. Extremities show no edema. neuro grossly  intact  EKG Interpretation Date/Time:  Thursday February 10 2024 09:12:36 EDT Ventricular Rate:  46 PR Interval:  288 QRS Duration:  106 QT Interval:  452 QTC Calculation: 395 R Axis:   86  Text Interpretation: Sinus bradycardia with 1st degree A-V block Confirmed by Alexandria Angel ([id number]) on 02/10/2024 9:18:28 AM    A/P  1 paroxysmal atrial fibrillation-patient remains in sinus rhythm.  Will continue Bystolic  and flecainide  at present dose.  Patient continues to decline anticoagulation understanding the higher risk of CVA.  Note he is mildly bradycardic with a first-degree AV block.  Would like to continue beta-blocker in the setting of use of flecainide .  May need to discontinue in the future.  2 hypertension-blood pressure controlled.  Continue present medical regimen.  3 history of palpitations-no recent symptoms.  Continue beta-blocker at present dose.  As above we will watch heart rate closely given mild bradycardia and first-degree AV block.  4 hyperlipidemia-managed by primary care.  Alexandria Angel, MD

## 2024-02-03 ENCOUNTER — Other Ambulatory Visit: Payer: Self-pay | Admitting: Cardiology

## 2024-02-10 ENCOUNTER — Encounter: Payer: Self-pay | Admitting: Cardiology

## 2024-02-10 ENCOUNTER — Other Ambulatory Visit: Payer: Self-pay | Admitting: Cardiology

## 2024-02-10 ENCOUNTER — Ambulatory Visit: Payer: PPO | Attending: Cardiology | Admitting: Cardiology

## 2024-02-10 VITALS — BP 114/68 | HR 46 | Ht 72.0 in | Wt 186.2 lb

## 2024-02-10 DIAGNOSIS — I10 Essential (primary) hypertension: Secondary | ICD-10-CM | POA: Diagnosis not present

## 2024-02-10 DIAGNOSIS — I480 Paroxysmal atrial fibrillation: Secondary | ICD-10-CM | POA: Diagnosis not present

## 2024-02-10 DIAGNOSIS — R002 Palpitations: Secondary | ICD-10-CM

## 2024-02-10 NOTE — Patient Instructions (Signed)
 Medication Instructions:  Continue same medications *If you need a refill on your cardiac medications before your next appointment, please call your pharmacy*  Lab Work: None ordered  Testing/Procedures: None ordered  Follow-Up: At [facility], you and your health needs are our priority.  As part of our continuing mission to provide you with exceptional heart care, our providers are all part of one team.  This team includes your primary Cardiologist (physician) and Advanced Practice Providers or APPs (Physician Assistants and Nurse Practitioners) who all work together to provide you with the care you need, when you need it.  Your next appointment:  6 months  Call in July to schedule Dec appointment     Provider:  Lilian Register   We recommend signing up for the patient portal called "MyChart".  Sign up information is provided on this After Visit Summary.  MyChart is used to connect with patients for Virtual Visits (Telemedicine).  Patients are able to view lab/test results, encounter notes, upcoming appointments, etc.  Non-urgent messages can be sent to your provider as well.   To learn more about what you can do with MyChart, go to [email or web address].

## 2024-02-15 ENCOUNTER — Other Ambulatory Visit: Payer: Self-pay | Admitting: Internal Medicine

## 2024-02-15 DIAGNOSIS — I10 Essential (primary) hypertension: Secondary | ICD-10-CM

## 2024-03-20 ENCOUNTER — Encounter: Payer: Self-pay | Admitting: Internal Medicine

## 2024-03-24 ENCOUNTER — Other Ambulatory Visit: Payer: Self-pay

## 2024-03-24 DIAGNOSIS — F40243 Fear of flying: Secondary | ICD-10-CM

## 2024-03-31 ENCOUNTER — Other Ambulatory Visit: Payer: Self-pay

## 2024-03-31 DIAGNOSIS — F40243 Fear of flying: Secondary | ICD-10-CM

## 2024-03-31 MED ORDER — DIAZEPAM 5 MG PO TABS: 5.0000 mg | ORAL_TABLET | Freq: Two times a day (BID) | ORAL | 1 refills | Status: AC | PRN

## 2024-04-09 ENCOUNTER — Other Ambulatory Visit: Payer: Self-pay | Admitting: Internal Medicine

## 2024-04-09 DIAGNOSIS — E781 Pure hyperglyceridemia: Secondary | ICD-10-CM

## 2024-04-11 NOTE — Telephone Encounter (Signed)
 Last OV 10/25/23 Next OV 12/27/24  Last refill 07/13/23 Qty #360/1

## 2024-04-15 ENCOUNTER — Other Ambulatory Visit: Payer: Self-pay | Admitting: Internal Medicine

## 2024-04-17 ENCOUNTER — Other Ambulatory Visit: Payer: Self-pay | Admitting: Internal Medicine

## 2024-04-17 DIAGNOSIS — L237 Allergic contact dermatitis due to plants, except food: Secondary | ICD-10-CM | POA: Diagnosis not present

## 2024-04-17 DIAGNOSIS — L2989 Other pruritus: Secondary | ICD-10-CM | POA: Diagnosis not present

## 2024-04-24 ENCOUNTER — Other Ambulatory Visit: Payer: Self-pay | Admitting: Internal Medicine

## 2024-04-24 DIAGNOSIS — E039 Hypothyroidism, unspecified: Secondary | ICD-10-CM

## 2024-04-24 NOTE — Telephone Encounter (Unsigned)
 Copied from CRM #[id number]. Topic: Clinical - Medication Refill >> Apr 24, 2024 10:07 AM Chiquita SQUIBB wrote: Medication:  levothyroxine  levothyroxine  (SYNTHROID ) 75 MCG tablet   Has the patient contacted their pharmacy? Yes (Agent: If no, request that the patient contact the pharmacy for the refill. If patient does not wish to contact the pharmacy document the reason why and proceed with request.) (Agent: If yes, when and what did the pharmacy advise?)  This is the patient's preferred pharmacy:  [id number] - [place], [place] - [address] AT [facility] OF [address] & [address] [place] [address] Phone: [phone] Fax: [phone]  Is this the correct pharmacy for this prescription? Yes If no, delete pharmacy and type the correct one.   Has the prescription been filled recently? No  Is the patient out of the medication? No  Has the patient been seen for an appointment in the last year OR does the patient have an upcoming appointment? Yes  Can we respond through MyChart? Yes  Agent: Please be advised that Rx refills may take up to 3 business days. We ask that you follow-up with your pharmacy.

## 2024-04-26 MED ORDER — LEVOTHYROXINE SODIUM 75 MCG PO TABS: 75.0000 ug | ORAL_TABLET | Freq: Every day | ORAL | 1 refills | Status: DC

## 2024-05-08 ENCOUNTER — Other Ambulatory Visit: Payer: Self-pay | Admitting: Cardiology

## 2024-05-11 ENCOUNTER — Other Ambulatory Visit: Payer: Self-pay | Admitting: Cardiology

## 2024-05-11 ENCOUNTER — Other Ambulatory Visit (HOSPITAL_COMMUNITY): Payer: Self-pay

## 2024-05-11 MED ORDER — LATANOPROST 0.005 % OP SOLN
1.0000 [drp] | Freq: Every day | OPHTHALMIC | 3 refills | Status: AC
  Filled 2024-07-31: qty 5, 50d supply, fill #0

## 2024-05-11 MED ORDER — FLUOCINONIDE 0.05 % EX OINT
TOPICAL_OINTMENT | Freq: Two times a day (BID) | CUTANEOUS | 1 refills | Status: AC
  Filled 2024-06-12: qty 60, 30d supply, fill #0

## 2024-05-11 MED ORDER — LEVOTHYROXINE SODIUM 75 MCG PO TABS
75.0000 ug | ORAL_TABLET | Freq: Every day | ORAL | 1 refills | Status: AC
  Filled 2024-07-31: qty 90, 90d supply, fill #0

## 2024-05-11 MED ORDER — LEVOTHYROXINE SODIUM 75 MCG PO TABS: 75.0000 ug | ORAL_TABLET | Freq: Every day | ORAL | 1 refills | Status: DC

## 2024-05-11 MED ORDER — NEBIVOLOL HCL 2.5 MG PO TABS
2.5000 mg | ORAL_TABLET | Freq: Every day | ORAL | 3 refills | Status: AC
  Filled 2024-05-11 (×3): qty 90, 90d supply, fill #0

## 2024-05-12 ENCOUNTER — Other Ambulatory Visit (HOSPITAL_COMMUNITY): Payer: Self-pay

## 2024-05-22 ENCOUNTER — Other Ambulatory Visit (HOSPITAL_COMMUNITY): Payer: Self-pay

## 2024-05-22 ENCOUNTER — Ambulatory Visit (INDEPENDENT_AMBULATORY_CARE_PROVIDER_SITE_OTHER): Admitting: Internal Medicine

## 2024-05-22 ENCOUNTER — Other Ambulatory Visit: Payer: Self-pay | Admitting: Internal Medicine

## 2024-05-22 ENCOUNTER — Encounter: Payer: Self-pay | Admitting: Internal Medicine

## 2024-05-22 ENCOUNTER — Ambulatory Visit: Payer: Self-pay | Admitting: Internal Medicine

## 2024-05-22 VITALS — BP 138/86 | HR 65 | Temp 98.5°F | Resp 16 | Ht 72.0 in | Wt 179.5 lb

## 2024-05-22 DIAGNOSIS — Z23 Encounter for immunization: Secondary | ICD-10-CM | POA: Diagnosis not present

## 2024-05-22 DIAGNOSIS — R7303 Prediabetes: Secondary | ICD-10-CM

## 2024-05-22 DIAGNOSIS — E519 Thiamine deficiency, unspecified: Secondary | ICD-10-CM

## 2024-05-22 DIAGNOSIS — E039 Hypothyroidism, unspecified: Secondary | ICD-10-CM

## 2024-05-22 DIAGNOSIS — I10 Essential (primary) hypertension: Secondary | ICD-10-CM | POA: Diagnosis not present

## 2024-05-22 DIAGNOSIS — E538 Deficiency of other specified B group vitamins: Secondary | ICD-10-CM | POA: Insufficient documentation

## 2024-05-22 DIAGNOSIS — D510 Vitamin B12 deficiency anemia due to intrinsic factor deficiency: Secondary | ICD-10-CM | POA: Diagnosis not present

## 2024-05-22 LAB — CBC WITH DIFFERENTIAL/PLATELET
Basophils Absolute: 0 K/uL (ref 0.0–0.1)
Basophils Relative: 0.5 % (ref 0.0–3.0)
Eosinophils Absolute: 0.1 K/uL (ref 0.0–0.7)
Eosinophils Relative: 0.7 % (ref 0.0–5.0)
HCT: 50.6 % (ref 39.0–52.0)
Hemoglobin: 17.1 g/dL — ABNORMAL HIGH (ref 13.0–17.0)
Lymphocytes Relative: 18.1 % (ref 12.0–46.0)
Lymphs Abs: 1.5 K/uL (ref 0.7–4.0)
MCHC: 33.7 g/dL (ref 30.0–36.0)
MCV: 90.6 fl (ref 78.0–100.0)
Monocytes Absolute: 0.6 K/uL (ref 0.1–1.0)
Monocytes Relative: 7.5 % (ref 3.0–12.0)
Neutro Abs: 6.2 K/uL (ref 1.4–7.7)
Neutrophils Relative %: 73.2 % (ref 43.0–77.0)
Platelets: 221 K/uL (ref 150.0–400.0)
RBC: 5.59 Mil/uL (ref 4.22–5.81)
RDW: 13.2 % (ref 11.5–15.5)
WBC: 8.5 K/uL (ref 4.0–10.5)

## 2024-05-22 LAB — BASIC METABOLIC PANEL WITH GFR
BUN: 20 mg/dL (ref 6–23)
CO2: 29 meq/L (ref 19–32)
Calcium: 9.3 mg/dL (ref 8.4–10.5)
Chloride: 101 meq/L (ref 96–112)
Creatinine, Ser: 0.88 mg/dL (ref 0.40–1.50)
GFR: 82.08 mL/min (ref >60.00)
Glucose, Bld: 111 mg/dL — ABNORMAL HIGH (ref 70–99)
Potassium: 3.8 meq/L (ref 3.5–5.1)
Sodium: 138 meq/L (ref 135–145)

## 2024-05-22 LAB — HEMOGLOBIN A1C: Hgb A1c MFr Bld: 6.4 % (ref 4.6–6.5)

## 2024-05-22 LAB — VITAMIN B12: Vitamin B-12: 213 pg/mL (ref 211–911)

## 2024-05-22 LAB — TSH: TSH: 2.45 u[IU]/mL (ref 0.35–5.50)

## 2024-05-22 MED ORDER — BOOSTRIX 5-2.5-18.5 LF-MCG/0.5 IM SUSY
0.5000 mL | PREFILLED_SYRINGE | Freq: Once | INTRAMUSCULAR | 0 refills | Status: AC
  Filled 2024-05-22 – 2024-08-14 (×2): qty 0.5, 1d supply, fill #0

## 2024-05-22 MED ORDER — LOSARTAN POTASSIUM-HCTZ 100-12.5 MG PO TABS
1.0000 | ORAL_TABLET | Freq: Every day | ORAL | 1 refills | Status: AC
Start: 2024-05-22 — End: ?
  Filled 2024-05-22: qty 90, 90d supply, fill #0
  Filled 2024-08-21: qty 90, 90d supply, fill #1

## 2024-05-22 MED ORDER — CYANOCOBALAMIN 1000 MCG PO TABS
2000.0000 ug | ORAL_TABLET | Freq: Every day | ORAL | 1 refills | Status: AC
  Filled 2024-05-22: qty 180, 90d supply, fill #0

## 2024-05-22 MED ORDER — SHINGRIX 50 MCG/0.5ML IM SUSR
0.5000 mL | Freq: Once | INTRAMUSCULAR | 1 refills | Status: AC
  Filled 2024-05-22: qty 0.5, 1d supply, fill #0

## 2024-05-22 MED FILL — Losartan Potassium & Hydrochlorothiazide Tab 100-12.5 MG: ORAL | 30 days supply | Qty: 30 | Fill #0 | Status: CN

## 2024-05-22 NOTE — Progress Notes (Signed)
 Subjective:  Patient ID: Kevin Bradley, male    DOB: [birth year]  Age: 78 y.o. MRN: [id number]  CC: Hypothyroidism, Hypertension, Hyperlipidemia, and Annual Exam   HPI Kevin Bradley presents for a CPX and f/up ----  Discussed the use of AI scribe software for clinical note transcription with the patient, who gave verbal consent to proceed.  History of Present Illness Kevin Bradley is a 78 year old male who presents with progressive leg weakness and weight loss.  He has experienced progressive leg weakness since his last visit, affecting his ability to walk normally. His legs feel weaker, 'a little wobbly', and 'not as steady as he should be'. He has had episodes where his legs have given out, occurring three or four times since the last visit, with the most recent episode about two weeks ago. No numbness, tingling, or pain in the lower legs. He is currently doing physical therapy at home, including riding a bike and working with forty pounds three to four times a week, which seems to help slightly.  He is concerned about unintentional weight loss, noting a decrease to 179 pounds from 186 pounds in June. He has not been actively trying to lose weight. He denies heart racing, jitteriness, nervousness, diarrhea, constipation, abdominal pain, nausea, or vomiting.  He experiences intermittent episodes of feeling unwell without specific symptoms, describing it as a 'shitty feeling' that comes and goes throughout the day. No dizziness, lightheadedness, chest pain, or fatigue during these episodes.  His legs swell if he walks or drives all day, and he wears compression socks to manage this. He did not take his medication on the morning of the visit.     Outpatient Medications Prior to Visit  Medication Sig Dispense Refill   amLODipine  (NORVASC ) 10 MG tablet Take 1 tablet (10 mg total) by mouth daily. 90 tablet 3   aspirin  EC 81 MG tablet Take 1 tablet (81 mg total) by mouth 2 (two) times  daily. 90 tablet 1   diazepam  (VALIUM ) 5 MG tablet Take 1 tablet (5 mg total) by mouth every 12 (twelve) hours as needed for anxiety. 10 tablet 1   flecainide  (TAMBOCOR ) 50 MG tablet TAKE 1 TABLET(50 MG) BY MOUTH TWICE DAILY 180 tablet 2   fluocinonide  ointment (LIDEX ) 0.05 % Apply topically to rash 2 (two) times daily for 2 weeks on then 2 weeks off as needed for flares. 60 g 1   latanoprost  (XALATAN ) 0.005 % ophthalmic solution 1 drop at bedtime.     latanoprost  (XALATAN ) 0.005 % ophthalmic solution Place 1 drop into both eyes at bedtime. 5 mL 3   levothyroxine  (SYNTHROID ) 75 MCG tablet Take 1 tablet (75 mcg total) by mouth daily before breakfast. 90 tablet 1   nebivolol  (BYSTOLIC ) 2.5 MG tablet Take 1 tablet (2.5 mg total) by mouth daily. 90 tablet 3   omega-3 acid ethyl esters (LOVAZA ) 1 g capsule Take 2 capsules (2 g total) by mouth 2 (two) times daily. 360 capsule 1   potassium chloride  SA (KLOR-CON  M) 20 MEQ tablet TAKE 1 TABLET(20 MEQ) BY MOUTH TWICE DAILY 180 tablet 0   thiamine  (VITAMIN B-1) 50 MG tablet Take 1 tablet (50 mg total) by mouth daily. 90 tablet 1   VITAMIN D , CHOLECALCIFEROL , PO Take by mouth daily.     losartan -hydrochlorothiazide  (HYZAAR ) 100-12.5 MG tablet Take 1 tablet by mouth daily. NEEDS APPOINTMENT FOR REFILLS 30 tablet 0   levothyroxine  (SYNTHROID ) 75 MCG tablet Take 1 tablet (75  mcg total) by mouth daily before breakfast. 90 tablet 1   levothyroxine  (SYNTHROID ) 75 MCG tablet Take 1 tablet (75 mcg total) by mouth daily before breakfast. 90 tablet 1   nebivolol  (BYSTOLIC ) 2.5 MG tablet TAKE 1 TABLET(2.5 MG) BY MOUTH DAILY 90 tablet 3   rosuvastatin  (CRESTOR ) 10 MG tablet Take 1 tablet (10 mg total) by mouth daily. 90 tablet 1   No facility-administered medications prior to visit.    ROS Review of Systems  Constitutional:  Negative for appetite change, chills, diaphoresis, fatigue, fever and unexpected weight change.  HENT: Negative.  Negative for sore throat and  trouble swallowing.   Respiratory: Negative.  Negative for cough, chest tightness, shortness of breath and wheezing.   Cardiovascular:  Negative for chest pain, palpitations and leg swelling.  Gastrointestinal:  Negative for abdominal pain, diarrhea, nausea and vomiting.  Endocrine: Negative for cold intolerance and heat intolerance.  Genitourinary:  Negative for difficulty urinating.  Musculoskeletal:  Negative for arthralgias and myalgias.  Skin: Negative.   Neurological:  Positive for weakness. Negative for dizziness, light-headedness and numbness.  Hematological:  Negative for adenopathy. Does not bruise/bleed easily.  Psychiatric/Behavioral: Negative.      Objective:  BP 138/86 (BP Location: Left Arm, Patient Position: Sitting, Cuff Size: Normal)   Pulse 65   Temp 98.5 F (36.9 C) (Temporal)   Resp 16   Ht 6' (1.829 m)   Wt 179 lb 8 oz (81.4 kg)   SpO2 98%   BMI 24.34 kg/m   BP Readings from Last 3 Encounters:  05/22/24 138/86  02/10/24 114/68  12/27/23 125/79    Wt Readings from Last 3 Encounters:  05/22/24 179 lb 8 oz (81.4 kg)  02/10/24 186 lb 3.2 oz (84.5 kg)  12/27/23 186 lb (84.4 kg)    Physical Exam Vitals reviewed.  HENT:     Mouth/Throat:     Mouth: Mucous membranes are moist.  Eyes:     General: No scleral icterus.    Conjunctiva/sclera: Conjunctivae normal.  Cardiovascular:     Rate and Rhythm: Normal rate and regular rhythm.     Heart sounds: No murmur heard.    No friction rub. No gallop.  Pulmonary:     Effort: Pulmonary effort is normal.     Breath sounds: No stridor. No wheezing, rhonchi or rales.  Abdominal:     General: Abdomen is flat.     Palpations: There is no mass.     Tenderness: There is no abdominal tenderness. There is no guarding.     Hernia: No hernia is present.  Musculoskeletal:        General: Normal range of motion.     Cervical back: Neck supple.     Right lower leg: No edema.     Left lower leg: No edema.   Lymphadenopathy:     Cervical: No cervical adenopathy.  Skin:    General: Skin is warm and dry.  Neurological:     General: No focal deficit present.     Mental Status: He is oriented to person, place, and time. Mental status is at baseline.  Psychiatric:        Mood and Affect: Mood normal.        Behavior: Behavior normal.     Lab Results  Component Value Date   WBC 8.5 05/22/2024   HGB 17.1 (H) 05/22/2024   HCT 50.6 05/22/2024   PLT 221.0 05/22/2024   GLUCOSE 111 (H) 05/22/2024   CHOL 166  10/25/2023   TRIG 177.0 (H) 10/25/2023   HDL 32.40 (L) 10/25/2023   LDLDIRECT 79.0 03/25/2022   LDLCALC 98 10/25/2023   ALT 13 04/19/2023   AST 16 04/19/2023   NA 138 05/22/2024   K 3.8 05/22/2024   CL 101 05/22/2024   CREATININE 0.88 05/22/2024   BUN 20 05/22/2024   CO2 29 05/22/2024   TSH 2.45 05/22/2024   PSA 4.03 (H) 10/25/2023   HGBA1C 6.4 05/22/2024    MR LUMBAR SPINE WO CONTRAST Result Date: 03/01/2023 CLINICAL DATA:  Low back pain with bilateral leg weakness for 2 weeks EXAM: MRI LUMBAR SPINE WITHOUT CONTRAST TECHNIQUE: Multiplanar, multisequence MR imaging of the lumbar spine was performed. No intravenous contrast was administered. COMPARISON:  None Available. FINDINGS: Segmentation:  Standard. Alignment: Grade 1 anterolisthesis of L5 on S1 secondary to bilateral L5 pars interarticularis defects. Vertebrae: No acute fracture, evidence of discitis, or aggressive bone lesion. Conus medullaris and cauda equina: Conus extends to the L1-2 level. Conus and cauda equina appear normal. Paraspinal and other soft tissues: No acute paraspinal abnormality. Disc levels: Disc spaces: Degenerative disease with disc height loss at L1-2, L2-3, L3-4 and most severe at L5-S1. T12-L1: No significant disc bulge. Mild bilateral facet arthropathy. No foraminal or central canal stenosis. L1-L2: Broad-based disc bulge. Moderate bilateral facet arthropathy. Bilateral subarticular recess stenosis. Moderate  bilateral foraminal stenosis. No spinal stenosis. L2-L3: Broad-based disc bulge. Moderate bilateral facet arthropathy. Mild spinal stenosis. Bilateral subarticular recess stenosis. Moderate bilateral foraminal stenosis, left greater than right. L3-L4: Broad-based disc bulge. Moderate bilateral facet arthropathy. Mild spinal stenosis. Moderate bilateral foraminal stenosis. L4-L5: No disc protrusion. Moderate bilateral facet arthropathy. Moderate right foraminal stenosis. No left foraminal stenosis. No spinal stenosis. L5-S1: No disc protrusion. Mild bilateral facet arthropathy. Severe bilateral foraminal stenosis. No spinal stenosis. IMPRESSION: 1. Lumbar spine spondylosis as described above. 2. No acute osseous injury of the lumbar spine. Electronically Signed   By: Julaine Blanch M.D.   On: 03/01/2023 11:30    Assessment & Plan:   Acquired hypothyroidism- He is euthyroid. -     TSH; Future -     TSH; Future  Vitamin B12 deficiency anemia due to intrinsic factor deficiency -     CBC with Differential/Platelet; Future -     Vitamin B12; Future -     CBC with Differential/Platelet; Future -     Cyanocobalamin ; Take 2 tablets (2,000 mcg total) by mouth daily.  Dispense: 180 tablet; Refill: 1  Thiamine  deficiency -     CBC with Differential/Platelet; Future -     CBC with Differential/Platelet; Future  Prediabetes -     Basic metabolic panel with GFR; Future -     Hemoglobin A1c; Future -     Basic metabolic panel with GFR; Future -     Hemoglobin A1c; Future  Essential hypertension- BP is well controlled. -     Basic metabolic panel with GFR; Future -     Losartan  Potassium-HCTZ; Take 1 tablet by mouth daily.  Dispense: 90 tablet; Refill: 1  Need for prophylactic vaccination with combined diphtheria-tetanus-pertussis (DTP) vaccine -     Boostrix ; Inject 0.5 mLs into the muscle once for 1 dose.  Dispense: 0.5 mL; Refill: 0  Need for prophylactic vaccination and inoculation against  varicella -     Shingrix ; Inject 0.5 mLs into the muscle once for 1 dose.  Dispense: 0.5 mL; Refill: 1  B12 deficiency -     Vitamin B12; Future  Need for immunization against influenza -     Flu vaccine HIGH DOSE PF(Fluzone Trivalent)     Follow-up: Return in about 6 months (around 11/19/2024).  Debby Molt, MD

## 2024-05-22 NOTE — Patient Instructions (Signed)
 Health Maintenance, Male  Adopting a healthy lifestyle and getting preventive care are important in promoting health and wellness. Ask your health care provider about:  The right schedule for you to have regular tests and exams.  Things you can do on your own to prevent diseases and keep yourself healthy.  What should I know about diet, weight, and exercise?  Eat a healthy diet    Eat a diet that includes plenty of vegetables, fruits, low-fat dairy products, and lean protein.  Do not eat a lot of foods that are high in solid fats, added sugars, or sodium.  Maintain a healthy weight  Body mass index (BMI) is a measurement that can be used to identify possible weight problems. It estimates body fat based on height and weight. Your health care provider can help determine your BMI and help you achieve or maintain a healthy weight.  Get regular exercise  Get regular exercise. This is one of the most important things you can do for your health. Most adults should:  Exercise for at least 150 minutes each week. The exercise should increase your heart rate and make you sweat (moderate-intensity exercise).  Do strengthening exercises at least twice a week. This is in addition to the moderate-intensity exercise.  Spend less time sitting. Even light physical activity can be beneficial.  Watch cholesterol and blood lipids  Have your blood tested for lipids and cholesterol at 79 years of age, then have this test every 5 years.  You may need to have your cholesterol levels checked more often if:  Your lipid or cholesterol levels are high.  You are older than 79 years of age.  You are at high risk for heart disease.  What should I know about cancer screening?  Many types of cancers can be detected early and may often be prevented. Depending on your health history and family history, you may need to have cancer screening at various ages. This may include screening for:  Colorectal cancer.  Prostate cancer.  Skin cancer.  Lung  cancer.  What should I know about heart disease, diabetes, and high blood pressure?  Blood pressure and heart disease  High blood pressure causes heart disease and increases the risk of stroke. This is more likely to develop in people who have high blood pressure readings or are overweight.  Talk with your health care provider about your target blood pressure readings.  Have your blood pressure checked:  Every 3-5 years if you are 24-52 years of age.  Every year if you are 3 years old or older.  If you are between the ages of 60 and 72 and are a current or former smoker, ask your health care provider if you should have a one-time screening for abdominal aortic aneurysm (AAA).  Diabetes  Have regular diabetes screenings. This checks your fasting blood sugar level. Have the screening done:  Once every three years after age 66 if you are at a normal weight and have a low risk for diabetes.  More often and at a younger age if you are overweight or have a high risk for diabetes.  What should I know about preventing infection?  Hepatitis B  If you have a higher risk for hepatitis B, you should be screened for this virus. Talk with your health care provider to find out if you are at risk for hepatitis B infection.  Hepatitis C  Blood testing is recommended for:  Everyone born from [birth year] through 1966.  Anyone  with known risk factors for hepatitis C.  Sexually transmitted infections (STIs)  You should be screened each year for STIs, including gonorrhea and chlamydia, if:  You are sexually active and are younger than 79 years of age.  You are older than 79 years of age and your health care provider tells you that you are at risk for this type of infection.  Your sexual activity has changed since you were last screened, and you are at increased risk for chlamydia or gonorrhea. Ask your health care provider if you are at risk.  Ask your health care provider about whether you are at high risk for HIV. Your health care provider  may recommend a prescription medicine to help prevent HIV infection. If you choose to take medicine to prevent HIV, you should first get tested for HIV. You should then be tested every 3 months for as long as you are taking the medicine.  Follow these instructions at home:  Alcohol use  Do not drink alcohol if your health care provider tells you not to drink.  If you drink alcohol:  Limit how much you have to 0-2 drinks a day.  Know how much alcohol is in your drink. In the [place] one drink equals one 12 oz bottle of beer (355 mL), one 5 oz glass of wine (148 mL), or one 1 oz glass of hard liquor (44 mL).  Lifestyle  Do not use any products that contain nicotine or tobacco. These products include cigarettes, chewing tobacco, and vaping devices, such as e-cigarettes. If you need help quitting, ask your health care provider.  Do not use street drugs.  Do not share needles.  Ask your health care provider for help if you need support or information about quitting drugs.  General instructions  Schedule regular health, dental, and eye exams.  Stay current with your vaccines.  Tell your health care provider if:  You often feel depressed.  You have ever been abused or do not feel safe at home.  Summary  Adopting a healthy lifestyle and getting preventive care are important in promoting health and wellness.  Follow your health care provider's instructions about healthy diet, exercising, and getting tested or screened for diseases.  Follow your health care provider's instructions on monitoring your cholesterol and blood pressure.  This information is not intended to replace advice given to you by your health care provider. Make sure you discuss any questions you have with your health care provider.  Document Revised: 01/13/2021 Document Reviewed: 01/13/2021  Elsevier Patient Education  2024 [employer].

## 2024-05-30 DIAGNOSIS — L821 Other seborrheic keratosis: Secondary | ICD-10-CM | POA: Diagnosis not present

## 2024-05-30 DIAGNOSIS — Z85828 Personal history of other malignant neoplasm of skin: Secondary | ICD-10-CM | POA: Diagnosis not present

## 2024-05-30 DIAGNOSIS — Z08 Encounter for follow-up examination after completed treatment for malignant neoplasm: Secondary | ICD-10-CM | POA: Diagnosis not present

## 2024-05-30 DIAGNOSIS — L308 Other specified dermatitis: Secondary | ICD-10-CM | POA: Diagnosis not present

## 2024-05-30 DIAGNOSIS — L570 Actinic keratosis: Secondary | ICD-10-CM | POA: Diagnosis not present

## 2024-05-30 DIAGNOSIS — L814 Other melanin hyperpigmentation: Secondary | ICD-10-CM | POA: Diagnosis not present

## 2024-06-05 DIAGNOSIS — H40053 Ocular hypertension, bilateral: Secondary | ICD-10-CM | POA: Diagnosis not present

## 2024-06-12 ENCOUNTER — Other Ambulatory Visit (HOSPITAL_COMMUNITY): Payer: Self-pay

## 2024-06-12 ENCOUNTER — Encounter: Payer: Self-pay | Admitting: Cardiology

## 2024-06-12 ENCOUNTER — Other Ambulatory Visit: Payer: Self-pay

## 2024-06-12 MED ORDER — FLECAINIDE ACETATE 50 MG PO TABS
50.0000 mg | ORAL_TABLET | Freq: Two times a day (BID) | ORAL | 3 refills | Status: AC
  Filled 2024-06-12: qty 180, 90d supply, fill #0
  Filled 2024-09-19: qty 180, 90d supply, fill #1

## 2024-07-05 ENCOUNTER — Other Ambulatory Visit (HOSPITAL_COMMUNITY): Payer: Self-pay

## 2024-07-05 MED FILL — Amlodipine Besylate Tab 10 MG (Base Equivalent): ORAL | 90 days supply | Qty: 90 | Fill #0 | Status: AC

## 2024-07-31 ENCOUNTER — Other Ambulatory Visit (HOSPITAL_COMMUNITY): Payer: Self-pay

## 2024-07-31 NOTE — Progress Notes (Signed)
 HPI: FU atrial fibrillation. A previous cardioNet monitor revealed sinus rhythm with occasional bursts of atrial fibrillation. He had an episode of chest pain approximately 9 years ago and had a cardiac catheterization that was normal by his report perfomed in [place] . Patient placed on flecanide previously. A followup stress echocardiogram showed no ischemia and normal LV function. There was a question of septal hypokinesis both at rest and with stress. There was no exercise induced arrhythmias. Cardionet in Aug 2012 showed sinus with PAT but no VT. Previously declined anticoagulants. Abdominal ultrasound in May of 2014 showed no aneurysm.  Echocardiogram September 2020 showed normal LV function, mild left ventricular hypertrophy, mild left atrial enlargement.  Since I last saw him the patient has dyspnea with more extreme activities but not with routine activities. It is relieved with rest. It is not associated with chest pain. There is no orthopnea, PND or pedal edema. There is no syncope or palpitations. There is no exertional chest pain.   Current Outpatient Medications  Medication Sig Dispense Refill   amLODipine  (NORVASC ) 10 MG tablet Take 1 tablet (10 mg total) by mouth daily. 90 tablet 3   aspirin  EC 81 MG tablet Take 1 tablet (81 mg total) by mouth 2 (two) times daily. 90 tablet 1   cyanocobalamin  1000 MCG tablet Take 2 tablets (2,000 mcg total) by mouth daily. 180 tablet 1   diazepam  (VALIUM ) 5 MG tablet Take 1 tablet (5 mg total) by mouth every 12 (twelve) hours as needed for anxiety. 10 tablet 1   flecainide  (TAMBOCOR ) 50 MG tablet Take 1 tablet (50 mg total) by mouth 2 (two) times daily. 180 tablet 3   fluocinonide  ointment (LIDEX ) 0.05 % Apply topically to rash 2 (two) times daily for 2 weeks on then 2 weeks off as needed for flares. 60 g 1   latanoprost  (XALATAN ) 0.005 % ophthalmic solution Place 1 drop into both eyes at bedtime. 5 mL 3   levothyroxine  (SYNTHROID ) 75 MCG tablet  Take 1 tablet (75 mcg total) by mouth daily before breakfast. 90 tablet 1   losartan -hydrochlorothiazide  (HYZAAR ) 100-12.5 MG tablet Take 1 tablet by mouth daily. 90 tablet 1   nebivolol  (BYSTOLIC ) 2.5 MG tablet Take 1 tablet (2.5 mg total) by mouth daily. 90 tablet 3   omega-3 acid ethyl esters (LOVAZA ) 1 g capsule Take 2 capsules (2 g total) by mouth 2 (two) times daily. 360 capsule 1   potassium chloride  SA (KLOR-CON  M) 20 MEQ tablet TAKE 1 TABLET(20 MEQ) BY MOUTH TWICE DAILY 180 tablet 0   thiamine  (VITAMIN B-1) 50 MG tablet Take 1 tablet (50 mg total) by mouth daily. 90 tablet 1   VITAMIN D , CHOLECALCIFEROL , PO Take by mouth daily.     No current facility-administered medications for this visit.     Past Medical History:  Diagnosis Date   Anxiety    Atrial fibrillation (HCC)    BPH (benign prostatic hypertrophy)    External thrombosed hemorrhoids    History of colonoscopy    HTN (hypertension)    Hx of colonic polyps    Hypothyroidism    Low HDL (under 40)    Nephrolithiasis     Past Surgical History:  Procedure Laterality Date   APPENDECTOMY     cyst tongue  1990   benign   INGUINAL HERNIA REPAIR Right 1990   INGUINAL HERNIA REPAIR Left 1948   THYROIDECTOMY, PARTIAL  1990   TONSILLECTOMY  1948    Social History  Socioeconomic History   Marital status: Married    Spouse name: [name]   Number of children: 1   Years of education: Not on file   Highest education level: Bachelor's degree (e.g., BA, AB, BS)  Occupational History   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   Social Drivers of Corporate Investment Banker Strain: Low Risk  (05/21/2024)   Overall Financial Resource Strain (CARDIA)    Difficulty of Paying Living Expenses: Not hard at all  Food Insecurity: No Food Insecurity (05/21/2024)   Hunger Vital Sign    Worried About Running Out of Food in the Last Year: Never true    Ran Out of Food in the Last Year: Never true  Transportation Needs: No Transportation Needs (05/21/2024)   PRAPARE - Administrator, Civil Service (Medical): No    Lack of Transportation (Non-Medical): No  Physical Activity: Insufficiently Active (05/21/2024)   Exercise Vital Sign    Days of Exercise per Week: 4 days    Minutes of Exercise per Session: 30 min  Stress: No Stress Concern Present (05/21/2024)   [employer] of Occupational Health - Occupational Stress Questionnaire    Feeling of Stress: Not at all  Social Connections: Socially Isolated (05/21/2024)   Social Connection and Isolation Panel    Frequency of Communication with Friends and Family: Once a week    Frequency of Social Gatherings with Friends and Family: Once a week    Attends Religious Services: Never    Database Administrator or Organizations: No    Attends Engineer, Structural: Not on file    Marital Status: Married  Catering Manager Violence: Not At Risk (12/27/2023)   Humiliation, Afraid, Rape, and Kick questionnaire    Fear of Current or Ex-Partner: No    Emotionally Abused: No    Physically Abused: No    Sexually Abused: No    Family History  Problem Relation Age of Onset   Heart attack Father    AAA (abdominal aortic aneurysm) Father 24   Alcohol abuse Other    Breast cancer Other    Cancer Neg Hx    Early death Neg Hx    Diabetes Neg Hx    Heart disease  Neg Hx    Hyperlipidemia Neg Hx    Hypertension Neg Hx    Kidney disease Neg Hx    Stroke Neg Hx    Colon cancer Neg Hx     ROS: no fevers or chills, productive cough, hemoptysis, dysphasia, odynophagia, melena, hematochezia, dysuria, hematuria, rash, seizure activity, orthopnea, PND, pedal edema, claudication. Remaining systems are negative.  Physical Exam: Well-developed well-nourished in no acute distress.  Skin is warm and dry.  HEENT is normal.  Neck is supple.  Chest is clear to auscultation with normal expansion.  Cardiovascular exam is regular rate and rhythm.  Abdominal exam nontender or distended. No masses palpated. Extremities show no edema. neuro grossly intact  EKG Interpretation Date/Time:  Monday August 14 2024 08:22:51 EST Ventricular Rate:  62 PR Interval:    QRS Duration:  104 QT Interval:  426 QTC Calculation: 432 R Axis:   -11  Text Interpretation: Normal sinus rhythm with 1st degree A-V block Confirmed by Pietro Rogue ([id number]) on 08/14/2024 8:37:27 AM    A/P  1 paroxysmal atrial fibrillation-patient is in sinus rhythm today.  Will continue flecainide  and Bystolic  at present dose.  He has declined anticoagulation and understands the higher risk of embolic event including CVA.  2 history of palpitations-denies recent symptoms.  Continue beta-blocker.  3 hypertension-patient's blood pressure is controlled.  Continue present medications.  4 hyperlipidemia-per primary care.  Rogue Pietro, MD

## 2024-08-14 ENCOUNTER — Ambulatory Visit: Attending: Cardiology | Admitting: Cardiology

## 2024-08-14 ENCOUNTER — Other Ambulatory Visit (HOSPITAL_COMMUNITY): Payer: Self-pay

## 2024-08-14 ENCOUNTER — Encounter: Payer: Self-pay | Admitting: Cardiology

## 2024-08-14 VITALS — BP 130/60 | HR 62 | Ht 72.0 in | Wt 190.7 lb

## 2024-08-14 DIAGNOSIS — I480 Paroxysmal atrial fibrillation: Secondary | ICD-10-CM | POA: Diagnosis not present

## 2024-08-14 DIAGNOSIS — I10 Essential (primary) hypertension: Secondary | ICD-10-CM | POA: Diagnosis not present

## 2024-08-14 DIAGNOSIS — R002 Palpitations: Secondary | ICD-10-CM | POA: Diagnosis not present

## 2024-08-14 NOTE — Patient Instructions (Signed)

## 2024-08-21 ENCOUNTER — Other Ambulatory Visit (HOSPITAL_COMMUNITY): Payer: Self-pay

## 2024-08-30 ENCOUNTER — Other Ambulatory Visit (HOSPITAL_COMMUNITY): Payer: Self-pay

## 2024-09-19 ENCOUNTER — Encounter: Payer: Self-pay | Admitting: Internal Medicine

## 2024-09-19 ENCOUNTER — Other Ambulatory Visit (HOSPITAL_COMMUNITY): Payer: Self-pay

## 2024-09-19 MED FILL — Omega-3-acid Ethyl Esters Cap 1 GM: ORAL | 90 days supply | Qty: 360 | Fill #0 | Status: AC

## 2024-09-21 ENCOUNTER — Other Ambulatory Visit (HOSPITAL_COMMUNITY): Payer: Self-pay

## 2024-09-21 ENCOUNTER — Other Ambulatory Visit: Payer: Self-pay

## 2024-09-21 DIAGNOSIS — I10 Essential (primary) hypertension: Secondary | ICD-10-CM

## 2024-09-21 MED ORDER — POTASSIUM CHLORIDE CRYS ER 20 MEQ PO TBCR
20.0000 meq | EXTENDED_RELEASE_TABLET | Freq: Two times a day (BID) | ORAL | 0 refills | Status: AC
  Filled 2024-09-21: qty 180, 90d supply, fill #0

## 2024-10-03 ENCOUNTER — Telehealth: Payer: Self-pay

## 2024-10-03 ENCOUNTER — Other Ambulatory Visit (HOSPITAL_COMMUNITY): Payer: Self-pay

## 2024-10-03 MED FILL — Amlodipine Besylate Tab 10 MG (Base Equivalent): ORAL | 90 days supply | Qty: 90 | Fill #1 | Status: AC

## 2024-10-03 NOTE — Telephone Encounter (Signed)
 Copied from CRM [id number]. Topic: Clinical - Medical Advice >> Oct 03, 2024  8:53 AM Herma G wrote: Reason for CRM: Pt requested a call back at [phone] to discuss if pt would what would be the best course of action due to experiencing intense back pain.

## 2024-10-03 NOTE — Telephone Encounter (Signed)
 Patient has been scheduled for an ACUTE OV

## 2024-10-03 NOTE — Telephone Encounter (Signed)
 Unable to reach patient. LMTRC

## 2024-10-04 ENCOUNTER — Ambulatory Visit (INDEPENDENT_AMBULATORY_CARE_PROVIDER_SITE_OTHER): Admitting: Emergency Medicine

## 2024-10-04 ENCOUNTER — Other Ambulatory Visit (HOSPITAL_COMMUNITY): Payer: Self-pay

## 2024-10-04 ENCOUNTER — Encounter: Payer: Self-pay | Admitting: Emergency Medicine

## 2024-10-04 VITALS — BP 126/82 | HR 75 | Temp 97.7°F | Ht 72.0 in | Wt 194.0 lb

## 2024-10-04 DIAGNOSIS — S39012A Strain of muscle, fascia and tendon of lower back, initial encounter: Secondary | ICD-10-CM

## 2024-10-04 DIAGNOSIS — M5136 Other intervertebral disc degeneration, lumbar region with discogenic back pain only: Secondary | ICD-10-CM | POA: Diagnosis not present

## 2024-10-04 DIAGNOSIS — M545 Low back pain, unspecified: Secondary | ICD-10-CM | POA: Insufficient documentation

## 2024-10-04 DIAGNOSIS — M47816 Spondylosis without myelopathy or radiculopathy, lumbar region: Secondary | ICD-10-CM

## 2024-10-04 MED ORDER — CYCLOBENZAPRINE HCL 10 MG PO TABS
10.0000 mg | ORAL_TABLET | Freq: Every day | ORAL | 0 refills | Status: AC
  Filled 2024-10-04: qty 30, 30d supply, fill #0

## 2024-10-04 MED ORDER — MELOXICAM 15 MG PO TABS
15.0000 mg | ORAL_TABLET | Freq: Every day | ORAL | 0 refills | Status: AC
  Filled 2024-10-04: qty 10, 10d supply, fill #0

## 2024-10-04 MED ORDER — TRAMADOL HCL 50 MG PO TABS
50.0000 mg | ORAL_TABLET | Freq: Three times a day (TID) | ORAL | 1 refills | Status: AC | PRN
  Filled 2024-10-04: qty 15, 5d supply, fill #0

## 2024-10-04 NOTE — Assessment & Plan Note (Signed)
 Related to strenuous activity at work Pain management discussed Recommend meloxicam , tramadol , and Flexeril 

## 2024-10-04 NOTE — Progress Notes (Signed)
 Kevin Bradley 79 y.o.   Chief Complaint  Patient presents with   Back Pain    Pt state that he has been dealing with this for a long time now.    HISTORY OF PRESENT ILLNESS: Acute problem visit today. This is a 79 y.o. male complaining of lumbar pain which was aggravated last Friday when he picked up two 50 pound boxes while at work Advance steady pain with some radiation into the groin area.  No radiation to the legs.  Denies bowel or bladder symptoms Denies numbness or tingling to legs. Lumbar spine MRI from 02/22/2023 showed diffuse lumbar spine disc disease with spondylosis No other complaints or medical concerns today. Needs referral and pain management  Back Pain Pertinent negatives include no abdominal pain, chest pain, dysuria, fever or headaches.     Prior to Admission medications  Medication Sig Start Date End Date Taking? Authorizing Provider  amLODipine  (NORVASC ) 10 MG tablet Take 1 tablet (10 mg total) by mouth daily. 02/10/24  Yes Pietro Redell RAMAN, MD  aspirin  EC 81 MG tablet Take 1 tablet (81 mg total) by mouth 2 (two) times daily. 04/19/23  Yes Joshua Debby LITTIE, MD  cyanocobalamin  1000 MCG tablet Take 2 tablets (2,000 mcg total) by mouth daily. 05/22/24  Yes Joshua Debby LITTIE, MD  diazepam  (VALIUM ) 5 MG tablet Take 1 tablet (5 mg total) by mouth every 12 (twelve) hours as needed for anxiety. 03/31/24  Yes Joshua Debby LITTIE, MD  flecainide  (TAMBOCOR ) 50 MG tablet Take 1 tablet (50 mg total) by mouth 2 (two) times daily. 06/12/24  Yes Pietro Redell RAMAN, MD  fluocinonide  ointment (LIDEX ) 0.05 % Apply topically to rash 2 (two) times daily for 2 weeks on then 2 weeks off as needed for flares. 04/17/24  Yes   latanoprost  (XALATAN ) 0.005 % ophthalmic solution Place 1 drop into both eyes at bedtime. 11/29/23  Yes   levothyroxine  (SYNTHROID ) 75 MCG tablet Take 1 tablet (75 mcg total) by mouth daily before breakfast. 10/25/23  Yes   losartan -hydrochlorothiazide  (HYZAAR ) 100-12.5 MG tablet  Take 1 tablet by mouth daily. 05/22/24  Yes Joshua Debby LITTIE, MD  nebivolol  (BYSTOLIC ) 2.5 MG tablet Take 1 tablet (2.5 mg total) by mouth daily. 05/10/24  Yes   omega-3 acid ethyl esters (LOVAZA ) 1 g capsule Take 2 capsules (2 g total) by mouth 2 (two) times daily. 04/11/24  Yes Joshua Debby LITTIE, MD  potassium chloride  SA (KLOR-CON  M) 20 MEQ tablet Take 1 tablet (20 mEq total) by mouth 2 (two) times daily. 09/21/24  Yes Joshua Debby LITTIE, MD  thiamine  (VITAMIN B-1) 50 MG tablet Take 1 tablet (50 mg total) by mouth daily. 09/28/22  Yes Joshua Debby LITTIE, MD  VITAMIN D , CHOLECALCIFEROL , PO Take by mouth daily.   Yes [provider]    Allergies[1]  Patient Active Problem List   Diagnosis Date Noted   Need for prophylactic vaccination with combined diphtheria-tetanus-pertussis (DTP) vaccine 05/22/2024   Need for prophylactic vaccination and inoculation against varicella 05/22/2024   B12 deficiency 05/22/2024   Myalgia, multiple sites 05/12/2023   Bilateral stenosis of lateral recess of lumbar spine 04/19/2023   Thiamine  deficiency 09/28/2022   Polycythemia, secondary 10/10/2020   Encounter for general adult medical examination with abnormal findings 09/28/2020   Tinnitus aurium, bilateral 02/19/2020   Degenerative disc disease, cervical 01/10/2020   Vitamin D  deficiency 12/14/2017   Hyperlipidemia with target LDL less than 100 09/17/2015   Hypertriglyceridemia 02/10/2014   COPD (chronic  obstructive pulmonary disease) with chronic bronchitis (HCC) 11/06/2011   Adrenal nodule 06/25/2011   Prediabetes 07/11/2010   Paroxysmal atrial fibrillation (HCC) 12/03/2009   Hypothyroidism 05/17/2009   Fear of flying 05/17/2009   Essential hypertension 05/17/2009   BPH without obstruction/lower urinary tract symptoms 05/17/2009    Past Medical History:  Diagnosis Date   Anxiety    Atrial fibrillation (HCC)    BPH (benign prostatic hypertrophy)    External thrombosed hemorrhoids    History of  colonoscopy    HTN (hypertension)    Hx of colonic polyps    Hypothyroidism    Low HDL (under 40)    Nephrolithiasis     Past Surgical History:  Procedure Laterality Date   APPENDECTOMY     cyst tongue  1990   benign   INGUINAL HERNIA REPAIR Right 1990   INGUINAL HERNIA REPAIR Left 1948   THYROIDECTOMY, PARTIAL  1990   TONSILLECTOMY  1948    Social History   Socioeconomic History   Marital status: Married    Spouse name: [name]   Number of children: 1   Years of education: Not on file   Highest education level: Bachelor's degree (e.g., BA, AB, BS)  Occupational History   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   Social Drivers of Health   Tobacco Use: Medium Risk (10/04/2024)   Patient History    Smoking Tobacco Use: Former    Smokeless Tobacco Use: Never    Passive Exposure: Not on file  Financial Resource Strain: Low Risk (05/21/2024)   Overall Financial Resource Strain (CARDIA)    Difficulty of Paying Living Expenses: Not hard at all  Food Insecurity: No Food Insecurity (05/21/2024)   Epic    Worried About Programme Researcher, Broadcasting/film/video in the Last Year: Never true    Ran Out of Food in the Last Year: Never true  Transportation Needs: No Transportation Needs (05/21/2024)   Epic    Lack of Transportation (Medical): No    Lack of Transportation (Non-Medical): No  Physical Activity:  Insufficiently Active (05/21/2024)   Exercise Vital Sign    Days of Exercise per Week: 4 days    Minutes of Exercise per Session: 30 min  Stress: No Stress Concern Present (05/21/2024)   [employer] of Occupational Health - Occupational Stress Questionnaire    Feeling of Stress: Not at all  Social Connections: Socially Isolated (05/21/2024)   Social Connection and Isolation Panel    Frequency of Communication with Friends and Family: Once a week    Frequency of Social Gatherings with Friends and Family: Once a week    Attends Religious Services: Never    Database Administrator or Organizations: No    Attends Banker Meetings:  Not on file    Marital Status: Married  Intimate Partner Violence: Not At Risk (12/27/2023)   Humiliation, Afraid, Rape, and Kick questionnaire    Fear of Current or Ex-Partner: No    Emotionally Abused: No    Physically Abused: No    Sexually Abused: No  Depression (PHQ2-9): Low Risk (12/27/2023)   Depression (PHQ2-9)    PHQ-2 Score: 0  Alcohol Screen: Low Risk (12/27/2023)   Alcohol Screen    Last Alcohol Screening Score (AUDIT): 0  Housing: Low Risk (05/21/2024)   Epic    Unable to Pay for Housing in the Last Year: No    Number of Times Moved in the Last Year: 0    Homeless in the Last Year: No  Utilities: Not At Risk (12/27/2023)   AHC Utilities    Threatened with loss of utilities: No  Health Literacy: Adequate Health Literacy (12/27/2023)   B1300 Health Literacy    Frequency of need for help with medical instructions: Never    Family History  Problem Relation Age of Onset   Heart attack Father    AAA (abdominal aortic aneurysm) Father 34   Alcohol abuse Other    Breast cancer Other    Cancer Neg Hx    Early death Neg Hx    Diabetes Neg Hx    Heart disease Neg Hx    Hyperlipidemia Neg Hx    Hypertension Neg Hx    Kidney disease Neg Hx    Stroke Neg Hx    Colon cancer Neg Hx      Review of Systems  Constitutional:  Negative.  Negative for chills and fever.  HENT:  Negative for congestion and sore throat.   Respiratory: Negative.  Negative for cough and shortness of breath.   Cardiovascular: Negative.  Negative for chest pain and palpitations.  Gastrointestinal:  Negative for abdominal pain, diarrhea, nausea and vomiting.  Genitourinary: Negative.  Negative for dysuria and hematuria.  Musculoskeletal:  Positive for back pain.  Skin: Negative.  Negative for rash.  Neurological: Negative.  Negative for dizziness, sensory change, focal weakness and headaches.  All other systems reviewed and are negative.   Vitals:   10/04/24 1502  BP: 126/82  Pulse: 75  Temp: 97.7 F (36.5 C)  SpO2: 97%    Physical Exam Vitals reviewed.  Constitutional:      Appearance: Normal appearance.  HENT:     Head: Normocephalic.  Eyes:     Extraocular Movements: Extraocular movements intact.  Cardiovascular:     Rate and Rhythm: Normal rate.  Pulmonary:     Effort: Pulmonary effort is normal.  Abdominal:     Palpations: Abdomen is soft.     Tenderness: There is no abdominal tenderness.  Musculoskeletal:     Lumbar back: Spasms and tenderness present. No bony tenderness. Decreased range of motion.     Right lower leg: No edema.     Left lower leg: No edema.  Skin:    General: Skin is warm and dry.  Neurological:     General: No focal deficit present.     Mental Status: He is alert and oriented to person, place, and time.     Sensory: No sensory deficit.     Motor: No weakness.  Psychiatric:        Mood and Affect: Mood normal.        Behavior: Behavior normal.      ASSESSMENT & PLAN: Problem List Items Addressed This Visit  Musculoskeletal and Integument   Lumbar strain, initial encounter   Related to strenuous activity at work Pain management discussed Recommend meloxicam , tramadol , and Flexeril       Lumbar spondylosis   Pain management discussed Lumbar spine MRI report from 2024  reviewed with patient Needs orthopedic evaluation Referral placed today      Relevant Orders   Ambulatory referral to Orthopedic Surgery     Other   Lumbar pain - Primary   Chronic with acute exacerbation Mechanical in nature. Clinically stable.  No red flag signs or symptoms Secondary to lumbar degenerative disc disease Pain management discussed Recommend meloxicam  50 mg daily for 10 days Tramadol  for severe pain Flexeril  10 mg at bedtime      Relevant Medications   meloxicam  (MOBIC ) 15 MG tablet   cyclobenzaprine  (FLEXERIL ) 10 MG tablet   traMADol  (ULTRAM ) 50 MG tablet   Patient Instructions  Chronic Back Pain Chronic back pain is back pain that lasts longer than 3 months. The pain may get worse at certain times (flare-ups). There are things you can do at home to manage your pain. Follow these instructions at home: Watch for any changes in your symptoms. Take these actions to help with your pain: Managing pain and stiffness     If told, put ice on the painful area. You may be told to use ice for 24-48 hours after a flare-up starts. Put ice in a plastic bag. Place a towel between your skin and the bag. Leave the ice on for 20 minutes, 2-3 times a day. If told, put heat on the painful area. Do this as often as told by your doctor. Use the heat source that your doctor recommends, such as a moist heat pack or a heating pad. Place a towel between your skin and the heat source. Leave the heat on for 20-30 minutes. If your skin turns bright red, take off the ice or heat right away to prevent skin damage. The risk of damage is higher if you cannot feel pain, heat, or cold. Soak in a warm bath. This can help with pain. Activity        Avoid bending and other activities that make the pain worse. When you stand: Keep your upper back and neck straight. Keep your shoulders pulled back. Avoid slouching. When you sit: Keep your back straight. Relax your shoulders. Do not  round your shoulders or pull them backward. Do not sit or stand in one place for too long. Take short rest breaks during the day. Lying down or standing is often better than sitting. Resting can help relieve pain. When sitting or lying down for a long time, do some mild activity or stretching. This will help to prevent stiffness and pain. Get regular exercise. Ask your doctor what activities are safe for you. You may have to avoid lifting. Ask your provider how much you can safely lift. If you lift things: Bend your knees. Keep the weight close to your body. Avoid twisting. Medicines Take over-the-counter and prescription medicines only as told by your doctor. You may need to take medicines for pain and swelling. These may be taken by mouth or put on the skin. You may also be given muscle relaxants. Ask your doctor if the medicine prescribed to you: Requires you to avoid driving or using machinery. Can cause trouble pooping (constipation). You may need to take these actions to prevent or treat trouble pooping: Drink enough fluid to keep your pee (urine) pale yellow. Take  over-the-counter or prescription medicines. Eat foods that are high in fiber. These include beans, whole grains, and fresh fruits and vegetables. Limit foods that are high in fat and sugars. These include fried or sweet foods. General instructions  Sleep on a firm mattress. Try lying on your side with your knees slightly bent. If you lie on your back, put a pillow under your knees. Do not smoke or use any products that contain nicotine or tobacco. If you need help quitting, ask your doctor. Contact a doctor if: Your pain does not get better with rest or medicine. You have new pain. You have a fever. You lose weight quickly. You have trouble doing your normal activities. One or both of your legs or feet feel weak. One or both of your legs or feet lose feeling (have numbness). Get help right away if: You are not able  to control when you pee or poop. You have bad back pain and: You feel like you may vomit (nauseous). You vomit. You have pain in your chest or your belly (abdomen). You have shortness of breath. You faint. These symptoms may be an emergency. Get help right away. Call 911. Do not wait to see if the symptoms will go away. Do not drive yourself to the hospital. This information is not intended to replace advice given to you by your health care provider. Make sure you discuss any questions you have with your health care provider. Document Revised: 04/13/2022 Document Reviewed: 04/13/2022 Elsevier Patient Education  2024 Elsevier Inc.    Emil Schaumann, MD [facility]    [1]  Allergies Allergen Reactions   Penicillins     REACTION: passed out

## 2024-10-04 NOTE — Assessment & Plan Note (Signed)
 Chronic with acute exacerbation Mechanical in nature. Clinically stable.  No red flag signs or symptoms Secondary to lumbar degenerative disc disease Pain management discussed Recommend meloxicam  50 mg daily for 10 days Tramadol  for severe pain Flexeril  10 mg at bedtime

## 2024-10-04 NOTE — Patient Instructions (Signed)
 Chronic Back Pain Chronic back pain is back pain that lasts longer than 3 months. The pain may get worse at certain times (flare-ups). There are things you can do at home to manage your pain. Follow these instructions at home: Watch for any changes in your symptoms. Take these actions to help with your pain: Managing pain and stiffness     If told, put ice on the painful area. You may be told to use ice for 24-48 hours after a flare-up starts. Put ice in a plastic bag. Place a towel between your skin and the bag. Leave the ice on for 20 minutes, 2-3 times a day. If told, put heat on the painful area. Do this as often as told by your doctor. Use the heat source that your doctor recommends, such as a moist heat pack or a heating pad. Place a towel between your skin and the heat source. Leave the heat on for 20-30 minutes. If your skin turns bright red, take off the ice or heat right away to prevent skin damage. The risk of damage is higher if you cannot feel pain, heat, or cold. Soak in a warm bath. This can help with pain. Activity        Avoid bending and other activities that make the pain worse. When you stand: Keep your upper back and neck straight. Keep your shoulders pulled back. Avoid slouching. When you sit: Keep your back straight. Relax your shoulders. Do not round your shoulders or pull them backward. Do not sit or stand in one place for too long. Take short rest breaks during the day. Lying down or standing is often better than sitting. Resting can help relieve pain. When sitting or lying down for a long time, do some mild activity or stretching. This will help to prevent stiffness and pain. Get regular exercise. Ask your doctor what activities are safe for you. You may have to avoid lifting. Ask your provider how much you can safely lift. If you lift things: Bend your knees. Keep the weight close to your body. Avoid twisting. Medicines Take over-the-counter and  prescription medicines only as told by your doctor. You may need to take medicines for pain and swelling. These may be taken by mouth or put on the skin. You may also be given muscle relaxants. Ask your doctor if the medicine prescribed to you: Requires you to avoid driving or using machinery. Can cause trouble pooping (constipation). You may need to take these actions to prevent or treat trouble pooping: Drink enough fluid to keep your pee (urine) pale yellow. Take over-the-counter or prescription medicines. Eat foods that are high in fiber. These include beans, whole grains, and fresh fruits and vegetables. Limit foods that are high in fat and sugars. These include fried or sweet foods. General instructions  Sleep on a firm mattress. Try lying on your side with your knees slightly bent. If you lie on your back, put a pillow under your knees. Do not smoke or use any products that contain nicotine or tobacco. If you need help quitting, ask your doctor. Contact a doctor if: Your pain does not get better with rest or medicine. You have new pain. You have a fever. You lose weight quickly. You have trouble doing your normal activities. One or both of your legs or feet feel weak. One or both of your legs or feet lose feeling (have numbness). Get help right away if: You are not able to control when you pee  or poop. You have bad back pain and: You feel like you may vomit (nauseous). You vomit. You have pain in your chest or your belly (abdomen). You have shortness of breath. You faint. These symptoms may be an emergency. Get help right away. Call 911. Do not wait to see if the symptoms will go away. Do not drive yourself to the hospital. This information is not intended to replace advice given to you by your health care provider. Make sure you discuss any questions you have with your health care provider. Document Revised: 04/13/2022 Document Reviewed: 04/13/2022 [employer] Patient Education   2024 [employer].

## 2024-10-04 NOTE — Assessment & Plan Note (Signed)
 Pain management discussed Lumbar spine MRI report from 2024 reviewed with patient Needs orthopedic evaluation Referral placed today

## 2024-10-18 ENCOUNTER — Ambulatory Visit: Admitting: Physical Medicine and Rehabilitation

## 2024-11-20 ENCOUNTER — Ambulatory Visit: Admitting: Internal Medicine

## 2024-12-27 ENCOUNTER — Ambulatory Visit

## 2025-02-26 ENCOUNTER — Ambulatory Visit
